# Patient Record
Sex: Female | Born: 1960 | Race: White | Hispanic: No | Smoking: Current every day smoker
Health system: Southern US, Community
[De-identification: ages and names within clinical notes are randomized; demographics above are authoritative.]

## PROBLEM LIST (undated history)

## (undated) DIAGNOSIS — G4730 Sleep apnea, unspecified: Secondary | ICD-10-CM

## (undated) DIAGNOSIS — R519 Headache, unspecified: Secondary | ICD-10-CM

## (undated) DIAGNOSIS — M1712 Unilateral primary osteoarthritis, left knee: Secondary | ICD-10-CM

## (undated) DIAGNOSIS — F32A Depression, unspecified: Secondary | ICD-10-CM

## (undated) DIAGNOSIS — K219 Gastro-esophageal reflux disease without esophagitis: Secondary | ICD-10-CM

## (undated) DIAGNOSIS — I10 Essential (primary) hypertension: Secondary | ICD-10-CM

## (undated) DIAGNOSIS — J45909 Unspecified asthma, uncomplicated: Secondary | ICD-10-CM

## (undated) DIAGNOSIS — F4310 Post-traumatic stress disorder, unspecified: Secondary | ICD-10-CM

## (undated) DIAGNOSIS — G43909 Migraine, unspecified, not intractable, without status migrainosus: Secondary | ICD-10-CM

## (undated) DIAGNOSIS — S8992XA Unspecified injury of left lower leg, initial encounter: Secondary | ICD-10-CM

## (undated) DIAGNOSIS — F329 Major depressive disorder, single episode, unspecified: Secondary | ICD-10-CM

## (undated) DIAGNOSIS — Z8719 Personal history of other diseases of the digestive system: Secondary | ICD-10-CM

## (undated) DIAGNOSIS — F1010 Alcohol abuse, uncomplicated: Secondary | ICD-10-CM

## (undated) DIAGNOSIS — R51 Headache: Secondary | ICD-10-CM

## (undated) DIAGNOSIS — F419 Anxiety disorder, unspecified: Secondary | ICD-10-CM

## (undated) HISTORY — PX: SPLENECTOMY, TOTAL: SHX788

## (undated) HISTORY — PX: BREAST BIOPSY: SHX20

## (undated) HISTORY — PX: OTHER SURGICAL HISTORY: SHX169

## (undated) HISTORY — DX: Headache, unspecified: R51.9

## (undated) HISTORY — DX: Headache: R51

## (undated) HISTORY — DX: Post-traumatic stress disorder, unspecified: F43.10

---
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
## 1982-08-26 HISTORY — PX: TUBAL LIGATION: SHX77

## 2000-08-17 ENCOUNTER — Inpatient Hospital Stay (HOSPITAL_COMMUNITY): Admission: EM | Admit: 2000-08-17 | Discharge: 2000-09-01 | Payer: Self-pay

## 2000-08-17 ENCOUNTER — Encounter (INDEPENDENT_AMBULATORY_CARE_PROVIDER_SITE_OTHER): Payer: Self-pay | Admitting: Specialist

## 2000-08-19 ENCOUNTER — Encounter: Payer: Self-pay | Admitting: Surgery

## 2000-08-24 HISTORY — PX: OTHER SURGICAL HISTORY: SHX169

## 2002-03-23 ENCOUNTER — Emergency Department (HOSPITAL_COMMUNITY): Admission: EM | Admit: 2002-03-23 | Discharge: 2002-03-23 | Payer: Self-pay | Admitting: Emergency Medicine

## 2002-03-23 ENCOUNTER — Encounter: Payer: Self-pay | Admitting: Emergency Medicine

## 2002-03-24 ENCOUNTER — Emergency Department (HOSPITAL_COMMUNITY): Admission: EM | Admit: 2002-03-24 | Discharge: 2002-03-24 | Payer: Self-pay | Admitting: Emergency Medicine

## 2004-09-03 ENCOUNTER — Emergency Department (HOSPITAL_COMMUNITY): Admission: EM | Admit: 2004-09-03 | Discharge: 2004-09-03 | Payer: Self-pay | Admitting: Emergency Medicine

## 2005-07-06 ENCOUNTER — Emergency Department (HOSPITAL_COMMUNITY): Admission: EM | Admit: 2005-07-06 | Discharge: 2005-07-07 | Payer: Self-pay | Admitting: Emergency Medicine

## 2006-01-01 ENCOUNTER — Emergency Department (HOSPITAL_COMMUNITY): Admission: EM | Admit: 2006-01-01 | Discharge: 2006-01-01 | Payer: Self-pay | Admitting: Emergency Medicine

## 2006-01-04 ENCOUNTER — Emergency Department (HOSPITAL_COMMUNITY): Admission: EM | Admit: 2006-01-04 | Discharge: 2006-01-04 | Payer: Self-pay | Admitting: Emergency Medicine

## 2006-08-11 ENCOUNTER — Emergency Department (HOSPITAL_COMMUNITY): Admission: EM | Admit: 2006-08-11 | Discharge: 2006-08-11 | Payer: Self-pay | Admitting: Emergency Medicine

## 2006-11-26 DIAGNOSIS — Z9189 Other specified personal risk factors, not elsewhere classified: Secondary | ICD-10-CM

## 2006-12-02 DIAGNOSIS — T7411XA Adult physical abuse, confirmed, initial encounter: Secondary | ICD-10-CM | POA: Insufficient documentation

## 2007-03-07 ENCOUNTER — Emergency Department (HOSPITAL_COMMUNITY): Admission: EM | Admit: 2007-03-07 | Discharge: 2007-03-07 | Payer: Self-pay | Admitting: Emergency Medicine

## 2007-05-17 ENCOUNTER — Emergency Department (HOSPITAL_COMMUNITY): Admission: EM | Admit: 2007-05-17 | Discharge: 2007-05-17 | Payer: Self-pay | Admitting: Emergency Medicine

## 2007-06-23 ENCOUNTER — Emergency Department (HOSPITAL_COMMUNITY): Admission: EM | Admit: 2007-06-23 | Discharge: 2007-06-23 | Payer: Self-pay | Admitting: Emergency Medicine

## 2007-08-10 ENCOUNTER — Ambulatory Visit: Payer: Self-pay | Admitting: Nurse Practitioner

## 2007-08-10 DIAGNOSIS — F4310 Post-traumatic stress disorder, unspecified: Secondary | ICD-10-CM

## 2007-08-10 DIAGNOSIS — K029 Dental caries, unspecified: Secondary | ICD-10-CM | POA: Insufficient documentation

## 2007-09-01 ENCOUNTER — Ambulatory Visit: Payer: Self-pay | Admitting: Nurse Practitioner

## 2007-09-01 LAB — CONVERTED CEMR LAB
ALT: 12 units/L (ref 0–35)
Basophils Absolute: 0 10*3/uL (ref 0.0–0.1)
Blood in Urine, dipstick: NEGATIVE
CO2: 25 meq/L (ref 19–32)
Calcium: 9.2 mg/dL (ref 8.4–10.5)
Chlamydia, DNA Probe: NEGATIVE
Chloride: 105 meq/L (ref 96–112)
Cholesterol: 187 mg/dL (ref 0–200)
Creatinine, Ser: 0.81 mg/dL (ref 0.40–1.20)
GC Probe Amp, Genital: NEGATIVE
Glucose, Bld: 96 mg/dL (ref 70–99)
Hemoglobin: 12.7 g/dL (ref 12.0–15.0)
Ketones, urine, test strip: NEGATIVE
Lymphocytes Relative: 34 % (ref 12–46)
Lymphs Abs: 2.1 10*3/uL (ref 0.7–4.0)
Monocytes Absolute: 0.7 10*3/uL (ref 0.1–1.0)
Monocytes Relative: 11 % (ref 3–12)
Neutro Abs: 3.3 10*3/uL (ref 1.7–7.7)
Nitrite: NEGATIVE
Protein, U semiquant: NEGATIVE
RBC: 4.11 M/uL (ref 3.87–5.11)
RDW: 13.1 % (ref 11.5–15.5)
Total CHOL/HDL Ratio: 2.4
Total Protein: 7.1 g/dL (ref 6.0–8.3)
Urobilinogen, UA: 0.2
WBC Urine, dipstick: NEGATIVE
WBC: 6.2 10*3/uL (ref 4.0–10.5)

## 2007-09-04 ENCOUNTER — Encounter (INDEPENDENT_AMBULATORY_CARE_PROVIDER_SITE_OTHER): Payer: Self-pay | Admitting: Nurse Practitioner

## 2007-09-07 ENCOUNTER — Ambulatory Visit (HOSPITAL_COMMUNITY): Admission: RE | Admit: 2007-09-07 | Discharge: 2007-09-07 | Payer: Self-pay | Admitting: Internal Medicine

## 2007-09-07 ENCOUNTER — Ambulatory Visit: Payer: Self-pay | Admitting: *Deleted

## 2007-10-31 ENCOUNTER — Ambulatory Visit: Payer: Self-pay | Admitting: Nurse Practitioner

## 2007-11-30 ENCOUNTER — Emergency Department (HOSPITAL_COMMUNITY): Admission: EM | Admit: 2007-11-30 | Discharge: 2007-11-30 | Payer: Self-pay | Admitting: *Deleted

## 2007-12-14 ENCOUNTER — Emergency Department (HOSPITAL_COMMUNITY): Admission: EM | Admit: 2007-12-14 | Discharge: 2007-12-14 | Payer: Self-pay | Admitting: Emergency Medicine

## 2008-03-20 ENCOUNTER — Ambulatory Visit: Payer: Self-pay | Admitting: Nurse Practitioner

## 2008-03-20 DIAGNOSIS — J029 Acute pharyngitis, unspecified: Secondary | ICD-10-CM

## 2009-03-30 ENCOUNTER — Emergency Department (HOSPITAL_COMMUNITY): Admission: EM | Admit: 2009-03-30 | Discharge: 2009-03-30 | Payer: Self-pay | Admitting: Emergency Medicine

## 2009-09-03 ENCOUNTER — Ambulatory Visit: Payer: Self-pay | Admitting: Nurse Practitioner

## 2009-09-03 DIAGNOSIS — N760 Acute vaginitis: Secondary | ICD-10-CM | POA: Insufficient documentation

## 2009-09-03 LAB — CONVERTED CEMR LAB
Chlamydia, DNA Probe: NEGATIVE
GC Probe Amp, Genital: NEGATIVE

## 2009-09-04 ENCOUNTER — Encounter (INDEPENDENT_AMBULATORY_CARE_PROVIDER_SITE_OTHER): Payer: Self-pay | Admitting: Nurse Practitioner

## 2009-09-09 ENCOUNTER — Emergency Department (HOSPITAL_COMMUNITY): Admission: EM | Admit: 2009-09-09 | Discharge: 2009-09-09 | Payer: Self-pay | Admitting: Emergency Medicine

## 2009-10-29 ENCOUNTER — Ambulatory Visit: Payer: Self-pay | Admitting: Physician Assistant

## 2009-10-30 ENCOUNTER — Telehealth: Payer: Self-pay | Admitting: Physician Assistant

## 2009-12-15 ENCOUNTER — Telehealth (INDEPENDENT_AMBULATORY_CARE_PROVIDER_SITE_OTHER): Payer: Self-pay | Admitting: Nurse Practitioner

## 2010-01-13 ENCOUNTER — Ambulatory Visit (HOSPITAL_COMMUNITY)
Admission: RE | Admit: 2010-01-13 | Discharge: 2010-01-13 | Payer: Self-pay | Source: Home / Self Care | Attending: Nurse Practitioner | Admitting: Nurse Practitioner

## 2010-01-13 ENCOUNTER — Ambulatory Visit: Payer: Self-pay | Admitting: Nurse Practitioner

## 2010-01-13 DIAGNOSIS — M25569 Pain in unspecified knee: Secondary | ICD-10-CM

## 2010-01-14 ENCOUNTER — Telehealth (INDEPENDENT_AMBULATORY_CARE_PROVIDER_SITE_OTHER): Payer: Self-pay | Admitting: Nurse Practitioner

## 2010-02-04 ENCOUNTER — Telehealth (INDEPENDENT_AMBULATORY_CARE_PROVIDER_SITE_OTHER): Payer: Self-pay | Admitting: Nurse Practitioner

## 2010-02-13 ENCOUNTER — Encounter (INDEPENDENT_AMBULATORY_CARE_PROVIDER_SITE_OTHER): Payer: Self-pay | Admitting: Nurse Practitioner

## 2010-02-13 ENCOUNTER — Ambulatory Visit
Admission: RE | Admit: 2010-02-13 | Discharge: 2010-02-13 | Payer: Self-pay | Source: Home / Self Care | Attending: Nurse Practitioner | Admitting: Nurse Practitioner

## 2010-02-13 DIAGNOSIS — J329 Chronic sinusitis, unspecified: Secondary | ICD-10-CM | POA: Insufficient documentation

## 2010-02-20 ENCOUNTER — Telehealth (INDEPENDENT_AMBULATORY_CARE_PROVIDER_SITE_OTHER): Payer: Self-pay | Admitting: Nurse Practitioner

## 2010-02-24 NOTE — Progress Notes (Signed)
Summary: Still having a lot of pain  Phone Note Call from Patient Call back at [phone]   Caller: Patient Summary of Call: PT IS STILL IN ALOT OF PAIN WANTED TO KNOW IF THEY CAN CALL SOMETHING IN FOR HER [facility] [address] Initial call taken by: Oscar La,  October 30, 2009 11:28 AM  Follow-up for Phone Call        Where is the pain?  Sore throat? Tereso Newcomer PA-C  October 30, 2009 1:01 PM  The pain is all over -- thinks it's the flu.  Having headaches, throat pain, having trouble sleeping due to body pain.  Has to go back to work and is taking 3 ibuprofen at a time, giving little relief.  Would like a return call if something is called in.   Dutch Quint RN  October 30, 2009 4:49 PM   Additional Follow-up for Phone Call Additional follow up Details #1::        Naprosyn sent to [facility]. She can take it two times a day with food. Do not take with ibuprofen. She can take Tylenol [phone] mg by mouth every 6 hours as needed with the naprosyn. Drink plenty of fluids. Take all of the antibiotics. Get plenty of rest. It will take her 7-10 days to feel better. Schedule appt if she is getting worse.  Additional Follow-up by: Tereso Newcomer PA-C,  October 30, 2009 5:18 PM    Additional Follow-up for Phone Call Additional follow up Details #2::    Left message on answering machine for pt. to return call.  Dutch Quint RN  October 31, 2009 12:27 PM  Left message on answering machine for pt. to return call.  Dutch Quint RN  November 04, 2009 12:03 PM  Left message on answering machine for pt. to return call.  Dutch Quint RN  November 05, 2009 12:23 PM  Feeling better, symptoms resolving.  Notified of Rx and provider's instructions.  Dutch Quint RN  November 06, 2009 11:07 AM   New/Updated Medications: NAPROSYN 500 MG TABS (NAPROXEN) Take 1 tablet by mouth two times a day with food as needed for pain Prescriptions: NAPROSYN 500 MG TABS (NAPROXEN) Take 1 tablet by mouth two  times a day with food as needed for pain  #30 x 0   Entered and Authorized by:   Tereso Newcomer PA-C   Signed by:   Tereso Newcomer PA-C on 10/30/2009   Method used:   Electronically to        [facility] [phone]* (retail)       [address]       [place], [place]  [address]       Ph: [phone]       Fax: [phone]   RxID:   [id number]
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
## 2010-02-24 NOTE — Assessment & Plan Note (Signed)
Summary: Acute - Vaginitis   Vital Signs:  Patient profile:   50 year old female LMP:     2009-09-06 Weight:      196.0 pounds BMI:     32.73 Temp:     97.7 degrees F oral Pulse rate:   51 / minute Pulse rhythm:   regular Resp:     16 per minute BP sitting:   107 / 74  (left arm) Cuff size:   regular  Vitals Entered By: Levon Hedger (September 03, 2009 4:13 PM)  Nutrition Counseling: Patient's BMI is greater than 25 and therefore counseled on weight management options. CC: wantds to be tested for STD....has a rash in between legs with alot of pain and burning Is Patient Diabetic? No Pain Assessment Patient in pain? no       Does patient need assistance? Functional Status Self care Ambulation Normal LMP (date): 2009/09/06 LMP - Character: light     Enter LMP: 06-Sep-2009 Last PAP Result negative   CC:  wantds to be tested for STD....has a rash in between legs with alot of pain and burning.  History of Present Illness:  Pt into the office for f/u on rash on her vaginal area. Pt reports that she has been exposed on two separate occasions with irritants that may have caused a rash Symtpoms started 3 days ago No vaginal discharge only burning and irritations Pt reports that she went to in the woods for toileting while she was helping a friend work.  Also she used some "lavender" body wash that her daughter had in the home.  She is unable to use soap as it is a know irritant.  Social - pt has not been to this office since 02/2008. She reports she was helping to care for her father who deceased in 09/06/2008 and then his wife who later died.  Pt is a recovering addict and admits that following the death of her father she "slipped" but now she is clean and sober again. Admits to some promiscuous behavior and is requesting STD testing  Allergies (verified): No Known Drug Allergies  Review of Systems CV:  Denies chest pain or discomfort. Resp:  Denies cough. GI:  Denies abdominal pain,  nausea, and vomiting. GU:  vaginal irritation.  Physical Exam  General:  alert.   Head:  normocephalic.   Lungs:  normal breath sounds.   Heart:  normal rate and regular rhythm.   Genitalia:  bilateral inner thighs - upper  macular rash vagina - normal examination Msk:  up to the exam table Neurologic:  alert & oriented X3.     Impression & Recommendations:  Problem # 1:  VAGINITIS (ICD-616.10) advised pt not to use body wash in vaginal area area is self resolving  advised pt that she can apply hydrocortisone cream if needed  Problem # 2:  SCREENING EXAMINATION FOR VENEREAL DISEASE (ICD-V74.5) per pt request  she does also need PAP Orders: T- GC Chlamydia (40347)  Patient Instructions: 1)  Schedule an appointment for a complete physical exam 2)  Come fasting after midnight before this visit. 3)  Vaginal irritation - most likely from body wash that you used. Avoid the body wash.  This should clear in a few days.  4)  If you would like to apply something you can use hydrocortisone cream which can purchased over the counter

## 2010-02-24 NOTE — Progress Notes (Signed)
Summary: NEED TO SPEAK WITH THE NURSE  Phone Note Call from Patient   Summary of Call: PATIENT HAS APP ON 01-28-10 TO SEE N.MARTIN SHE IS HAVING PROBLEMS WITH DIZZINESS   AND ALL SPIN AROUD AND SHE NEEDS TO TALK TO THE NURSE TO SEE WHAT SHE CAN DOO. Initial call taken by: Domenic Polite,  December 15, 2009 11:41 AM  Follow-up for Phone Call        Daughter said pt. is out running errands and call her on her cell phone [phone]. Called Left message on answer machine for pt. to return call. Gaylyn Cheers RN  December 15, 2009 12:29 PM   Left message on answer machine for pt. to return call. Gaylyn Cheers RN  December 16, 2009 10:26 AM       Additional Follow-up for Phone Call Additional follow up Details #1::        Last Wed. when she got out of bed lightheaded and dizzy, stumbled to bathrm. Lasted app 1hr. Has reoccured daily since then. Feels congested, denies any drainage, no fever no sore throat, no HA, no hx of BP problems however normally runs low. Advised to check BP @ drug store  runs fine.  If over 150/90 or lower than 90/50 call and we can have her come for BP check. If BP is ok then try an OTC decongestant. Saline nasal spray, hot showere with water directed @ sinus areas. Call if develops a fever feels worse.  Additional Follow-up by: Gaylyn Cheers RN,  December 16, 2009 11:18 AM

## 2010-02-24 NOTE — Letter (Signed)
Summary: *HSN Results Follow up  [facility]  [address] [place], [place] [address]   Phone: [phone]  Fax: [phone]      09/04/2009   Meghan Bowman [address] [place], [place]  [address]   Dear  Ms. Meghan Bowman,                            ____S.Drinkard,FNP   ____D. Gore,FNP       ____B. McPherson,MD   ____V. Rankins,MD    ____E. Mulberry,MD    _X___N. Daphine Deutscher, FNP  ____D. Reche [place], MD    ____K. Philipp Deputy, MD    ____Other     This letter is to inform you that your recent test(s):  _______Pap Smear    ___X____Lab Test     _______X-ray    ___X____ is within acceptable limits  _______ requires a medication change  _______ requires a follow-up lab visit  _______ requires a follow-up visit with your provider   Comments: Labs done during recent office visit were normal.       _________________________________________________________ If you have any questions, please contact our office [phone].                    Sincerely,    Lehman Prom FNP [facility]

## 2010-02-26 NOTE — Letter (Signed)
Summary: Handout Printed  Printed Handout:  - Sinusitis 

## 2010-02-26 NOTE — Progress Notes (Signed)
Summary: X-ray results  Phone Note Call from Patient   Summary of Call: notify pt that her x-ray shows pseudogout which is formation of salt crystals in the joints. symptoms should be controlled with ibuprofen as ordered during last visit. (will send a note about dx) may also benefit from aspiration of fluid if it gets more extreme Initial call taken by: Lehman Prom FNP,  January 14, 2010 5:49 PM  Follow-up for Phone Call        Left message on answering machine for pt to call back......Marland KitchenArmenia Shannon  January 16, 2010 10:13 AM    pt wants to know can she have antiinflammatory med.Marland KitchenMarland KitchenArmenia Shannon  January 16, 2010 11:22 AM   spoke with Daphine Deutscher and she let me know that is what the ibuprofen... [place] Shannon  January 16, 2010 11:57 AM
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
## 2010-02-27 ENCOUNTER — Encounter (INDEPENDENT_AMBULATORY_CARE_PROVIDER_SITE_OTHER): Payer: Self-pay | Admitting: Nurse Practitioner

## 2010-03-02 ENCOUNTER — Telehealth (INDEPENDENT_AMBULATORY_CARE_PROVIDER_SITE_OTHER): Payer: Self-pay | Admitting: Nurse Practitioner
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
## 2010-03-04 NOTE — Letter (Signed)
Summary: Generic Letter  [facility]  [address] [place], [place] [address]   Phone: [phone]  Fax: [phone]    02/27/2010  Tameria Labo [address] [place], [place]  [address]  Dear Ms. Jerene Pitch,  We have been unable to contact you by telephone.  Please call our office, at your earliest convenience, so that we may speak with you.   Sincerely,   Dutch Quint RN

## 2010-03-12 NOTE — Progress Notes (Signed)
Summary: F/U Anxiety  Phone Note Call from Patient Call back at  [phone]   Summary of Call: PT HAD TO CANCEL CPP FOR TODAY BECAUSE HER CARD EXPIRED AND SHE DID NOT HAVE THE MONEY TO BE SEEN/ WILL CALL BACK IN MARCH.SHE WANTED [facility] TO KNOW SHE WAS DOING BETTER,SHE IS WORKING WITH THERAPIST TO GET  ON MEDS/NERVES ARE ALITTLE BETTER SHE HAS LEARN TO SLOW DOWN.SHE APPRECIATES ALL THAT YOU  DO FOR HER/PHONE NUMBERS DAUGHTER=[phone] HER NUMBER [phone] Initial call taken by: Arta Bruce,  March 02, 2010 8:53 AM  Follow-up for Phone Call        Sent to N. Daphine Deutscher for [employer].  Dutch Quint RN  March 02, 2010 11:49 AM   Additional Follow-up for Phone Call Additional follow up Details #1::        great to hear that pt is doing better. I'll be glad to see her for a CPE once her card is renewed Additional Follow-up by: Lehman Prom FNP,  March 02, 2010 1:20 PM

## 2010-04-09 LAB — DIFFERENTIAL
Basophils Relative: 1 % (ref 0–1)
Eosinophils Absolute: 0.3 10*3/uL (ref 0.0–0.7)
Neutrophils Relative %: 46 % (ref 43–77)

## 2010-04-09 LAB — URINALYSIS, ROUTINE W REFLEX MICROSCOPIC
Bilirubin Urine: NEGATIVE
Leukocytes, UA: NEGATIVE
Nitrite: NEGATIVE
Urobilinogen, UA: 0.2 mg/dL (ref 0.0–1.0)

## 2010-04-09 LAB — CBC
MCH: 31.3 pg (ref 26.0–34.0)
MCHC: 33.8 g/dL (ref 30.0–36.0)
Platelets: 249 10*3/uL (ref 150–400)

## 2010-04-09 LAB — BASIC METABOLIC PANEL
CO2: 23 mEq/L (ref 19–32)
Calcium: 8.9 mg/dL (ref 8.4–10.5)
Creatinine, Ser: 0.75 mg/dL (ref 0.4–1.2)
Glucose, Bld: 123 mg/dL — ABNORMAL HIGH (ref 70–99)

## 2010-04-09 LAB — URINE MICROSCOPIC-ADD ON

## 2010-04-09 LAB — POCT CARDIAC MARKERS
CKMB, poc: <1 ng/mL — ABNORMAL LOW (ref 1.0–8.0)
Myoglobin, poc: 35.1 ng/mL (ref 12–200)
Troponin i, poc: <0.05 ng/mL (ref 0.00–0.09)

## 2010-04-17 LAB — COMPREHENSIVE METABOLIC PANEL
Albumin: 4.3 g/dL (ref 3.5–5.2)
Alkaline Phosphatase: 61 U/L (ref 39–117)
BUN: 8 mg/dL (ref 6–23)
Calcium: 9 mg/dL (ref 8.4–10.5)
Glucose, Bld: 118 mg/dL — ABNORMAL HIGH (ref 70–99)
Potassium: 4.3 mEq/L (ref 3.5–5.1)
Sodium: 137 mEq/L (ref 135–145)
Total Protein: 7.5 g/dL (ref 6.0–8.3)

## 2010-04-17 LAB — CBC
HCT: 40.5 % (ref 36.0–46.0)
Hemoglobin: 13.7 g/dL (ref 12.0–15.0)
MCHC: 33.8 g/dL (ref 30.0–36.0)
Platelets: 351 10*3/uL (ref 150–400)
RDW: 13.4 % (ref 11.5–15.5)

## 2010-04-17 LAB — URINE MICROSCOPIC-ADD ON

## 2010-04-17 LAB — RAPID URINE DRUG SCREEN, HOSP PERFORMED
Benzodiazepines: NOT DETECTED
Cocaine: POSITIVE — AB
Opiates: NOT DETECTED

## 2010-04-17 LAB — DIFFERENTIAL
Lymphs Abs: 2.5 10*3/uL (ref 0.7–4.0)
Monocytes Absolute: 0.5 10*3/uL (ref 0.1–1.0)
Monocytes Relative: 6 % (ref 3–12)
Neutro Abs: 5.2 10*3/uL (ref 1.7–7.7)
Neutrophils Relative %: 60 % (ref 43–77)

## 2010-04-17 LAB — URINALYSIS, ROUTINE W REFLEX MICROSCOPIC
Glucose, UA: NEGATIVE mg/dL
Protein, ur: 30 mg/dL — AB
pH: 5 (ref 5.0–8.0)

## 2010-04-17 LAB — ETHANOL: Alcohol, Ethyl (B): 77 mg/dL — ABNORMAL HIGH (ref 0–10)

## 2010-05-04 ENCOUNTER — Other Ambulatory Visit (HOSPITAL_COMMUNITY): Payer: Self-pay | Admitting: Internal Medicine

## 2010-05-04 DIAGNOSIS — Z1231 Encounter for screening mammogram for malignant neoplasm of breast: Secondary | ICD-10-CM

## 2010-05-05 ENCOUNTER — Ambulatory Visit (HOSPITAL_COMMUNITY)
Admission: RE | Admit: 2010-05-05 | Discharge: 2010-05-05 | Disposition: A | Discharge status: Home / Self Care | Payer: Medicaid Other | Source: Ambulatory Visit | Attending: Internal Medicine | Admitting: Internal Medicine

## 2010-05-05 DIAGNOSIS — Z1231 Encounter for screening mammogram for malignant neoplasm of breast: Secondary | ICD-10-CM | POA: Insufficient documentation
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
## 2010-06-12 NOTE — Op Note (Signed)
[facility]  Patient:    Meghan Bowman, Meghan Bowman                       MRN: [id number] Proc. Date: 08/24/00 Adm. Date:  [id number] Attending:  Katha Cabal CC:         Vania Rea. Jarold Motto, M.D. [facility]   Operative Report  PREOPERATIVE DIAGNOSIS:  Abscess in distal ileum.  POSTOPERATIVE DIAGNOSIS:  Abscess in distal ileum.  PROCEDURE:  Ileal cecectomy.  SURGEON:  Thornton Park. Daphine Deutscher, M.D.  ASSISTANT:  Chevis Pretty, M.D.  ANESTHESIA:  General endotracheal.  FINDINGS:  Possible ileal perforation with partial small-bowel obstruction.  DESCRIPTION OF PROCEDURE:  The patient was taken to room #1 on the afternoon of August 24, 2000 and given general anesthesia.  She has been on Primaxin. Abdomen was prepped with Betadine and draped sterilely.  A Foley was inserted. A midline incision was made and it went through her old incision from her previous splenectomy.  I explored her abdomen and went up and found her ligament of Treitz and ran the small bowel down distally to where it was bound into this abscess.  I broke into the abscess, cultured the material and suctioned this out and mobilized the distal ileum.  It appeared to be bound in on an interloop abscess and this was mobilized along with the cecum.  There was no frank pus that was really encountered but there was an old abscess cavity ______  was drained out with my suction tip but there really was no spillage.  After mobilizing this and mobilizing the right colon, I found the appendix and it looked normal and there was no evidence of a ruptured appendix.  I went ahead and divided the ascending colon and went through the mesentery, putting clamps in the mesentery and oversewing with 2-0 silk ties. The terminal ileum was not involved and the abscess was divided and then the two stapled ends were placed together and a functional end-to-end anastomosis was created using the 7.5 linear stapler and the common  defect was closed with a TA-60.  Good healthy anastomosis was present.  Prior to closure, I did inspect and there was no active bleeding.  Good patent ileoascending anastomosis was present.  Mesentery was essentially closed, since I had resected right along the bowel.  Area was irrigated copiously with saline. About 400 cc of blood had been lost in the case.  A JP drain was placed in this ascending colonic abscess gutter and was brought out through a separate stab incision in the right lower quadrant.  Sponge and needle counts were reported as correct.  The wound was then closed with a running #1 Prolene from above and below and double antibiotic solution was used and then wound was stapled.  The patient tolerated the procedure well and was taken to recovery room in satisfactory condition. DD:  08/24/00 TD:  08/25/00 Job: [id number]

## 2010-06-12 NOTE — Discharge Summary (Signed)
[facility]  Patient:    Meghan Bowman, Meghan Bowman                       MRN: [id number] Proc. Date: 09/01/00 Adm. Date:  [id number] Disc. Date: 09/01/00 Attending:  Katha Cabal CC:         Vania Rea. Jarold Motto, M.D. [facility]   Discharge Summary  ADMITTING DIAGNOSIS:  Inflammatory mass in ileum.  DISCHARGE DIAGNOSIS:  Probable perforation of ileum, questionable foreign body.  PROCEDURE:  Laparotomy with resection of small bowel and primary anastomosis on August 24, 2000.  HOSPITAL COURSE:  Ms. Fodge was admitted and through the ER was found on CT scan to have this thickened area of the distal ileum surrounded with gas, evidence of a perforation.  Dr. Jarold Motto saw her regarding whether this could possibly be inflammatory bowel disease and did not feel that that was the case.  She was treated with Unasyn and got better, ultimately switching over to Primaxin. She cooled down but because this had been persistent, we elected to go to the operating room on July 31.  There, terminal ileum and a cecectomy was performed taking out her appendix and cecum and the terminal ileum.  A primary anastomosis was performed.  Postoperatively she did reasonably well and was ready to be discharged on postoperative day eight.  DISCHARGE MEDICATION/FOLLOW-UP:  She was given Tylox (#30) to take for pain and will be followed up in the office in about 2-3 weeks. DD:  09/01/00 TD:  09/01/00 Job: [id number]

## 2010-09-18 ENCOUNTER — Emergency Department (HOSPITAL_COMMUNITY): Payer: Medicaid Other

## 2010-09-18 ENCOUNTER — Emergency Department (HOSPITAL_COMMUNITY)
Admission: EM | Admit: 2010-09-18 | Discharge: 2010-09-18 | Disposition: A | Discharge status: Home / Self Care | Payer: Medicaid Other | Attending: Emergency Medicine | Admitting: Emergency Medicine

## 2010-09-18 DIAGNOSIS — M545 Low back pain, unspecified: Secondary | ICD-10-CM | POA: Insufficient documentation

## 2010-09-18 DIAGNOSIS — M25559 Pain in unspecified hip: Secondary | ICD-10-CM | POA: Insufficient documentation

## 2010-10-06 ENCOUNTER — Emergency Department (HOSPITAL_COMMUNITY)
Admission: EM | Admit: 2010-10-06 | Discharge: 2010-10-06 | Disposition: A | Discharge status: Home / Self Care | Payer: Medicaid Other | Attending: Emergency Medicine | Admitting: Emergency Medicine

## 2010-10-06 DIAGNOSIS — M79609 Pain in unspecified limb: Secondary | ICD-10-CM | POA: Insufficient documentation

## 2010-10-06 DIAGNOSIS — M545 Low back pain, unspecified: Secondary | ICD-10-CM | POA: Insufficient documentation

## 2010-10-06 DIAGNOSIS — L089 Local infection of the skin and subcutaneous tissue, unspecified: Secondary | ICD-10-CM | POA: Insufficient documentation

## 2010-10-27 LAB — POCT I-STAT, CHEM 8
BUN: 7
Calcium, Ion: 1.16
Chloride: 105
Creatinine, Ser: 0.9
Glucose, Bld: 99
HCT: 36
Potassium: 3.9

## 2010-11-09 LAB — URINALYSIS, ROUTINE W REFLEX MICROSCOPIC
Glucose, UA: NEGATIVE
Hgb urine dipstick: NEGATIVE
Protein, ur: NEGATIVE
Specific Gravity, Urine: 1.037 — ABNORMAL HIGH
pH: 5.5

## 2011-01-21 ENCOUNTER — Emergency Department (HOSPITAL_COMMUNITY)
Admission: EM | Admit: 2011-01-21 | Discharge: 2011-01-21 | Disposition: A | Discharge status: Home / Self Care | Payer: Medicaid Other | Source: Home / Self Care | Attending: Emergency Medicine | Admitting: Emergency Medicine

## 2011-01-21 DIAGNOSIS — Z2089 Contact with and (suspected) exposure to other communicable diseases: Secondary | ICD-10-CM

## 2011-01-21 MED ORDER — PERMETHRIN 5 % EX CREA: TOPICAL_CREAM | CUTANEOUS | Status: AC

## 2011-01-21 MED ORDER — HYDROXYZINE HCL 25 MG PO TABS: 25.0000 mg | ORAL_TABLET | Freq: Four times a day (QID) | ORAL | Status: AC

## 2011-01-21 NOTE — ED Notes (Signed)
C/o itchy rash all over, including scalp for more than a week.  States other family members diagnosed with scabies.

## 2011-01-21 NOTE — ED Provider Notes (Signed)
History     CSN: [id number]  Arrival date & time 01/21/11  1641   First MD Initiated Contact with Patient 01/21/11 1643      Chief Complaint  Patient presents with  . Rash    (Consider location/radiation/quality/duration/timing/severity/associated sxs/prior treatment) HPI Comments: For about 1 week, have itchiness almost everywhere, have this itchy rash on my stomach , my groin and my chest"  Patient is a 50 y.o. female presenting with rash. The history is provided by the patient.  Rash  This is a new problem. The current episode started yesterday. The problem has not changed since onset.There has been no fever. The rash is present on the back and torso. The pain is mild. Associated symptoms include itching. Pertinent negatives include no blisters, no pain and no weeping.    History reviewed. No pertinent past medical history.  Past Surgical History  Procedure Date  . Splenectomy, total     No family history on file.  History  Substance Use Topics  . Smoking status: Never Smoker   . Smokeless tobacco: Not on file  . Alcohol Use: Yes     social    OB History    Grav Para Term Preterm Abortions TAB SAB Ect Mult Living                  Review of Systems  Constitutional: Negative for fever and fatigue.  Musculoskeletal: Positive for gait problem.  Skin: Positive for itching and rash.    Allergies  Review of patient's allergies indicates no known allergies.  Home Medications   Current Outpatient Rx  Name Route Sig Dispense Refill  . HYDROXYZINE HCL 25 MG PO TABS Oral Take 1 tablet (25 mg total) by mouth every 6 (six) hours. 12 tablet 0  . PERMETHRIN 5 % EX CREA  Apply to affected area once and leave on for 8 hours repeat treatment in 14 days 60 g 0    BP 137/89  Pulse 92  Temp(Src) 98.7 F (37.1 C) (Oral)  Resp 16  SpO2 99%  Physical Exam  Nursing note and vitals reviewed. Constitutional: She appears well-developed and well-nourished.  Skin: Rash  noted. No abrasion noted. Rash is papular. She is not diaphoretic. There is erythema.       ED Course  Procedures (including critical care time)  Labs Reviewed - No data to display No results found.   1. Scabies exposure       MDM  Pruritic-papular eruption +scabies contact        Jimmie Molly, MD 01/21/11 [phone]

## 2011-02-10 ENCOUNTER — Ambulatory Visit: Payer: Medicaid Other | Attending: Nurse Practitioner | Admitting: Physical Therapy

## 2011-02-10 DIAGNOSIS — IMO0001 Reserved for inherently not codable concepts without codable children: Secondary | ICD-10-CM | POA: Insufficient documentation

## 2011-02-10 DIAGNOSIS — M545 Low back pain, unspecified: Secondary | ICD-10-CM | POA: Insufficient documentation

## 2011-02-15 ENCOUNTER — Ambulatory Visit: Payer: Medicaid Other | Admitting: Physical Therapy

## 2011-02-17 ENCOUNTER — Encounter: Payer: No Typology Code available for payment source | Admitting: Physical Therapy

## 2011-02-22 ENCOUNTER — Encounter: Payer: No Typology Code available for payment source | Admitting: Physical Therapy

## 2011-02-25 ENCOUNTER — Ambulatory Visit: Payer: Medicaid Other | Admitting: Physical Therapy

## 2011-03-03 ENCOUNTER — Encounter: Payer: No Typology Code available for payment source | Admitting: Physical Therapy

## 2011-03-05 ENCOUNTER — Encounter: Payer: No Typology Code available for payment source | Admitting: Physical Therapy

## 2011-03-08 ENCOUNTER — Encounter: Payer: No Typology Code available for payment source | Admitting: Physical Therapy

## 2011-03-11 ENCOUNTER — Encounter: Payer: No Typology Code available for payment source | Admitting: Physical Therapy

## 2011-07-05 ENCOUNTER — Encounter (HOSPITAL_COMMUNITY): Payer: Self-pay | Admitting: Emergency Medicine

## 2011-07-05 ENCOUNTER — Emergency Department (HOSPITAL_COMMUNITY)
Admission: EM | Admit: 2011-07-05 | Discharge: 2011-07-05 | Disposition: A | Discharge status: Home / Self Care | Payer: Medicaid Other | Source: Home / Self Care | Attending: Emergency Medicine | Admitting: Emergency Medicine

## 2011-07-05 ENCOUNTER — Emergency Department (HOSPITAL_COMMUNITY)
Admission: EM | Admit: 2011-07-05 | Discharge: 2011-07-06 | Disposition: A | Discharge status: Home / Self Care | Payer: Medicaid Other | Attending: Emergency Medicine | Admitting: Emergency Medicine

## 2011-07-05 ENCOUNTER — Encounter (HOSPITAL_COMMUNITY): Payer: Self-pay | Admitting: *Deleted

## 2011-07-05 DIAGNOSIS — K047 Periapical abscess without sinus: Secondary | ICD-10-CM

## 2011-07-05 DIAGNOSIS — F329 Major depressive disorder, single episode, unspecified: Secondary | ICD-10-CM | POA: Insufficient documentation

## 2011-07-05 DIAGNOSIS — F411 Generalized anxiety disorder: Secondary | ICD-10-CM | POA: Insufficient documentation

## 2011-07-05 DIAGNOSIS — L03211 Cellulitis of face: Secondary | ICD-10-CM | POA: Insufficient documentation

## 2011-07-05 DIAGNOSIS — F3289 Other specified depressive episodes: Secondary | ICD-10-CM | POA: Insufficient documentation

## 2011-07-05 DIAGNOSIS — F341 Dysthymic disorder: Secondary | ICD-10-CM | POA: Insufficient documentation

## 2011-07-05 DIAGNOSIS — K089 Disorder of teeth and supporting structures, unspecified: Secondary | ICD-10-CM | POA: Insufficient documentation

## 2011-07-05 DIAGNOSIS — F17200 Nicotine dependence, unspecified, uncomplicated: Secondary | ICD-10-CM | POA: Insufficient documentation

## 2011-07-05 DIAGNOSIS — L0201 Cutaneous abscess of face: Secondary | ICD-10-CM | POA: Insufficient documentation

## 2011-07-05 HISTORY — DX: Major depressive disorder, single episode, unspecified: F32.9

## 2011-07-05 HISTORY — DX: Depression, unspecified: F32.A

## 2011-07-05 HISTORY — DX: Anxiety disorder, unspecified: F41.9

## 2011-07-05 MED ORDER — PENICILLIN V POTASSIUM 500 MG PO TABS: 500.0000 mg | ORAL_TABLET | Freq: Four times a day (QID) | ORAL | Status: AC

## 2011-07-05 MED ORDER — IBUPROFEN 800 MG PO TABS: 800.0000 mg | ORAL_TABLET | Freq: Three times a day (TID) | ORAL | Status: AC | PRN

## 2011-07-05 MED ORDER — HYDROCODONE-ACETAMINOPHEN 5-325 MG PO TABS: 1.0000 | ORAL_TABLET | ORAL | Status: AC | PRN

## 2011-07-05 MED ORDER — HYDROCODONE-ACETAMINOPHEN 5-325 MG PO TABS
1.0000 | ORAL_TABLET | Freq: Once | ORAL | Status: AC
  Administered 2011-07-05: 1 via ORAL
  Filled 2011-07-05: qty 1

## 2011-07-05 NOTE — ED Provider Notes (Signed)
History     CSN: [id number]  Arrival date & time 07/05/11  4098   First MD Initiated Contact with Patient 07/05/11 [phone]      Chief Complaint  Patient presents with  . Dental Pain    (Consider location/radiation/quality/duration/timing/severity/associated sxs/prior treatment) HPI Comments: Patient reports she was seen by her dentist 4 days ago for a cleaning and xrays - was told at the time that she had a cavity (right lower 3rd molar) and had a temporary filling placed though she was told the tooth would need to be pulled or have a root canal done.  The next day, patient developed throbbing pain over her right lower jaw.  Over the next two days, the pain worsened and she began having swelling and redness over the area.  Overnight she developed subjective fevers and a sore throat. Has taken ibuprofen with mild relief.  Pain is 7/10 intensity, worse with eating and drinking.  Denies any difficulty swallowing or breathing.  Pt notes that this is the second time she has gotten an infection after visiting her dentist, would like referral to new dentist.    The history is provided by the patient.    Past Medical History  Diagnosis Date  . Depression   . Anxiety     Past Surgical History  Procedure Date  . Splenectomy, total   . Knee surgery     No family history on file.  History  Substance Use Topics  . Smoking status: Current Everyday Smoker  . Smokeless tobacco: Not on file  . Alcohol Use: Yes     social    OB History    Grav Para Term Preterm Abortions TAB SAB Ect Mult Living                  Review of Systems  Constitutional: Positive for fever.  HENT: Positive for sore throat and dental problem. Negative for trouble swallowing.   Respiratory: Negative for choking, shortness of breath and stridor.     Allergies  Morphine and related  Home Medications   Current Outpatient Rx  Name Route Sig Dispense Refill  . IBUPROFEN 200 MG PO TABS Oral Take 600 mg by  mouth every 6 (six) hours as needed. For pain    . SERTRALINE HCL 25 MG PO TABS Oral Take 50 mg by mouth every evening.      BP 135/97  Pulse 84  Temp 98.7 F (37.1 C)  Resp 16  SpO2 97%  Physical Exam  Nursing note and vitals reviewed. Constitutional: She appears well-developed and well-nourished.  HENT:  Head: Normocephalic and atraumatic.  Mouth/Throat: Uvula is midline. Mucous membranes are not dry. No uvula swelling. No oropharyngeal exudate, posterior oropharyngeal edema, posterior oropharyngeal erythema or tonsillar abscesses.         Multiple teeth missing, mostly molars.  Right lower jaw with rounded area of induration, tender to palpation, overlying mild erythema.  Gingiva of right lower jaw edematous, erythematous, tender.  No discharge.    Neck: Neck supple.  Pulmonary/Chest: Effort normal.  Neurological: She is alert.    ED Course  Procedures (including critical care time)  Labs Reviewed - No data to display No results found.   1. Dental abscess       MDM  Patient with recent visit to the dentist, soon after developing right lower jaw/gingival pain and swelling around a tooth that is known to need a root canal or be pulled.  Pt with apparent dental  abscess.  Afebrile here.  Airway widely patent.  Pt d/c home with penicillin, vicodin #20, and ibuprofen.  Dental follow up, return precautions given.  Pt advised to maintain hydration.  Patient verbalizes understanding and agrees with plan.          Dillard Cannon [place], [place] 07/05/11 [phone]

## 2011-07-05 NOTE — ED Notes (Signed)
Discharge out of Computer by Oliver Hum and seen by [employer]

## 2011-07-05 NOTE — ED Provider Notes (Signed)
Medical screening examination/treatment/procedure(s) were performed by non-physician practitioner and as supervising physician I was immediately available for consultation/collaboration.  Ethelda Chick, MD 07/05/11 1012

## 2011-07-05 NOTE — ED Notes (Signed)
The pt was seen here earlier today and diagnosed with an abscessed tooth.  She has had  More swelling in her rt neck.  This started after she had her teeth cleaned and one of the instruments slipped and cut her gum and that's when the pain started

## 2011-07-05 NOTE — ED Notes (Signed)
Rt jaw swelling since Thursday saw a  Dentist on Thursday and had her teeth cleaned and  Then her lower jaw started to swell

## 2011-07-05 NOTE — Discharge Instructions (Signed)
Read the information below.  Please call your dentist or the dentist listed above today to schedule a close follow up appointment.  If you develop high fevers unresponsive to tylenol or ibuprofen, difficulty swallowing or breathing, or inability to tolerate fluids by mouth, return to the ER immediately for a recheck.  You may return to the ER at any time for worsening condition or any new symptoms that concern you.   Dental Abscess A dental abscess usually starts from an infected tooth. Antibiotic medicine and pain pills can be helpful, but dental infections require the attention of a dentist. Rinse around the infected area often with salt water (a pinch of salt in 8 oz of warm water). Do not apply heat to the outside of your face. See your dentist or oral surgeon as soon as possible.  SEEK IMMEDIATE MEDICAL CARE IF:  You have increasing, severe pain that is not relieved by medicine.   You or your child has an oral temperature above 102 F (38.9 C), not controlled by medicine.   Your baby is older than 3 months with a rectal temperature of 102 F (38.9 C) or higher.   Your baby is 81 months old or younger with a rectal temperature of 100.4 F (38 C) or higher.   You develop chills, severe headache, difficulty breathing, or trouble swallowing.   You have swelling in the neck or around the eye.  Document Released: 01/11/2005 Document Revised: 12/31/2010 Document Reviewed: 06/22/2006 [facility] Patient Information 2012 [place], [place].  Dental Assistance If the dentist on-call cannot see you, please use the resources below:   Patients with Medicaid: [facility] [phone] [address], [phone] [address], [phone]  If unable to pay, or uninsured, contact [facility] [phone]) or [facility] Department [phone] in [place], [phone] in [facility]) to become qualified for the adult dental clinic  Other Low-Cost Community Dental  Services: [facility]- [address] Natasha Bence [place], [place], [address]    [phone], Ext. [phone]    2nd and 4th Thursday of the month at 6:30am    10 clients each day by appointment, can sometimes see walk-in     patients if someone does not show for an appointment [facility]- [address] Ether Griffins [place], [place], [address]    [phone] [facility] [address], [place], [place], [address]    [phone]  [facility] Health Department- [phone] [facility] Health Department- [phone] [facility] Department- [phone]

## 2011-07-06 ENCOUNTER — Emergency Department (HOSPITAL_COMMUNITY): Payer: Medicaid Other

## 2011-07-06 LAB — CBC
HCT: 37.5 % (ref 36.0–46.0)
MCV: 90.6 fL (ref 78.0–100.0)
RBC: 4.14 MIL/uL (ref 3.87–5.11)
RDW: 12.6 % (ref 11.5–15.5)
WBC: 13.4 10*3/uL — ABNORMAL HIGH (ref 4.0–10.5)

## 2011-07-06 LAB — POCT I-STAT, CHEM 8
BUN: 7 mg/dL (ref 6–23)
Calcium, Ion: 1.19 mmol/L (ref 1.12–1.32)
Chloride: 101 mEq/L (ref 96–112)
Creatinine, Ser: 0.6 mg/dL (ref 0.50–1.10)
Sodium: 139 mEq/L (ref 135–145)

## 2011-07-06 LAB — DIFFERENTIAL
Basophils Absolute: 0 10*3/uL (ref 0.0–0.1)
Lymphocytes Relative: 22 % (ref 12–46)
Lymphs Abs: 3 10*3/uL (ref 0.7–4.0)
Monocytes Absolute: 1.1 10*3/uL — ABNORMAL HIGH (ref 0.1–1.0)
Neutro Abs: 9.1 10*3/uL — ABNORMAL HIGH (ref 1.7–7.7)

## 2011-07-06 MED ORDER — OXYCODONE-ACETAMINOPHEN 5-325 MG PO TABS: 1.0000 | ORAL_TABLET | ORAL | Status: AC | PRN

## 2011-07-06 MED ORDER — CLINDAMYCIN HCL 150 MG PO CAPS: 300.0000 mg | ORAL_CAPSULE | Freq: Three times a day (TID) | ORAL | Status: AC

## 2011-07-06 MED ORDER — HYDROMORPHONE HCL PF 1 MG/ML IJ SOLN
1.0000 mg | Freq: Once | INTRAMUSCULAR | Status: AC
  Administered 2011-07-06: 1 mg via INTRAVENOUS
  Filled 2011-07-06: qty 1

## 2011-07-06 MED ORDER — IOHEXOL 300 MG/ML  SOLN
75.0000 mL | Freq: Once | INTRAMUSCULAR | Status: AC | PRN
  Administered 2011-07-06: 75 mL via INTRAVENOUS

## 2011-07-06 MED ORDER — NAPROXEN 500 MG PO TABS: 500.0000 mg | ORAL_TABLET | Freq: Two times a day (BID) | ORAL | Status: DC

## 2011-07-06 MED ORDER — ONDANSETRON HCL 4 MG/2ML IJ SOLN
4.0000 mg | Freq: Once | INTRAMUSCULAR | Status: AC
  Administered 2011-07-06: 4 mg via INTRAVENOUS
  Filled 2011-07-06: qty 2

## 2011-07-06 MED ORDER — SODIUM CHLORIDE 0.9 % IV SOLN
Freq: Once | INTRAVENOUS | Status: AC
  Administered 2011-07-06: 02:00:00 via INTRAVENOUS

## 2011-07-06 MED ORDER — CLINDAMYCIN PHOSPHATE 900 MG/50ML IV SOLN
900.0000 mg | Freq: Once | INTRAVENOUS | Status: AC
  Administered 2011-07-06: 900 mg via INTRAVENOUS
  Filled 2011-07-06: qty 50

## 2011-07-06 NOTE — ED Notes (Signed)
Pt left w/o RN going over d/c instructions

## 2011-07-06 NOTE — ED Notes (Signed)
Pt had teeth cleaned on Thursday, and reports that instrument slipped and cut her gums in R lower jaw; pt started to have swelling to R side of face, and down R neck; pt was here in AM for same, and reports that she started to have difficulty swallowing and pain has gotten worse

## 2011-07-06 NOTE — ED Notes (Signed)
Pt off monitor to go to restroom.

## 2011-07-06 NOTE — ED Notes (Signed)
AIDET performed. 

## 2011-07-06 NOTE — ED Provider Notes (Signed)
History     CSN: [id number]  Arrival date & time 07/05/11  2221   First MD Initiated Contact with Patient 07/06/11 0119      Chief Complaint  Patient presents with  . neck swelling     (Consider location/radiation/quality/duration/timing/severity/associated sxs/prior treatment) HPI Comments: 51 year old female with a history of dental pain which started several days ago but became worse in the last 24 hours. She states that on Thursday she had a cleaning of her teeth, she did have some damage to her gums during the cleaning and felt like the pain has been getting worse since that time.  She denies fevers or chills but states that since her visit earlier in the day after being treated with penicillin she has had increased pain and swelling with spreading redness across the lower face and onto the neck. This pain is persistent, gradually getting worse, severe and worse with opening her mouth. She has had some difficulty swallowing and has to turn her head to the left side to swallow. There is been no change in her voice including no hoarseness or difficulty breathing.  The history is provided by the patient and medical records.    Past Medical History  Diagnosis Date  . Depression   . Anxiety     Past Surgical History  Procedure Date  . Splenectomy, total   . Knee surgery     No family history on file.  History  Substance Use Topics  . Smoking status: Current Everyday Smoker  . Smokeless tobacco: Not on file  . Alcohol Use: Yes     social    OB History    Grav Para Term Preterm Abortions TAB SAB Ect Mult Living                  Review of Systems  All other systems reviewed and are negative.    Allergies  Morphine and related  Home Medications   Current Outpatient Rx  Name Route Sig Dispense Refill  . HYDROCODONE-ACETAMINOPHEN 5-325 MG PO TABS Oral Take 1-2 tablets by mouth every 4 (four) hours as needed for pain. 20 tablet 0  . IBUPROFEN 800 MG PO TABS Oral  Take 1 tablet (800 mg total) by mouth every 8 (eight) hours as needed for pain. 21 tablet 0  . PENICILLIN V POTASSIUM 500 MG PO TABS Oral Take 1 tablet (500 mg total) by mouth 4 (four) times daily. 40 tablet 0  . SERTRALINE HCL 100 MG PO TABS Oral Take 200 mg by mouth daily.    Marland Kitchen CLINDAMYCIN HCL 150 MG PO CAPS Oral Take 2 capsules (300 mg total) by mouth 3 (three) times daily. May dispense as 150mg  capsules 60 capsule 0  . NAPROXEN 500 MG PO TABS Oral Take 1 tablet (500 mg total) by mouth 2 (two) times daily with a meal. 30 tablet 0  . OXYCODONE-ACETAMINOPHEN 5-325 MG PO TABS Oral Take 1 tablet by mouth every 4 (four) hours as needed for pain. May take 2 tablets PO q 6 hours for severe pain - Do not take with Tylenol as this tablet already contains tylenol 15 tablet 0    BP 102/61  Pulse 68  Temp(Src) 97.5 F (36.4 C) (Oral)  Resp 18  SpO2 96%  Physical Exam  Nursing note and vitals reviewed. Constitutional: She appears well-developed and well-nourished. No distress.  HENT:  Head: Normocephalic and atraumatic.       Right lower jaw with erythema, swelling, tenderness and mild  trismus. The oropharynx is difficult to visualize secondary to the trismus however palpation along the right lower gums show significant tenderness, swelling and induration.  There is lymphadenopathy in the right anterior cervical chain. There is erythema that spreads from the jaw on to the neck. There is mild tenderness to palpation underneath the tongue on the right side of the lower jaw  Eyes: Conjunctivae and EOM are normal. Pupils are equal, round, and reactive to light. Right eye exhibits no discharge. Left eye exhibits no discharge. No scleral icterus.  Neck: Normal range of motion. Neck supple. No JVD present. No thyromegaly present.  Cardiovascular: Normal rate, regular rhythm, normal heart sounds and intact distal pulses.  Exam reveals no gallop and no friction rub.   No murmur heard. Pulmonary/Chest: Effort  normal and breath sounds normal. No respiratory distress. She has no wheezes. She has no rales.  Abdominal: Soft. Bowel sounds are normal. She exhibits no distension and no mass. There is no tenderness.  Musculoskeletal: Normal range of motion. She exhibits no edema and no tenderness.  Lymphadenopathy:    She has no cervical adenopathy.  Neurological: She is alert. Coordination normal.  Skin: Skin is warm and dry. No rash noted. No erythema.  Psychiatric: She has a normal mood and affect. Her behavior is normal.    ED Course  Procedures (including critical care time)  Labs Reviewed  CBC - Abnormal; Notable for the following:    WBC 13.4 (*)    All other components within normal limits  DIFFERENTIAL - Abnormal; Notable for the following:    Neutro Abs 9.1 (*)    Monocytes Absolute 1.1 (*)    All other components within normal limits  POCT I-STAT, CHEM 8 - Abnormal; Notable for the following:    Glucose, Bld 114 (*)    All other components within normal limits   Ct Soft Tissue Neck W Contrast  07/06/2011  *RADIOLOGY REPORT*  Clinical Data: Right neck swelling  CT NECK WITH CONTRAST  Technique:  Multidetector CT imaging of the neck was performed with intravenous contrast.  Contrast: 75mL OMNIPAQUE IOHEXOL 300 MG/ML  SOLN  Comparison: 11/30/2007 cervical spine CT  Findings: There is superficial swelling/stranding of the subcutaneous tissues and platysma of the right neck.  Reactive sized associated lymph nodes.  No loculated fluid collection to suggest abscess.  Lung apices are clear.  Limited images through the posterior fossa show no acute abnormality.  Unremarkable nasal cavity and nasopharynx, oral cavity and oropharynx, hypopharynx, and larynx.  Normal caliber vasculature.  Poor dentition and periapical lucency of the remaining right molar. Otherwise, no acute osseous finding.  IMPRESSION:  Subcutaneous fat stranding of the right neck.  No loculated collection to suggest abscess.  There is  poor dentition and periapical lucency of the remaining right molar.  Correlate with odontogenic examination as a possible infectious source.  Original Report Authenticated By: Waneta Martins, M.D.     1. Cellulitis of right jaw       MDM  The patient is afebrile with a normal pulse, normal blood pressure and normal oxygen saturations with no change in her voice. There is concern secondary to the increased swelling of her neck. CT scan with contrast ordered to evaluate size of the abscess, intravenous clindamycin ordered, pain medication.  Dentist - Dr. Gerald Stabs (sp?) Oral Surgeon - Dr. Manson Passey  Pt informed of results, no abscess cavity seen, Clina started, pt has improvement in trismus on my exam, feels comfortable going  home - PO trial successul prior to d/c.   Symptoms improved, patient has been ambulatory, tolerating fluids, CT scan report shared with the patient and will followup with dentist.  Vida Roller, MD 07/06/11 [phone]

## 2011-07-06 NOTE — Discharge Instructions (Signed)
Your CT scan shows that you do not have an abscess that you have an infection of the soft tissues around her gums and jaw. We have started you on a medication called clindamycin, please take this as prescribed, call your dentist today to arrange a followup visit for 24 hours to be reevaluated. Please see the exact CT scan reading below and share this with your dentist.  Ct Soft Tissue Neck W Contrast  07/06/2011  *RADIOLOGY REPORT*  Clinical Data: Right neck swelling  CT NECK WITH CONTRAST  Technique:  Multidetector CT imaging of the neck was performed with intravenous contrast.  Contrast: 75mL OMNIPAQUE IOHEXOL 300 MG/ML  SOLN  Comparison: 11/30/2007 cervical spine CT  Findings: There is superficial swelling/stranding of the subcutaneous tissues and platysma of the right neck.  Reactive sized associated lymph nodes.  No loculated fluid collection to suggest abscess.  Lung apices are clear.  Limited images through the posterior fossa show no acute abnormality.  Unremarkable nasal cavity and nasopharynx, oral cavity and oropharynx, hypopharynx, and larynx.  Normal caliber vasculature.  Poor dentition and periapical lucency of the remaining right molar. Otherwise, no acute osseous finding.  IMPRESSION:  Subcutaneous fat stranding of the right neck.  No loculated collection to suggest abscess.  There is poor dentition and periapical lucency of the remaining right molar.  Correlate with odontogenic examination as a possible infectious source.  Original Report Authenticated By: Waneta Martins, M.D.

## 2011-07-06 NOTE — ED Notes (Signed)
Pt placed back on monitor and given drink per Dr. Hyacinth Meeker.

## 2011-07-06 NOTE — ED Notes (Signed)
Pt off monitor to use restroom. CT arrived to transport.

## 2011-08-02 ENCOUNTER — Encounter (HOSPITAL_COMMUNITY): Payer: Self-pay | Admitting: Emergency Medicine

## 2011-08-02 ENCOUNTER — Emergency Department (INDEPENDENT_AMBULATORY_CARE_PROVIDER_SITE_OTHER): Payer: Medicaid Other

## 2011-08-02 ENCOUNTER — Emergency Department (HOSPITAL_COMMUNITY)
Admission: EM | Admit: 2011-08-02 | Discharge: 2011-08-02 | Disposition: A | Discharge status: Home / Self Care | Payer: Medicaid Other | Source: Home / Self Care | Attending: Emergency Medicine | Admitting: Emergency Medicine

## 2011-08-02 DIAGNOSIS — M25469 Effusion, unspecified knee: Secondary | ICD-10-CM

## 2011-08-02 DIAGNOSIS — S8010XA Contusion of unspecified lower leg, initial encounter: Secondary | ICD-10-CM

## 2011-08-02 DIAGNOSIS — S8012XA Contusion of left lower leg, initial encounter: Secondary | ICD-10-CM

## 2011-08-02 DIAGNOSIS — M25462 Effusion, left knee: Secondary | ICD-10-CM

## 2011-08-02 HISTORY — DX: Unspecified injury of left lower leg, initial encounter: S89.92XA

## 2011-08-02 MED ORDER — HYDROCODONE-ACETAMINOPHEN 5-325 MG PO TABS: 2.0000 | ORAL_TABLET | ORAL | Status: DC | PRN

## 2011-08-02 MED ORDER — MELOXICAM 15 MG PO TABS: 15.0000 mg | ORAL_TABLET | Freq: Every day | ORAL | Status: DC
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
## 2011-08-18 ENCOUNTER — Telehealth (HOSPITAL_COMMUNITY): Payer: Self-pay | Admitting: *Deleted

## 2011-08-18 NOTE — ED Notes (Signed)
Pt. called on VM and said she lost her d/c papers. She wants to know the name of the doctor she is supposed to have a f/u.  I could not understand her last name on the VM and could not find her chart. I called pt. back and she said she found her paper work and has appt. scheduled for Fri. @ 1030 with Sports Medicine and Orthopedics.

## 2011-08-20 ENCOUNTER — Ambulatory Visit (INDEPENDENT_AMBULATORY_CARE_PROVIDER_SITE_OTHER): Payer: Medicaid Other | Admitting: Sports Medicine

## 2011-08-20 ENCOUNTER — Encounter: Payer: Self-pay | Admitting: Sports Medicine

## 2011-08-20 VITALS — BP 126/86

## 2011-08-20 DIAGNOSIS — M25569 Pain in unspecified knee: Secondary | ICD-10-CM

## 2011-08-20 MED ORDER — MELOXICAM 15 MG PO TABS: 15.0000 mg | ORAL_TABLET | Freq: Every day | ORAL | Status: DC

## 2011-08-20 MED ORDER — HYDROCODONE-ACETAMINOPHEN 5-325 MG PO TABS: 2.0000 | ORAL_TABLET | ORAL | Status: AC | PRN

## 2011-08-20 NOTE — Patient Instructions (Addendum)
Thank you for coming in today. Please take the meloxicam daily  (in place of ibuprofen) Take hydrocodone as needed.  Please come back and see me after your MRI.  Wear the hinged brace and use crutches as needed.  We will know exactly what we are dealing with after the MRI.   [facility] TUE 7.30.13 AT 10A ARRIVE IN ADMITTING AT 945A

## 2011-08-20 NOTE — Assessment & Plan Note (Addendum)
New problem at sports clinic with uncertain diagnosis and further workup planned. Additionally I independently reviewed her old records including her visit to urgent care recently  Knee pain in the setting of acute injury complicated by pre-existing arthritic changes. Patient has knee pain with effusion and mechanical symptoms.  I suspect meniscal injury, however a significant amount of her pain could be secondary to arthritis. Discussed treatment options. Patient elects to proceed directly to MRI, with potential followup for corticosteroid injection versus other options. Additionally discontinued knee immobilizer and fitted A JJ. hinged knee brace.  Additionally represcribed meloxicam and 40 tablets of Vicodin. We'll followup one day after the MRI to go over the test results and discuss further treatment options.
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
## 2011-08-24 ENCOUNTER — Ambulatory Visit (HOSPITAL_COMMUNITY)
Admission: RE | Admit: 2011-08-24 | Discharge: 2011-08-24 | Disposition: A | Discharge status: Home / Self Care | Payer: Medicaid Other | Source: Ambulatory Visit | Attending: Sports Medicine | Admitting: Sports Medicine

## 2011-08-24 DIAGNOSIS — X500XXA Overexertion from strenuous movement or load, initial encounter: Secondary | ICD-10-CM | POA: Insufficient documentation

## 2011-08-24 DIAGNOSIS — S83419A Sprain of medial collateral ligament of unspecified knee, initial encounter: Secondary | ICD-10-CM | POA: Insufficient documentation

## 2011-08-24 DIAGNOSIS — M25469 Effusion, unspecified knee: Secondary | ICD-10-CM | POA: Insufficient documentation

## 2011-08-24 DIAGNOSIS — M1710 Unilateral primary osteoarthritis, unspecified knee: Secondary | ICD-10-CM | POA: Insufficient documentation

## 2011-08-24 DIAGNOSIS — M25569 Pain in unspecified knee: Secondary | ICD-10-CM

## 2011-08-24 DIAGNOSIS — IMO0002 Reserved for concepts with insufficient information to code with codable children: Secondary | ICD-10-CM | POA: Insufficient documentation

## 2011-08-24 DIAGNOSIS — R609 Edema, unspecified: Secondary | ICD-10-CM | POA: Insufficient documentation

## 2011-08-25 ENCOUNTER — Ambulatory Visit (INDEPENDENT_AMBULATORY_CARE_PROVIDER_SITE_OTHER): Payer: Medicaid Other | Admitting: Sports Medicine

## 2011-08-25 DIAGNOSIS — S83419A Sprain of medial collateral ligament of unspecified knee, initial encounter: Secondary | ICD-10-CM

## 2011-08-25 DIAGNOSIS — M1710 Unilateral primary osteoarthritis, unspecified knee: Secondary | ICD-10-CM

## 2011-08-25 MED ORDER — MELOXICAM 15 MG PO TABS: 40.0000 mg | ORAL_TABLET | Freq: Every day | ORAL | Status: DC

## 2011-08-25 NOTE — Progress Notes (Signed)
  Subjective:    Patient ID: Meghan Bowman, female    DOB: 1960/07/20, 51 y.o.   MRN: [id number]  HPI Pt that comes today for left knee pain follow up. She has done MRI that showed   1. Maceration of the body of the lateral meniscus, essentially nonexistent. Severe lateral compartment osteoarthritis. 2. Mild to moderate medial and patellofemoral osteoarthritis. 3. Medial collateral ligament sprain with synovitis along the deep surface of the ligament adjacent to the medial femoral condyle. The pain is moderated to intense with walking. She is taking Mobic since prior visit and has experienced some improvement, pt declines narcotic pain treatment at this time as she is a recovering alcoholic  Review of Systems     Objective:   Physical Exam Constitutional : NAD.  MSK: Left knee: Unchanged since previous visit.  Large scar on the lateral and medial side of the knee well healed. Range of motion is -5-110. Trace effusion. She is tender to palpation along the course of the MCL, but good stability with valgus stressing. Slight tenderness to palpation along the lateral joint line. 1+ patellofemoral crepitus. She is neurovascular intact distally. Walking with a mild limp  MRI is as above       Assessment & Plan:  1. Left knee pain secondary to MCL sprain with underlying advanced tricompartmental DJD( posttraumatic)   I reviewed her MRI scan. Although she has a macerated lateral meniscus, this is in the setting of severe underlying osteoarthritis which does not make her an ideal candidate for arthroscopy. We discussed the merits of an intra-articular cortisone injection, but she wants to hold on that for now. She will continue with her meloxicam and we will convert her J&J brace to a simple pull on neoprene knee sleeve. She'll start physical therapy and will followup with me in 4 weeks. If symptoms persist, we will reconsider merits of cortisone injection. Of note, patient does not want any narcotic  pain meds as she is a recovering alcoholic currently attending Alcoholics Anonymous.

## 2011-08-31 ENCOUNTER — Emergency Department (HOSPITAL_COMMUNITY)
Admission: EM | Admit: 2011-08-31 | Discharge: 2011-08-31 | Disposition: A | Discharge status: Home / Self Care | Payer: Medicaid Other | Source: Home / Self Care | Attending: Emergency Medicine | Admitting: Emergency Medicine

## 2011-08-31 ENCOUNTER — Encounter (HOSPITAL_COMMUNITY): Payer: Self-pay

## 2011-08-31 DIAGNOSIS — J45909 Unspecified asthma, uncomplicated: Secondary | ICD-10-CM

## 2011-08-31 HISTORY — DX: Alcohol abuse, uncomplicated: F10.10

## 2011-08-31 MED ORDER — IPRATROPIUM BROMIDE 0.02 % IN SOLN
0.5000 mg | Freq: Once | RESPIRATORY_TRACT | Status: AC
  Administered 2011-08-31: 0.5 mg via RESPIRATORY_TRACT

## 2011-08-31 MED ORDER — ALBUTEROL SULFATE HFA 108 (90 BASE) MCG/ACT IN AERS: 1.0000 | INHALATION_SPRAY | Freq: Four times a day (QID) | RESPIRATORY_TRACT | Status: DC | PRN

## 2011-08-31 MED ORDER — METHYLPREDNISOLONE SODIUM SUCC 125 MG IJ SOLR
125.0000 mg | Freq: Once | INTRAMUSCULAR | Status: AC
  Administered 2011-08-31: 125 mg via INTRAMUSCULAR

## 2011-08-31 MED ORDER — ALBUTEROL SULFATE (5 MG/ML) 0.5% IN NEBU
INHALATION_SOLUTION | RESPIRATORY_TRACT | Status: AC
  Filled 2011-08-31: qty 1

## 2011-08-31 MED ORDER — PREDNISONE 5 MG PO KIT: 1.0000 | PACK | Freq: Every day | ORAL | Status: DC

## 2011-08-31 MED ORDER — BECLOMETHASONE DIPROPIONATE 80 MCG/ACT IN AERS: 2.0000 | INHALATION_SPRAY | Freq: Two times a day (BID) | RESPIRATORY_TRACT | Status: DC

## 2011-08-31 MED ORDER — ALBUTEROL SULFATE (5 MG/ML) 0.5% IN NEBU
5.0000 mg | INHALATION_SOLUTION | Freq: Once | RESPIRATORY_TRACT | Status: AC
  Administered 2011-08-31: 5 mg via RESPIRATORY_TRACT

## 2011-08-31 MED ORDER — METHYLPREDNISOLONE SODIUM SUCC 125 MG IJ SOLR
INTRAMUSCULAR | Status: AC
  Filled 2011-08-31: qty 2

## 2011-08-31 NOTE — ED Provider Notes (Signed)
Chief Complaint  Patient presents with  . Asthma    History of Present Illness:   Meghan Bowman is a 51 year old female who has a history of asthma since she was in her 77s. She's been maintained on albuterol on a when necessary basis. [facility] since [facility] she can get her medicine refill. About 2 days ago she was exposed to some mold in the car and since then she's been short of breath, as noted tightness in her chest, and wheezing. She has a slight dry cough. She denies any fever or chills. She's also had some rhinorrhea and postnasal drip. She gets short of breath with exertion and does have some nighttime symptoms as well.  Review of Systems:  Other than noted above, the patient denies any of the following symptoms. Systemic:  No fever, chills, sweats, fatigue, myalgias, headache, weight loss or anorexia. Eye:  No redness, itching, or drainage. ENT:  No earache, ear congestion, nasal congestion, sneezing, rhinorrhea, sinus pressure, sinus pain, post nasal drip, or sore throat. Lungs:  No cough, sputum production, or shortness of breath. No chest pain. GI:  No indigestion, heartburn, abdominal pain, nausea, or vomiting. Skin:  No rash or itching.  PMFSH:  Past medical history, family history, social history, meds, and allergies were reviewed.  No history of allergic rhinitis.  No use of tobacco.  Physical Exam:   Vital signs:  BP 149/91  Pulse 60  Temp 98.2 F (36.8 C) (Oral)  SpO2 98% General:  Alert, in no distress. Eye:  No conjunctival injection or drainage. Lids were normal. ENT:  TMs and canals were normal, without erythema or inflammation.  Nasal mucosa was clear and uncongested, without drainage.  Mucous membranes were moist.  Pharynx was clear, without exudate or drainage.  There were no oral ulcerations or lesions. Neck:  Supple, no adenopathy, tenderness or mass. Lungs:  No respiratory distress.  Lungs were clear to auscultation, without wheezes,  rales or rhonchi.  Breath sounds were clear and equal bilaterally. Heart:  Regular rhythm, without gallops, murmers or rubs. Skin:  Clear, warm, and dry, without rash or lesions.  Course in Urgent Care Center:   She was given a DuoNeb breathing treatment and Solu-Medrol 125 mg IM and tolerated these well without any immediate side effects. She stated she felt better after the treatment., Seemed to her lungs they sound is about the same both before and after the treatment and she did not have any wheezing and good air movement.  Assessment:  The encounter diagnosis was Asthma.  Plan:   1.  The following meds were prescribed:   New Prescriptions   ALBUTEROL (PROVENTIL HFA;VENTOLIN HFA) 108 (90 BASE) MCG/ACT INHALER    Inhale 1-2 puffs into the lungs every 6 (six) hours as needed for wheezing.   BECLOMETHASONE (QVAR) 80 MCG/ACT INHALER    Inhale 2 puffs into the lungs 2 (two) times daily.   PREDNISONE 5 MG KIT    Take 1 kit (5 mg total) by mouth daily after breakfast. Prednisone 5 mg 6 day dosepack.  Take as directed.   2.  The patient was instructed in symptomatic care and handouts were given. 3.  The patient was told to return if becoming worse in any way, if no better in 3 or 4 days, and given some red flag symptoms that would indicate earlier return.  Follow up:  The patient was told to follow up with a new primary care doctor soon  she can get this arranged.     Reuben Likes, MD 08/31/11 2141

## 2011-08-31 NOTE — ED Notes (Signed)
SOB for for 2 days; out of her MDI (albuterol) for about 3-4 months; able to speak in complete sentences; audible wheezing

## 2011-09-03 ENCOUNTER — Emergency Department (HOSPITAL_COMMUNITY)
Admission: EM | Admit: 2011-09-03 | Discharge: 2011-09-03 | Discharge status: Against Medical Advice | Payer: Medicaid Other | Attending: Emergency Medicine | Admitting: Emergency Medicine

## 2011-09-03 ENCOUNTER — Encounter (HOSPITAL_COMMUNITY): Payer: Self-pay

## 2011-09-03 ENCOUNTER — Emergency Department (INDEPENDENT_AMBULATORY_CARE_PROVIDER_SITE_OTHER): Payer: Medicaid Other

## 2011-09-03 ENCOUNTER — Emergency Department (HOSPITAL_COMMUNITY)
Admission: EM | Admit: 2011-09-03 | Discharge: 2011-09-03 | Disposition: A | Discharge status: Home / Self Care | Payer: Medicaid Other | Source: Home / Self Care | Attending: Emergency Medicine | Admitting: Emergency Medicine

## 2011-09-03 ENCOUNTER — Emergency Department (HOSPITAL_COMMUNITY): Payer: Medicaid Other

## 2011-09-03 DIAGNOSIS — J45909 Unspecified asthma, uncomplicated: Secondary | ICD-10-CM

## 2011-09-03 DIAGNOSIS — Z0389 Encounter for observation for other suspected diseases and conditions ruled out: Secondary | ICD-10-CM | POA: Insufficient documentation

## 2011-09-03 LAB — POCT I-STAT, CHEM 8
Calcium, Ion: 1.17 mmol/L (ref 1.12–1.23)
Chloride: 107 mEq/L (ref 96–112)
Glucose, Bld: 99 mg/dL (ref 70–99)
HCT: 38 % (ref 36.0–46.0)
Hemoglobin: 12.9 g/dL (ref 12.0–15.0)
Potassium: 4 mEq/L (ref 3.5–5.1)

## 2011-09-03 LAB — URINALYSIS, ROUTINE W REFLEX MICROSCOPIC
Nitrite: NEGATIVE
Protein, ur: NEGATIVE mg/dL
Specific Gravity, Urine: 1.004 — ABNORMAL LOW (ref 1.005–1.030)
Urobilinogen, UA: 0.2 mg/dL (ref 0.0–1.0)

## 2011-09-03 LAB — PREGNANCY, URINE: Preg Test, Ur: NEGATIVE

## 2011-09-03 LAB — CBC
Platelets: 269 10*3/uL (ref 150–400)
RBC: 4.07 MIL/uL (ref 3.87–5.11)
RDW: 13.4 % (ref 11.5–15.5)
WBC: 9.4 10*3/uL (ref 4.0–10.5)

## 2011-09-03 LAB — POCT I-STAT TROPONIN I

## 2011-09-03 MED ORDER — IPRATROPIUM BROMIDE 0.02 % IN SOLN
0.5000 mg | Freq: Once | RESPIRATORY_TRACT | Status: AC
  Administered 2011-09-03: 0.5 mg via RESPIRATORY_TRACT
  Filled 2011-09-03: qty 2.5

## 2011-09-03 MED ORDER — ALBUTEROL SULFATE (5 MG/ML) 0.5% IN NEBU
2.5000 mg | INHALATION_SOLUTION | Freq: Once | RESPIRATORY_TRACT | Status: DC
  Filled 2011-09-03: qty 0.5

## 2011-09-03 MED ORDER — ALBUTEROL SULFATE (5 MG/ML) 0.5% IN NEBU
5.0000 mg | INHALATION_SOLUTION | Freq: Once | RESPIRATORY_TRACT | Status: AC
  Administered 2011-09-03: 5 mg via RESPIRATORY_TRACT

## 2011-09-03 NOTE — ED Notes (Addendum)
Had lower wisdom tooth extracted x 2 days ago. Also, been having asthma attacks, sob, and cp - tightness. Taking amoxicillin x 2 days s/p tooth extraction.

## 2011-09-03 NOTE — ED Provider Notes (Signed)
Chief Complaint  Patient presents with  . Shortness of Breath    History of Present Illness:   Mrs. Meghan Bowman is a 51 year old female who was here 2 days ago with a flare up of asthma. She states that this started 3 days before then with exposure to some mold, social and going on about 5 days in all. She describes chest tightness, particularly on the right, cough productive of white sputum, and has felt dizzy. She describes wheezing and shortness of breath. She has symptoms both daytime and nighttime and at times she's been unable to sleep well because of nighttime symptoms. She had oral surgery done 2 days ago and is somewhat well without any complications or side effects. She was sent home on albuterol and Qvar. She only started taking these yesterday. She was also placed on a prednisone taper, but unfortunately she started taking the prednisone taper at the low end of the dose drainage. She states she feels no better today. When she was here last she received Solu-Medrol and a DuoNeb breathing treatment.  Review of Systems:  Other than noted above, the patient denies any of the following symptoms. Systemic:  No fever, chills, sweats, fatigue, myalgias, headache, weight loss or anorexia. Eye:  No redness, itching, or drainage. ENT:  No earache, ear congestion, nasal congestion, sneezing, rhinorrhea, sinus pressure, sinus pain, post nasal drip, or sore throat. Lungs:  No cough, sputum production, or shortness of breath. No chest pain. GI:  No indigestion, heartburn, abdominal pain, nausea, or vomiting. Skin:  No rash or itching.  PMFSH:  Past medical history, family history, social history, meds, and allergies were reviewed.  No history of allergic rhinitis.  No use of tobacco.  Physical Exam:   Vital signs:  BP 135/86  Pulse 73  Temp 97.9 F (36.6 C) (Oral)  Resp 24  SpO2 99% General:  Alert, in no distress. Eye:  No conjunctival injection or drainage. Lids were normal. ENT:  TMs and canals  were normal, without erythema or inflammation.  Nasal mucosa was clear and uncongested, without drainage.  Mucous membranes were moist.  Pharynx was clear, without exudate or drainage.  There were no oral ulcerations or lesions. Neck:  Supple, no adenopathy, tenderness or mass. Lungs:  No respiratory distress.  Lungs were clear to auscultation, without wheezes, rales or rhonchi.  Breath sounds were clear and equal bilaterally. Heart:  Regular rhythm, without gallops, murmers or rubs. Skin:  Clear, warm, and dry, without rash or lesions.   Date: 09/03/2011  Rate: 62  Rhythm: normal sinus rhythm  QRS Axis: normal  Intervals: normal  ST/T Wave abnormalities: normal  Conduction Disutrbances:none  Narrative Interpretation: Normal sinus rhythm, normal EKG.  Old EKG Reviewed: none available  Radiology:  Dg Chest 2 View  09/03/2011  *RADIOLOGY REPORT*  Clinical Data: Shortness of breath, chest tightness.  CHEST - 2 VIEW  Comparison: 09/09/2009  Findings: Heart and mediastinal contours are within normal limits. No focal opacities or effusions.  No acute bony abnormality.  IMPRESSION: No active cardiopulmonary disease.  Original Report Authenticated By: Cyndie Chime, M.D.    Assessment:  The encounter diagnosis was Asthma. The patient has ongoing symptoms of asthma, she states the albuterol is not working and home treatments do not seem to be working. Nevertheless, today she appears in no respiratory distress, her lungs are completely clear without any wheezing and she has good air movement bilaterally. Her O2 sats are 100. I offered to give her another breathing  treatment and steroid injection, but she felt she needed to go to the hospital for further treatment, so we will transport her via shuttle.  Plan:   1.  The following meds were prescribed:   New Prescriptions   No medications on file   2.  The patient was transported to the emergency department via shuttle.   Reuben Likes,  MD 09/03/11 [phone]

## 2011-09-03 NOTE — ED Notes (Signed)
Reports she never got over her SOB issues from 8-6 visit; did not start PO steroids until yesterday, and started on low end of schedule instead of upper end of pill schedule

## 2011-09-03 NOTE — ED Notes (Signed)
Pt states she is feeling better since her breathing tx and is leaving. Encouraged pt to stay and delay for treatment room explained to pt.

## 2011-09-13 ENCOUNTER — Ambulatory Visit: Payer: Medicaid Other | Attending: Sports Medicine

## 2011-09-15 ENCOUNTER — Encounter (HOSPITAL_COMMUNITY): Payer: Self-pay

## 2011-09-15 ENCOUNTER — Emergency Department (HOSPITAL_COMMUNITY)
Admission: EM | Admit: 2011-09-15 | Discharge: 2011-09-15 | Disposition: A | Discharge status: Home / Self Care | Payer: Medicaid Other | Source: Home / Self Care | Attending: Family Medicine | Admitting: Family Medicine

## 2011-09-15 DIAGNOSIS — R05 Cough: Secondary | ICD-10-CM

## 2011-09-15 MED ORDER — AZITHROMYCIN 250 MG PO TABS: 250.0000 mg | ORAL_TABLET | Freq: Every day | ORAL | Status: AC

## 2011-09-15 MED ORDER — BENZONATATE 100 MG PO CAPS: 100.0000 mg | ORAL_CAPSULE | Freq: Three times a day (TID) | ORAL | Status: AC

## 2011-09-15 MED ORDER — CETIRIZINE HCL 10 MG PO TABS: 10.0000 mg | ORAL_TABLET | Freq: Every day | ORAL | Status: DC

## 2011-09-15 MED ORDER — OMEPRAZOLE 20 MG PO CPDR: DELAYED_RELEASE_CAPSULE | ORAL | Status: DC
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
## 2011-09-15 NOTE — ED Notes (Addendum)
Known breathing issues; had  Been cleaning in her home, and found green mold growing; has used her MDI several times today; anxious on arrival, scattered wheezing on ascultation, better after a few minutes of rest and sips of cool fluids; w/d/color good.  Able to speak in long, complete sentences w/o observable difficulty

## 2011-09-16 ENCOUNTER — Ambulatory Visit (INDEPENDENT_AMBULATORY_CARE_PROVIDER_SITE_OTHER): Payer: Medicaid Other | Admitting: Sports Medicine

## 2011-09-16 ENCOUNTER — Telehealth (HOSPITAL_COMMUNITY): Payer: Self-pay | Admitting: *Deleted

## 2011-09-16 VITALS — BP 124/78 | Ht 66.0 in | Wt 201.0 lb

## 2011-09-16 DIAGNOSIS — M25569 Pain in unspecified knee: Secondary | ICD-10-CM

## 2011-09-16 DIAGNOSIS — IMO0002 Reserved for concepts with insufficient information to code with codable children: Secondary | ICD-10-CM

## 2011-09-16 DIAGNOSIS — M1710 Unilateral primary osteoarthritis, unspecified knee: Secondary | ICD-10-CM

## 2011-09-16 NOTE — Progress Notes (Signed)
  Subjective:    Patient ID: Meghan Bowman, female    DOB: Dec 31, 1960, 51 y.o.   MRN: [id number]  HPI  51 year old F with subacute on chronic left knee pain. She underwent an MRI on 08/24/11 that demonstrated 1. Maceration of the body of the lateral meniscus, essentially nonexistent. Severe lateral compartment osteoarthritis. 2. Mild to moderate medial and patellofemoral osteoarthritis. 3. Medial collateral ligament sprain with synovitis along the deep surface of the ligament adjacent to the medial femoral condyle. She was evaluated on 7/31 by Dr. Margaretha Sheffield, and she decided that she would like conservative treatment with Meloxicam and physical therapy. She has not participated in PT yet secondary to pulmonary infections which she claims are from mold in her home. The pain is persistent and severe, and she would like an injection today.    Review of Systems     Objective:   Physical Exam  BP 124/78  Ht 5\' 6"  (1.676 m)  Wt 201 lb (91.173 kg)  BMI 32.44 kg/m2  Physical Exam  Constitutional : NAD.  MSK: Left knee: Unchanged since previous visit. Large scar on the lateral and medial side of the knee well healed. Range of motion is -5-110. Trace effusion. She is tender to palpation along the course of the MCL, but good stability with valgus stressing. Slight tenderness to palpation along the lateral joint line. 1+ patellofemoral crepitus. She is neurovascular intact distally. Walking with a mild limp       Assessment & Plan:  51 y.o. F with left knee pain secondary to lateral meniscal injury and osteoarthritis who received an intra-articular injection.   Consent obtained and verified. Sterile betadine prep. Furthur cleansed with alcohol. Topical analgesic spray: Ethyl chloride. Joint: Left knee Approached in typical fashion with: antero-medial approach Completed without difficulty Meds: 40 mg depo-medrol, 3 ml marcaine Needle: 25 g, 1.5 in Aftercare instructions and Red flags  advised.   If symptoms persist consider referral to orthopedic surgery. Followup when necessary.

## 2011-09-16 NOTE — ED Notes (Signed)
Pt. called on VM and she was here yesterday and her medications were not sent to [facility] on [address].  I called pt. back @ 1504 and left a message that I needed her to spell her name and give me a DOB so I could find her chart.  1650 I called again and pt. said she was in the waiting room. I called her to my desk and told her I had tried to call her. She said she was at another appointment but got my message.  I asked Dr. Tressia Danas if I could call the Rx.'s in to her pharmacy. She said yes.  Rx.'s called to pharmacist @ [phone].  Pharmacist confirmed they did not get them yesterday and said it may be a problem with their equipment. Meghan Bowman 09/16/2011

## 2011-09-23 ENCOUNTER — Ambulatory Visit: Payer: Medicaid Other | Admitting: Sports Medicine

## 2011-10-01 ENCOUNTER — Encounter (HOSPITAL_COMMUNITY): Payer: Self-pay | Admitting: Emergency Medicine

## 2011-10-01 ENCOUNTER — Emergency Department (INDEPENDENT_AMBULATORY_CARE_PROVIDER_SITE_OTHER)
Admission: EM | Admit: 2011-10-01 | Discharge: 2011-10-01 | Disposition: A | Discharge status: Home / Self Care | Payer: Medicaid Other | Source: Home / Self Care | Attending: Emergency Medicine | Admitting: Emergency Medicine

## 2011-10-01 ENCOUNTER — Emergency Department (INDEPENDENT_AMBULATORY_CARE_PROVIDER_SITE_OTHER): Payer: Medicaid Other

## 2011-10-01 DIAGNOSIS — R0789 Other chest pain: Secondary | ICD-10-CM

## 2011-10-01 HISTORY — DX: Unspecified asthma, uncomplicated: J45.909

## 2011-10-01 LAB — POCT URINALYSIS DIP (DEVICE)
Glucose, UA: NEGATIVE mg/dL
Specific Gravity, Urine: 1.025 (ref 1.005–1.030)
Urobilinogen, UA: 0.2 mg/dL (ref 0.0–1.0)

## 2011-10-01 MED ORDER — TIZANIDINE HCL 4 MG PO TABS: 4.0000 mg | ORAL_TABLET | Freq: Two times a day (BID) | ORAL | Status: AC

## 2011-10-01 MED ORDER — OMEPRAZOLE 20 MG PO CPDR: DELAYED_RELEASE_CAPSULE | ORAL | Status: DC

## 2011-10-01 MED ORDER — DEXAMETHASONE 4 MG PO TABS: ORAL_TABLET | ORAL | Status: DC

## 2011-10-01 MED ORDER — ALBUTEROL SULFATE HFA 108 (90 BASE) MCG/ACT IN AERS: 2.0000 | INHALATION_SPRAY | Freq: Four times a day (QID) | RESPIRATORY_TRACT | Status: DC | PRN

## 2011-10-01 NOTE — ED Notes (Addendum)
Pt c/o back pain x3 days that radiates to the front, under her breast.... She denies, fever, vomiting, diarrhea, nausea, urinary problems.

## 2011-10-01 NOTE — ED Notes (Signed)
Call from pharmacy for dosage clarification on zanaflex. As Dr Chaney Malling is not present, spoke w Dr Bunnie Philips, who authorized take 1 tablet q 4-6 hours as needed for symptoms. Spoke directly with pharmacist
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
## 2011-10-06 ENCOUNTER — Encounter: Payer: Self-pay | Admitting: Pulmonary Disease

## 2011-10-06 ENCOUNTER — Ambulatory Visit (INDEPENDENT_AMBULATORY_CARE_PROVIDER_SITE_OTHER): Payer: Medicaid Other | Admitting: Pulmonary Disease

## 2011-10-06 VITALS — BP 122/88 | HR 85 | Temp 97.7°F | Ht 65.0 in | Wt 203.0 lb

## 2011-10-06 DIAGNOSIS — R0600 Dyspnea, unspecified: Secondary | ICD-10-CM | POA: Insufficient documentation

## 2011-10-06 DIAGNOSIS — J45909 Unspecified asthma, uncomplicated: Secondary | ICD-10-CM

## 2011-10-06 DIAGNOSIS — R0989 Other specified symptoms and signs involving the circulatory and respiratory systems: Secondary | ICD-10-CM

## 2011-10-06 DIAGNOSIS — Z720 Tobacco use: Secondary | ICD-10-CM

## 2011-10-06 DIAGNOSIS — F17200 Nicotine dependence, unspecified, uncomplicated: Secondary | ICD-10-CM

## 2011-10-06 MED ORDER — BUDESONIDE-FORMOTEROL FUMARATE 160-4.5 MCG/ACT IN AERO: 2.0000 | INHALATION_SPRAY | Freq: Two times a day (BID) | RESPIRATORY_TRACT | Status: DC

## 2011-10-06 NOTE — Patient Instructions (Signed)
Symbicort two puffs twice per day, and rinse mouth after each use Stop qvar while using symbicort Albuterol two puffs as needed for cough, wheeze, or chest congestion Follow up in 2 to 3 weeks
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
## 2011-10-06 NOTE — Assessment & Plan Note (Signed)
Discussed importance of smoking cessation. 

## 2011-10-06 NOTE — Assessment & Plan Note (Signed)
She reports symptoms onset after recent possible exposure to mold.  She is waiting to her from private environmental assessment company to determine what type of exposure she had.  She reports partial improvement in her symptoms with prednisone and inhaler therapy.  Her symptoms are suggestive of asthma.  However her spirometry and exhaled nitric oxide were normal, and recent chest xrays have been normal.  Will change her from qvar to symbicort 160/4.5 two puffs bid.  She can continue albuterol as needed.  Will follow up in two weeks.  If her symptoms are not improved, then she will need additional pulmonary testing.

## 2011-10-06 NOTE — Progress Notes (Deleted)
  Subjective:    Patient ID: Meghan Bowman, female    DOB: 1960/10/15, 51 y.o.   MRN: [id number]  HPI    Review of Systems  Constitutional: Positive for appetite change and unexpected weight change. Negative for fever.  HENT: Positive for trouble swallowing. Negative for ear pain, congestion, sore throat, sneezing and dental problem.   Respiratory: Positive for shortness of breath. Negative for cough.   Cardiovascular: Negative for chest pain, palpitations and leg swelling.  Gastrointestinal: Negative for abdominal pain.  Musculoskeletal: Negative for joint swelling.  Neurological: Positive for headaches.  Psychiatric/Behavioral: Negative for dysphoric mood. The patient is not nervous/anxious.        Objective:   Physical Exam        Assessment & Plan:

## 2011-10-18 ENCOUNTER — Encounter: Payer: Self-pay | Admitting: Pulmonary Disease

## 2011-10-29 ENCOUNTER — Ambulatory Visit (INDEPENDENT_AMBULATORY_CARE_PROVIDER_SITE_OTHER): Payer: Medicaid Other | Admitting: Pulmonary Disease

## 2011-10-29 ENCOUNTER — Encounter: Payer: Self-pay | Admitting: Pulmonary Disease

## 2011-10-29 VITALS — BP 122/82 | HR 71 | Temp 97.9°F | Ht 65.4 in | Wt 204.2 lb

## 2011-10-29 DIAGNOSIS — R0600 Dyspnea, unspecified: Secondary | ICD-10-CM

## 2011-10-29 DIAGNOSIS — R0989 Other specified symptoms and signs involving the circulatory and respiratory systems: Secondary | ICD-10-CM

## 2011-10-29 DIAGNOSIS — Z720 Tobacco use: Secondary | ICD-10-CM

## 2011-10-29 DIAGNOSIS — F17200 Nicotine dependence, unspecified, uncomplicated: Secondary | ICD-10-CM

## 2011-10-29 DIAGNOSIS — Z23 Encounter for immunization: Secondary | ICD-10-CM

## 2011-10-29 MED ORDER — CETIRIZINE HCL 10 MG PO TABS: 10.0000 mg | ORAL_TABLET | Freq: Every day | ORAL | Status: DC

## 2011-10-29 MED ORDER — OMEPRAZOLE 20 MG PO CPDR: 20.0000 mg | DELAYED_RELEASE_CAPSULE | Freq: Every day | ORAL | Status: DC

## 2011-10-29 MED ORDER — BUDESONIDE-FORMOTEROL FUMARATE 160-4.5 MCG/ACT IN AERO: 2.0000 | INHALATION_SPRAY | Freq: Two times a day (BID) | RESPIRATORY_TRACT | Status: DC

## 2011-10-29 NOTE — Patient Instructions (Signed)
Follow up in 3 months

## 2011-10-29 NOTE — Assessment & Plan Note (Signed)
Discussed importance of smoking cessation. 
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
## 2011-10-29 NOTE — Assessment & Plan Note (Signed)
She continues to smoke.  She reports exposure to mold as a contributor to her dyspnea.  She reports some clinical benefit from using symbicort.  Will continue this for now.  She will bring information from her home environmental analysis.  She is getting allergy shots through her PCP.  She can continue zyrtec.  Will defer to PCP whether she needs additional cardiac evaluation.  I explained that there are no pulmonary restrictions to her having knee surgery.

## 2011-11-02 ENCOUNTER — Ambulatory Visit (INDEPENDENT_AMBULATORY_CARE_PROVIDER_SITE_OTHER): Payer: Medicaid Other | Admitting: Sports Medicine

## 2011-11-02 ENCOUNTER — Encounter: Payer: Self-pay | Admitting: Sports Medicine

## 2011-11-02 VITALS — BP 129/92 | HR 65 | Ht 65.5 in | Wt 204.0 lb

## 2011-11-02 DIAGNOSIS — M1710 Unilateral primary osteoarthritis, unspecified knee: Secondary | ICD-10-CM

## 2011-11-02 DIAGNOSIS — M25569 Pain in unspecified knee: Secondary | ICD-10-CM

## 2011-11-02 MED ORDER — KETOROLAC TROMETHAMINE 60 MG/2ML IM SOLN
60.0000 mg | Freq: Once | INTRAMUSCULAR | Status: AC
  Administered 2011-11-02: 60 mg via INTRAMUSCULAR

## 2011-11-02 NOTE — Progress Notes (Signed)
  Subjective:    Patient ID: Meghan Bowman, female    DOB: [birth year], 51 y.o.   MRN: [id number]  HPI Patient comes in today with persistent left knee pain. Recent x-rays and MRI scans have shown advanced lateral compartmental DJD as well as a meniscal tear. Pain began acutely after a traumatic fall. She's failed conservative treatment to date including cortisone injections and oral anti-inflammatories. She continues with debilitating pain and intermittent swelling. Gets occasional catching and popping. She would like to discuss surgical options.  She has recently been treated by pulmonary for a fungal infection in her lungs, but she tells me that she has been given Medical clearance by her pulmonologist.    Review of Systems     Objective:   Physical Exam Well-developed, well-nourished. No acute distress. Awake alert oriented x3  Left knee still shows range of motion from -5- 110 degrees. Trace effusion. Tenderness to palpation along the lateral joint line with pain but no popping with McMurray's. Some tenderness along the medial joint line as well. Good ligamentous stability. Neurovascular intact distally. 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 Dion Saucier to discuss surgical options. She may initially benefit from an arthroscopy but definitive treatment will likely be in the form of a total knee arthroplasty at some point. I injected her today with 60 mg of Toradol IM. I did this in lieu of giving her pain medication given her history of substance abuse in the past. At this point I would defer further treatment to the discretion of Dr. Dion Saucier. Patient will followup with me when necessary.

## 2011-11-02 NOTE — Patient Instructions (Addendum)
You have been scheduled for an appointment with Dr. Dion Saucier at 2:45 pm 11/03/11.   They are located at [facility] [address] Phone number is [phone]

## 2011-11-03 ENCOUNTER — Ambulatory Visit: Payer: Medicaid Other | Admitting: Sports Medicine

## 2012-01-26 HISTORY — PX: KNEE ARTHROSCOPY: SHX127

## 2012-01-27 ENCOUNTER — Other Ambulatory Visit (HOSPITAL_COMMUNITY): Payer: Self-pay | Admitting: Internal Medicine

## 2012-01-27 DIAGNOSIS — Z1231 Encounter for screening mammogram for malignant neoplasm of breast: Secondary | ICD-10-CM

## 2012-02-17 ENCOUNTER — Ambulatory Visit: Payer: Medicaid Other | Admitting: Pulmonary Disease

## 2012-03-06 ENCOUNTER — Ambulatory Visit (HOSPITAL_COMMUNITY): Payer: Medicaid Other

## 2012-03-14 ENCOUNTER — Ambulatory Visit (HOSPITAL_COMMUNITY)
Admission: RE | Admit: 2012-03-14 | Discharge: 2012-03-14 | Disposition: A | Discharge status: Home / Self Care | Payer: Medicaid Other | Source: Ambulatory Visit | Attending: Internal Medicine | Admitting: Internal Medicine

## 2012-03-14 DIAGNOSIS — Z1231 Encounter for screening mammogram for malignant neoplasm of breast: Secondary | ICD-10-CM | POA: Insufficient documentation

## 2012-07-24 ENCOUNTER — Encounter (HOSPITAL_COMMUNITY): Payer: Self-pay | Admitting: Emergency Medicine

## 2012-07-24 ENCOUNTER — Observation Stay (HOSPITAL_COMMUNITY)
Admission: EM | Admit: 2012-07-24 | Discharge: 2012-07-26 | Disposition: A | Discharge status: Home / Self Care | Payer: Medicaid Other | Attending: Internal Medicine | Admitting: Internal Medicine

## 2012-07-24 ENCOUNTER — Emergency Department (HOSPITAL_COMMUNITY): Payer: Medicaid Other

## 2012-07-24 DIAGNOSIS — F1021 Alcohol dependence, in remission: Secondary | ICD-10-CM | POA: Insufficient documentation

## 2012-07-24 DIAGNOSIS — M25569 Pain in unspecified knee: Secondary | ICD-10-CM

## 2012-07-24 DIAGNOSIS — J45909 Unspecified asthma, uncomplicated: Secondary | ICD-10-CM | POA: Insufficient documentation

## 2012-07-24 DIAGNOSIS — F1920 Other psychoactive substance dependence, uncomplicated: Secondary | ICD-10-CM | POA: Diagnosis present

## 2012-07-24 DIAGNOSIS — R51 Headache: Secondary | ICD-10-CM | POA: Insufficient documentation

## 2012-07-24 DIAGNOSIS — F17200 Nicotine dependence, unspecified, uncomplicated: Secondary | ICD-10-CM | POA: Insufficient documentation

## 2012-07-24 DIAGNOSIS — G9340 Encephalopathy, unspecified: Principal | ICD-10-CM | POA: Insufficient documentation

## 2012-07-24 DIAGNOSIS — Z720 Tobacco use: Secondary | ICD-10-CM

## 2012-07-24 DIAGNOSIS — Z79899 Other long term (current) drug therapy: Secondary | ICD-10-CM | POA: Insufficient documentation

## 2012-07-24 DIAGNOSIS — R0600 Dyspnea, unspecified: Secondary | ICD-10-CM

## 2012-07-24 DIAGNOSIS — R030 Elevated blood-pressure reading, without diagnosis of hypertension: Secondary | ICD-10-CM

## 2012-07-24 DIAGNOSIS — F4310 Post-traumatic stress disorder, unspecified: Secondary | ICD-10-CM | POA: Insufficient documentation

## 2012-07-24 DIAGNOSIS — R4182 Altered mental status, unspecified: Secondary | ICD-10-CM | POA: Diagnosis present

## 2012-07-24 DIAGNOSIS — F411 Generalized anxiety disorder: Secondary | ICD-10-CM | POA: Insufficient documentation

## 2012-07-24 LAB — URINALYSIS, ROUTINE W REFLEX MICROSCOPIC
Glucose, UA: NEGATIVE mg/dL
Leukocytes, UA: NEGATIVE
Protein, ur: NEGATIVE mg/dL
Specific Gravity, Urine: 1.014 (ref 1.005–1.030)
Urobilinogen, UA: 0.2 mg/dL (ref 0.0–1.0)
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
## 2012-07-24 NOTE — ED Notes (Signed)
Pt presents to the Ed with a complaint of altered mental status.  Pt went to the doctor today for migraine.  Doctor prescribed a medication which the pt claims to be non- narcotic.  Pt states she went to the drug store and suddenly realized she was in the middle of the store.  Pt has elevated blood pressure.  Pt is able to verbalize events.  Pt speech is coherent.  Pt is alert and orientated x 4.  Pt appears in no apparent distress.  Pt skin is pwd.

## 2012-07-25 ENCOUNTER — Other Ambulatory Visit: Payer: Self-pay

## 2012-07-25 ENCOUNTER — Observation Stay (HOSPITAL_COMMUNITY): Payer: Medicaid Other

## 2012-07-25 DIAGNOSIS — F4310 Post-traumatic stress disorder, unspecified: Secondary | ICD-10-CM

## 2012-07-25 DIAGNOSIS — R51 Headache: Secondary | ICD-10-CM

## 2012-07-25 DIAGNOSIS — F1021 Alcohol dependence, in remission: Secondary | ICD-10-CM

## 2012-07-25 DIAGNOSIS — F411 Generalized anxiety disorder: Secondary | ICD-10-CM

## 2012-07-25 DIAGNOSIS — F1920 Other psychoactive substance dependence, uncomplicated: Secondary | ICD-10-CM | POA: Diagnosis present

## 2012-07-25 DIAGNOSIS — R4182 Altered mental status, unspecified: Secondary | ICD-10-CM

## 2012-07-25 DIAGNOSIS — R030 Elevated blood-pressure reading, without diagnosis of hypertension: Secondary | ICD-10-CM

## 2012-07-25 LAB — CBC
MCH: 29.6 pg (ref 26.0–34.0)
MCHC: 33.8 g/dL (ref 30.0–36.0)
Platelets: 237 10*3/uL (ref 150–400)
RDW: 12.8 % (ref 11.5–15.5)

## 2012-07-25 LAB — COMPREHENSIVE METABOLIC PANEL
ALT: 10 U/L (ref 0–35)
AST: 18 U/L (ref 0–37)
Albumin: 3.9 g/dL (ref 3.5–5.2)
Alkaline Phosphatase: 81 U/L (ref 39–117)
BUN: 7 mg/dL (ref 6–23)
Chloride: 102 mEq/L (ref 96–112)
Potassium: 3.2 mEq/L — ABNORMAL LOW (ref 3.5–5.1)
Sodium: 139 mEq/L (ref 135–145)
Total Bilirubin: 0.2 mg/dL — ABNORMAL LOW (ref 0.3–1.2)
Total Protein: 7.6 g/dL (ref 6.0–8.3)

## 2012-07-25 LAB — CBC WITH DIFFERENTIAL/PLATELET
Basophils Relative: 0 % (ref 0–1)
Hemoglobin: 13.2 g/dL (ref 12.0–15.0)
MCHC: 33.8 g/dL (ref 30.0–36.0)
Monocytes Relative: 8 % (ref 3–12)
Neutro Abs: 4.5 10*3/uL (ref 1.7–7.7)
Neutrophils Relative %: 46 % (ref 43–77)
Platelets: 265 10*3/uL (ref 150–400)
RBC: 4.48 MIL/uL (ref 3.87–5.11)

## 2012-07-25 LAB — ETHANOL: Alcohol, Ethyl (B): <11 mg/dL (ref 0–11)

## 2012-07-25 LAB — BASIC METABOLIC PANEL
BUN: 7 mg/dL (ref 6–23)
Calcium: 9.1 mg/dL (ref 8.4–10.5)
GFR calc non Af Amer: >90 mL/min (ref >90)
Glucose, Bld: 95 mg/dL (ref 70–99)

## 2012-07-25 LAB — RAPID URINE DRUG SCREEN, HOSP PERFORMED
Amphetamines: NOT DETECTED
Barbiturates: POSITIVE — AB
Tetrahydrocannabinol: NOT DETECTED

## 2012-07-25 MED ORDER — SODIUM CHLORIDE 0.9 % IJ SOLN
3.0000 mL | Freq: Two times a day (BID) | INTRAMUSCULAR | Status: DC
  Administered 2012-07-26 (×2): 3 mL via INTRAVENOUS

## 2012-07-25 MED ORDER — BUDESONIDE-FORMOTEROL FUMARATE 160-4.5 MCG/ACT IN AERO
2.0000 | INHALATION_SPRAY | Freq: Two times a day (BID) | RESPIRATORY_TRACT | Status: DC | PRN
  Filled 2012-07-25: qty 6

## 2012-07-25 MED ORDER — SODIUM CHLORIDE 0.9 % IJ SOLN: 3.0000 mL | Freq: Two times a day (BID) | INTRAMUSCULAR | Status: DC

## 2012-07-25 MED ORDER — SERTRALINE HCL 100 MG PO TABS
200.0000 mg | ORAL_TABLET | Freq: Every evening | ORAL | Status: DC
Start: 2012-07-25 — End: 2012-07-26
  Administered 2012-07-25: 200 mg via ORAL
  Filled 2012-07-25 (×2): qty 2

## 2012-07-25 MED ORDER — KETOROLAC TROMETHAMINE 30 MG/ML IJ SOLN
30.0000 mg | Freq: Once | INTRAMUSCULAR | Status: AC
  Administered 2012-07-25: 30 mg via INTRAMUSCULAR
  Filled 2012-07-25: qty 1

## 2012-07-25 MED ORDER — ONDANSETRON HCL 4 MG PO TABS: 4.0000 mg | ORAL_TABLET | Freq: Four times a day (QID) | ORAL | Status: DC | PRN

## 2012-07-25 MED ORDER — KETOROLAC TROMETHAMINE 30 MG/ML IJ SOLN
30.0000 mg | Freq: Four times a day (QID) | INTRAMUSCULAR | Status: DC | PRN
  Administered 2012-07-25 – 2012-07-26 (×2): 30 mg via INTRAVENOUS
  Filled 2012-07-25 (×2): qty 1

## 2012-07-25 MED ORDER — SODIUM CHLORIDE 0.9 % IJ SOLN: 3.0000 mL | INTRAMUSCULAR | Status: DC | PRN

## 2012-07-25 MED ORDER — PNEUMOCOCCAL VAC POLYVALENT 25 MCG/0.5ML IJ INJ
0.5000 mL | INJECTION | INTRAMUSCULAR | Status: AC
  Administered 2012-07-26: 0.5 mL via INTRAMUSCULAR
  Filled 2012-07-25 (×2): qty 0.5

## 2012-07-25 MED ORDER — LORAZEPAM 2 MG/ML IJ SOLN
1.0000 mg | Freq: Once | INTRAMUSCULAR | Status: AC
  Administered 2012-07-25: 1 mg via INTRAVENOUS
  Filled 2012-07-25: qty 1

## 2012-07-25 MED ORDER — SODIUM CHLORIDE 0.9 % IV SOLN: 250.0000 mL | INTRAVENOUS | Status: DC | PRN

## 2012-07-25 MED ORDER — POTASSIUM CHLORIDE CRYS ER 20 MEQ PO TBCR
40.0000 meq | EXTENDED_RELEASE_TABLET | Freq: Once | ORAL | Status: AC
  Administered 2012-07-25: 40 meq via ORAL
  Filled 2012-07-25: qty 2

## 2012-07-25 MED ORDER — OXYCODONE-ACETAMINOPHEN 5-325 MG PO TABS
1.0000 | ORAL_TABLET | ORAL | Status: DC | PRN
  Administered 2012-07-25 – 2012-07-26 (×6): 1 via ORAL
  Filled 2012-07-25 (×6): qty 1

## 2012-07-25 MED ORDER — ONDANSETRON HCL 4 MG/2ML IJ SOLN: 4.0000 mg | Freq: Four times a day (QID) | INTRAMUSCULAR | Status: DC | PRN

## 2012-07-25 MED ORDER — DIPHENHYDRAMINE HCL 50 MG/ML IJ SOLN
12.5000 mg | Freq: Once | INTRAMUSCULAR | Status: AC
  Administered 2012-07-25: 12.5 mg via INTRAVENOUS
  Filled 2012-07-25: qty 1

## 2012-07-25 MED ORDER — GADOBENATE DIMEGLUMINE 529 MG/ML IV SOLN
20.0000 mL | Freq: Once | INTRAVENOUS | Status: AC
  Administered 2012-07-25: 20 mL via INTRAVENOUS

## 2012-07-25 MED ORDER — METOCLOPRAMIDE HCL 5 MG/ML IJ SOLN
10.0000 mg | Freq: Once | INTRAMUSCULAR | Status: AC
  Administered 2012-07-25: 10 mg via INTRAVENOUS
  Filled 2012-07-25: qty 2

## 2012-07-25 NOTE — Progress Notes (Addendum)
Please see earlier admission note by Dr. Onalee Hua. Patient seen and examined at bedside.  Admitted with altered mental status which was thought to be secondary to overdose of BuSpar.She is more alert this morning, follows commands appropriately and is hemodynamically stable. Potassium was slightly low on admission 3.2. Will supplement and repeat BMP today. I have placed consult for psychiatric evaluation. Patient denies suicidal or homicidal ideations this morning. CT head unremarkable, MRI of the brain ordered in ED to rule out stroke. This is still pending.  Debbora Presto, MD  [facility] Pager [phone]  If 7PM-7AM, please contact night-coverage [email or web address] Password [email or web address]

## 2012-07-25 NOTE — ED Notes (Signed)
Meghan Bowman to return call for report from 4th floor
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
## 2012-07-25 NOTE — ED Notes (Signed)
Pt states that she cant read any thing except at 20/200 range and nothing helps pt states its because she has a headache.,

## 2012-07-25 NOTE — ED Notes (Signed)
Per Dondra Spry NP  Change IM tramadol to IV tramadol

## 2012-07-25 NOTE — ED Notes (Signed)
Pt started taking taking butalbital 50mg ,  Pt stated she took 200mg  today and that she is in sobriety for 16 months

## 2012-07-26 LAB — BASIC METABOLIC PANEL
Chloride: 105 mEq/L (ref 96–112)
Creatinine, Ser: 0.65 mg/dL (ref 0.50–1.10)
GFR calc Af Amer: >90 mL/min (ref >90)
Potassium: 4 mEq/L (ref 3.5–5.1)

## 2012-07-26 LAB — CBC
Platelets: 222 10*3/uL (ref 150–400)
RDW: 12.8 % (ref 11.5–15.5)
WBC: 6.9 10*3/uL (ref 4.0–10.5)

## 2012-07-26 MED ORDER — ASPIRIN 325 MG PO TABS
325.0000 mg | ORAL_TABLET | Freq: Every day | ORAL | Status: DC
  Filled 2012-07-26: qty 1

## 2012-07-26 NOTE — Discharge Summary (Signed)
Physician Discharge Summary  Meghan Bowman ZOX:[id number] DOB: [birth year] DOA: 07/24/2012  PCP: Dorrene German, MD  Admit date: 07/24/2012 Discharge date: 07/26/2012  Time spent: Greater than 30 minutes  Recommendations for Outpatient Follow-up:  -Has been advised to follow up with her PCP in 2 weeks.  Discharge Diagnoses:  Principal Problem:   Altered mental state Active Problems:   PTSD   Chronic headaches   Substance addiction recovering   Discharge Condition: Stable and improved.  Filed Weights   07/24/12 2232 07/25/12 0550  Weight: 95.346 kg (210 lb 3.2 oz) 97.3 kg (214 lb 8.1 oz)    History of present illness:  Patient is a 52 yo female with h/o recovering drug addiction, asthma, ptsd went to see her doctor recently for chronic headaches she has been having for over several weeks. No n/v. No fevers. No vision changes. He started her on on buspar, she took 2 this morning then 2 again at 5p went to the store and became confused. Her sister was concerned so brought her to the ED. No focal neuro deficits except confusion and mild slurred speech which has resolved. No illnesses except the headaches. We were asked to admit her for further evaluation and management.   Hospital Course:   Acute Encephalopathy -Resolved; back to baseline. -Suspect related to medications. -CT/MRi Brain have been negative.  Chronic Headaches -Advised to follow up with her PCP for these issues. -Is not requesting narcotics.  Asthma -Stable. -Continue home meds.  Procedures:  None   Consultations:  None  Discharge Instructions  Discharge Orders   Future Appointments Provider Department Dept Phone   08/16/2012 8:30 PM [facility] [phone]   Future Orders Complete By Expires     Diet - low sodium heart healthy  As directed     Discontinue IV  As directed     Increase activity slowly  As directed         Medication List     STOP taking these medications       butalbital-aspirin-caffeine 50-325-40 MG per tablet  Commonly known as:  FIORINAL     naproxen sodium 220 MG tablet  Commonly known as:  ANAPROX      TAKE these medications       albuterol 108 (90 BASE) MCG/ACT inhaler  Commonly known as:  PROVENTIL HFA;VENTOLIN HFA  Inhale 2 puffs into the lungs every 6 (six) hours as needed for wheezing or shortness of breath.     budesonide-formoterol 160-4.5 MCG/ACT inhaler  Commonly known as:  SYMBICORT  Inhale 2 puffs into the lungs 2 (two) times daily as needed (for shortness of breath).     cetirizine 10 MG tablet  Commonly known as:  ZYRTEC  Take 1 tablet (10 mg total) by mouth daily.     omeprazole 20 MG capsule  Commonly known as:  PRILOSEC  Take 20 mg by mouth daily as needed (for heartburn).     sertraline 100 MG tablet  Commonly known as:  ZOLOFT  Take 200 mg by mouth every evening. Take at 7:30pm       No Known Allergies     Follow-up Information   Follow up with AVBUERE,EDWIN A, MD. Schedule an appointment as soon as possible for a visit in 2 weeks.   Contact information:   [address] [place] [address] [phone]        The results of significant diagnostics from this hospitalization (  including imaging, microbiology, ancillary and laboratory) are listed below for reference.    Significant Diagnostic Studies: Ct Head Wo Contrast  07/25/2012   *RADIOLOGY REPORT*  Clinical Data: Altered mental status.  Confusion.  CT HEAD WITHOUT CONTRAST  Technique:  Contiguous axial images were obtained from the base of the skull through the vertex without contrast.  Comparison: None.  Findings: No mass lesion, mass effect, midline shift, hydrocephalus, hemorrhage.  No territorial ischemia or acute infarction.  Calvarium intact.  Paranasal sinuses appear within normal limits.  IMPRESSION: Negative CT head.   Original Report Authenticated By: Andreas Newport, M.D.   Mr Laqueta Jean Wo  Contrast  07/25/2012   *RADIOLOGY REPORT*  Clinical Data: Altered mental status.  Headaches.  MRI HEAD WITHOUT AND WITH CONTRAST  Technique:  Multiplanar, multiecho pulse sequences of the brain and surrounding structures were obtained according to standard protocol without and with intravenous contrast  Contrast: 20mL MULTIHANCE GADOBENATE DIMEGLUMINE 529 MG/ML IV SOLN  Comparison: CT head without contrast 07/24/2012  Findings: Study is mildly degraded by patient motion.  The diffusion weighted images demonstrate no evidence for acute or subacute infarction.  There is no significant white matter disease. The ventricles are normal size.  No significant extra-axial fluid collections are present.  Flow is present in the major intracranial arteries.  The globes and orbits are intact.  The paranasal sinuses and mastoid air cells are clear.  The postcontrast images demonstrate no pathologic enhancement.  IMPRESSION: Negative MRI of the brain.   Original Report Authenticated By: Marin Roberts, M.D.    Microbiology: No results found for this or any previous visit (from the past 240 hour(s)).   Labs: Basic Metabolic Panel:  Recent Labs Lab 07/25/12 0028 07/25/12 0534 07/26/12 0355  NA 139 143 139  K 3.2* 2.9* 4.0  CL 102 107 105  CO2 25 25 28   GLUCOSE 115* 95 98  BUN 7 7 9   CREATININE 0.64 0.70 0.65  CALCIUM 9.4 9.1 9.2   Liver Function Tests:  Recent Labs Lab 07/25/12 0028  AST 18  ALT 10  ALKPHOS 81  BILITOT 0.2*  PROT 7.6  ALBUMIN 3.9   No results found for this basename: LIPASE, AMYLASE,  in the last 168 hours No results found for this basename: AMMONIA,  in the last 168 hours CBC:  Recent Labs Lab 07/25/12 0028 07/25/12 0630 07/26/12 0355  WBC 9.9 8.1 6.9  NEUTROABS 4.5  --   --   HGB 13.2 11.8* 11.5*  HCT 39.0 34.9* 34.9*  MCV 87.1 87.7 88.1  PLT 265 237 222   Cardiac Enzymes: No results found for this basename: CKTOTAL, CKMB, CKMBINDEX, TROPONINI,  in the last  168 hours BNP: BNP (last 3 results) No results found for this basename: PROBNP,  in the last 8760 hours CBG: No results found for this basename: GLUCAP,  in the last 168 hours     Signed:  Chaya Jan  [facility] Pager: [phone] 07/26/2012, 1:36 PM

## 2012-07-26 NOTE — Progress Notes (Signed)
CSW spoke with patient re: psych follow-up at discharge. Patient states that she attends AA meetings - declined information for [facility] as she states that she already sees a psychiatrist in the community and plans to follow-up with him. CSW made Dr. Ardyth Harps aware. CSW signing off.   Unice Bailey, LCSW [facility] Clinical Social Worker cell #: [phone]

## 2012-07-27 NOTE — ED Provider Notes (Signed)
Medical screening examination/treatment/procedure(s) were performed by non-physician practitioner and as supervising physician I was immediately available for consultation/collaboration.  Ilias Stcharles, MD 07/27/12 0041 

## 2012-08-16 ENCOUNTER — Ambulatory Visit (HOSPITAL_BASED_OUTPATIENT_CLINIC_OR_DEPARTMENT_OTHER): Payer: Medicaid Other | Attending: Internal Medicine

## 2012-08-16 DIAGNOSIS — G4730 Sleep apnea, unspecified: Secondary | ICD-10-CM | POA: Insufficient documentation

## 2012-08-16 DIAGNOSIS — G4733 Obstructive sleep apnea (adult) (pediatric): Secondary | ICD-10-CM

## 2012-08-16 DIAGNOSIS — G4710 Hypersomnia, unspecified: Secondary | ICD-10-CM | POA: Insufficient documentation

## 2012-08-16 DIAGNOSIS — G4761 Periodic limb movement disorder: Secondary | ICD-10-CM | POA: Insufficient documentation

## 2012-08-19 DIAGNOSIS — G4761 Periodic limb movement disorder: Secondary | ICD-10-CM

## 2012-08-19 DIAGNOSIS — R0989 Other specified symptoms and signs involving the circulatory and respiratory systems: Secondary | ICD-10-CM

## 2012-08-19 DIAGNOSIS — R0609 Other forms of dyspnea: Secondary | ICD-10-CM

## 2012-08-19 NOTE — Procedures (Signed)
NAME:  Meghan Bowman, Meghan Bowman                ACCOUNT NO.:  [id number]  MEDICAL RECORD NO.:  [id number]          PATIENT TYPE:  OUT  LOCATION:  SLEEP CENTER                 FACILITY:  [facility]  PHYSICIAN:  Fionna Merriott D. Maple Hudson, MD, FCCP, FACPDATE OF BIRTH:  1959/08/26  DATE OF STUDY:  08/16/2012                           NOCTURNAL POLYSOMNOGRAM  REFERRING PHYSICIAN:  Fleet Contras, M.D.  REFERRING PHYSICIAN:  Fleet Contras, MD  INDICATION FOR STUDY:  Hypersomnia with sleep apnea.  EPWORTH SLEEPINESS SCORE:  6/24.  BMI 35, weight 209 pounds, height 65 inches, neck 15 inches.  MEDICATIONS:  Home medications charted for review.  SLEEP ARCHITECTURE:  Total sleep time 293.5 minutes with sleep efficiency 78.2%.  Stage I was 8.5%, stage II 79.7%, stage III 0.5%, REM 11.2% of total sleep time.  Sleep latency 59 minutes, REM latency 201 minutes.  Awake after sleep onset 23 minutes.  Arousal index 9.8. Bedtime medication:  None.  RESPIRATORY DATA:  Apnea-hypopnea index (AHI) 1.6 per hour.  A total of 8 events was scored including 3 central apneas and 5 hypopneas.  All were associated with nonsupine sleep position.  REM AHI 0.  There were not enough events to permit CPAP titration.  OXYGEN DATA:  Moderate snoring with oxygen desaturation to a nadir of 90% and mean oxygen saturation through the study of 93.9% on room air.  CARDIAC DATA:  Normal sinus rhythm.  MOVEMENT/PARASOMNIA:  Frequent limb jerks.  A total of 336 limb jerks were counted of which 20 were associated with arousal or awakening for periodic limb movement with arousal index of 4.1 per hour. Bathroom x1.  IMPRESSION/RECOMMENDATION: 1. Occasional respiratory events with sleep disturbance, within normal     limits.  AHI 1.6 per hour (the normal range for adults is an AHA     from 0-5 events per hour).  Moderate snoring with oxygen     desaturation to a nadir of 90% and mean oxygen saturation through     the study of 93.9% on room air. 2.  Periodic limb movement with arousal syndrome.  A total of 336 limb     jerks were counted, of which 20 were associated with arousal or     awakening for periodic limb movement with arousal index of 4.1 per     hour.  This     may be enough to cause nonrestorative sleep complaints.  Consider a     trial of specific therapy such as ReQuip or Mirapex if clinically     appropriate.     Jan Walters D. Maple Hudson, MD, [facility], FACP Diplomate, American Board of Sleep Medicine    CDY/MEDQ  D:  08/19/2012 14:17:17  T:  08/19/2012 15:56:10  Job:  [id number]

## 2012-10-02 ENCOUNTER — Emergency Department (HOSPITAL_COMMUNITY): Payer: Medicaid Other

## 2012-10-02 ENCOUNTER — Encounter (HOSPITAL_COMMUNITY): Payer: Self-pay | Admitting: Emergency Medicine

## 2012-10-02 ENCOUNTER — Observation Stay (HOSPITAL_COMMUNITY)
Admission: EM | Admit: 2012-10-02 | Discharge: 2012-10-03 | Disposition: A | Discharge status: Home / Self Care | Payer: Medicaid Other | Attending: Internal Medicine | Admitting: Internal Medicine

## 2012-10-02 ENCOUNTER — Other Ambulatory Visit: Payer: Self-pay

## 2012-10-02 DIAGNOSIS — F4310 Post-traumatic stress disorder, unspecified: Secondary | ICD-10-CM

## 2012-10-02 DIAGNOSIS — R4182 Altered mental status, unspecified: Secondary | ICD-10-CM

## 2012-10-02 DIAGNOSIS — Z79899 Other long term (current) drug therapy: Secondary | ICD-10-CM | POA: Insufficient documentation

## 2012-10-02 DIAGNOSIS — E785 Hyperlipidemia, unspecified: Secondary | ICD-10-CM | POA: Insufficient documentation

## 2012-10-02 DIAGNOSIS — R0600 Dyspnea, unspecified: Secondary | ICD-10-CM

## 2012-10-02 DIAGNOSIS — M79609 Pain in unspecified limb: Secondary | ICD-10-CM | POA: Insufficient documentation

## 2012-10-02 DIAGNOSIS — R079 Chest pain, unspecified: Principal | ICD-10-CM | POA: Diagnosis present

## 2012-10-02 DIAGNOSIS — Z720 Tobacco use: Secondary | ICD-10-CM

## 2012-10-02 DIAGNOSIS — E876 Hypokalemia: Secondary | ICD-10-CM | POA: Diagnosis present

## 2012-10-02 DIAGNOSIS — I10 Essential (primary) hypertension: Secondary | ICD-10-CM | POA: Diagnosis present

## 2012-10-02 DIAGNOSIS — Z87891 Personal history of nicotine dependence: Secondary | ICD-10-CM | POA: Insufficient documentation

## 2012-10-02 DIAGNOSIS — R209 Unspecified disturbances of skin sensation: Secondary | ICD-10-CM | POA: Insufficient documentation

## 2012-10-02 DIAGNOSIS — R51 Headache: Secondary | ICD-10-CM

## 2012-10-02 DIAGNOSIS — F1920 Other psychoactive substance dependence, uncomplicated: Secondary | ICD-10-CM

## 2012-10-02 DIAGNOSIS — R1110 Vomiting, unspecified: Secondary | ICD-10-CM | POA: Insufficient documentation

## 2012-10-02 LAB — CBC WITH DIFFERENTIAL/PLATELET
Basophils Absolute: 0 10*3/uL (ref 0.0–0.1)
Basophils Relative: 0 % (ref 0–1)
Hemoglobin: 12.4 g/dL (ref 12.0–15.0)
MCHC: 34.5 g/dL (ref 30.0–36.0)
Monocytes Relative: 7 % (ref 3–12)
Neutro Abs: 5 10*3/uL (ref 1.7–7.7)
Neutrophils Relative %: 55 % (ref 43–77)

## 2012-10-02 LAB — COMPREHENSIVE METABOLIC PANEL
ALT: 10 U/L (ref 0–35)
AST: 15 U/L (ref 0–37)
Albumin: 3.9 g/dL (ref 3.5–5.2)
Alkaline Phosphatase: 74 U/L (ref 39–117)
BUN: 8 mg/dL (ref 6–23)
Chloride: 104 mEq/L (ref 96–112)
Potassium: 3.2 mEq/L — ABNORMAL LOW (ref 3.5–5.1)
Sodium: 137 mEq/L (ref 135–145)
Total Bilirubin: 0.2 mg/dL — ABNORMAL LOW (ref 0.3–1.2)

## 2012-10-02 LAB — RAPID URINE DRUG SCREEN, HOSP PERFORMED
Amphetamines: NOT DETECTED
Barbiturates: NOT DETECTED
Benzodiazepines: NOT DETECTED

## 2012-10-02 LAB — TROPONIN I
Troponin I: <0.3 ng/mL (ref <0.30)
Troponin I: <0.3 ng/mL (ref <0.30)
Troponin I: <0.3 ng/mL (ref <0.30)

## 2012-10-02 MED ORDER — OXYCODONE-ACETAMINOPHEN 5-325 MG PO TABS
1.0000 | ORAL_TABLET | ORAL | Status: DC | PRN
  Administered 2012-10-02 – 2012-10-03 (×4): 2 via ORAL
  Filled 2012-10-02 (×4): qty 2

## 2012-10-02 MED ORDER — ASPIRIN 81 MG PO CHEW
324.0000 mg | CHEWABLE_TABLET | Freq: Once | ORAL | Status: AC
  Administered 2012-10-02: 324 mg via ORAL
  Filled 2012-10-02: qty 4

## 2012-10-02 MED ORDER — FLUTICASONE PROPIONATE 50 MCG/ACT NA SUSP
2.0000 | Freq: Every day | NASAL | Status: DC
  Filled 2012-10-02: qty 16

## 2012-10-02 MED ORDER — IBUPROFEN 800 MG PO TABS: 800.0000 mg | ORAL_TABLET | Freq: Three times a day (TID) | ORAL | Status: DC | PRN

## 2012-10-02 MED ORDER — POTASSIUM CHLORIDE CRYS ER 20 MEQ PO TBCR
40.0000 meq | EXTENDED_RELEASE_TABLET | Freq: Once | ORAL | Status: AC
  Administered 2012-10-02: 40 meq via ORAL
  Filled 2012-10-02: qty 2

## 2012-10-02 MED ORDER — NITROGLYCERIN 0.4 MG SL SUBL: 0.4000 mg | SUBLINGUAL_TABLET | SUBLINGUAL | Status: DC | PRN

## 2012-10-02 MED ORDER — LORATADINE 10 MG PO TABS
10.0000 mg | ORAL_TABLET | Freq: Every day | ORAL | Status: DC
  Administered 2012-10-03: 10 mg via ORAL
  Filled 2012-10-02: qty 1

## 2012-10-02 MED ORDER — ONDANSETRON HCL 4 MG/2ML IJ SOLN: 4.0000 mg | Freq: Four times a day (QID) | INTRAMUSCULAR | Status: DC | PRN

## 2012-10-02 MED ORDER — INFLUENZA VAC SPLIT QUAD 0.5 ML IM SUSP
0.5000 mL | INTRAMUSCULAR | Status: AC
  Administered 2012-10-03: 0.5 mL via INTRAMUSCULAR
  Filled 2012-10-02 (×2): qty 0.5

## 2012-10-02 MED ORDER — HYDRALAZINE HCL 20 MG/ML IJ SOLN: 10.0000 mg | Freq: Four times a day (QID) | INTRAMUSCULAR | Status: DC | PRN

## 2012-10-02 MED ORDER — BUDESONIDE-FORMOTEROL FUMARATE 80-4.5 MCG/ACT IN AERO
2.0000 | INHALATION_SPRAY | Freq: Two times a day (BID) | RESPIRATORY_TRACT | Status: DC
  Administered 2012-10-02 – 2012-10-03 (×3): 2 via RESPIRATORY_TRACT
  Filled 2012-10-02: qty 6.9

## 2012-10-02 MED ORDER — NITROGLYCERIN 0.4 MG SL SUBL
0.4000 mg | SUBLINGUAL_TABLET | SUBLINGUAL | Status: DC | PRN
  Administered 2012-10-02: 0.4 mg via SUBLINGUAL
  Filled 2012-10-02: qty 25

## 2012-10-02 MED ORDER — LISINOPRIL 20 MG PO TABS
20.0000 mg | ORAL_TABLET | Freq: Every day | ORAL | Status: DC
  Administered 2012-10-02 – 2012-10-03 (×2): 20 mg via ORAL
  Filled 2012-10-02 (×2): qty 1

## 2012-10-02 MED ORDER — SERTRALINE HCL 100 MG PO TABS
200.0000 mg | ORAL_TABLET | Freq: Every evening | ORAL | Status: DC
  Administered 2012-10-02 – 2012-10-03 (×2): 200 mg via ORAL
  Filled 2012-10-02 (×2): qty 2

## 2012-10-02 MED ORDER — HYDROMORPHONE HCL PF 1 MG/ML IJ SOLN
1.0000 mg | INTRAMUSCULAR | Status: DC | PRN
  Administered 2012-10-02 – 2012-10-03 (×9): 1 mg via INTRAVENOUS
  Filled 2012-10-02 (×10): qty 1

## 2012-10-02 MED ORDER — INFLUENZA VIRUS VACC SPLIT PF IM SUSP: 0.5000 mL | INTRAMUSCULAR | Status: DC

## 2012-10-02 MED ORDER — ENOXAPARIN SODIUM 40 MG/0.4ML ~~LOC~~ SOLN
40.0000 mg | SUBCUTANEOUS | Status: DC
  Administered 2012-10-02 – 2012-10-03 (×2): 40 mg via SUBCUTANEOUS
  Filled 2012-10-02 (×2): qty 0.4

## 2012-10-02 MED ORDER — ACETAMINOPHEN 325 MG PO TABS
650.0000 mg | ORAL_TABLET | Freq: Once | ORAL | Status: AC
  Administered 2012-10-02: 650 mg via ORAL
  Filled 2012-10-02: qty 2

## 2012-10-02 MED ORDER — FLUTICASONE PROPIONATE HFA 44 MCG/ACT IN AERO: 1.0000 | INHALATION_SPRAY | Freq: Two times a day (BID) | RESPIRATORY_TRACT | Status: DC

## 2012-10-02 MED ORDER — ASPIRIN 81 MG PO CHEW
81.0000 mg | CHEWABLE_TABLET | Freq: Once | ORAL | Status: AC
  Administered 2012-10-02: 81 mg via ORAL
  Filled 2012-10-02: qty 1

## 2012-10-02 MED ORDER — ACETAMINOPHEN 325 MG PO TABS: 650.0000 mg | ORAL_TABLET | ORAL | Status: DC | PRN
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
## 2012-10-02 NOTE — ED Notes (Signed)
Pt c/o centralized chest pain that radiates to left arm, neck and back.  Pt also c/o numbness and tingling in l arm since sat.  Pt has cardiac hx and family hx of MIs.
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
## 2012-10-03 DIAGNOSIS — I10 Essential (primary) hypertension: Secondary | ICD-10-CM

## 2012-10-03 DIAGNOSIS — F1920 Other psychoactive substance dependence, uncomplicated: Secondary | ICD-10-CM

## 2012-10-03 DIAGNOSIS — R072 Precordial pain: Secondary | ICD-10-CM

## 2012-10-03 LAB — LIPID PANEL
Cholesterol: 205 mg/dL — ABNORMAL HIGH (ref 0–200)
Triglycerides: 238 mg/dL — ABNORMAL HIGH (ref <150)
VLDL: 48 mg/dL — ABNORMAL HIGH (ref 0–40)

## 2012-10-03 LAB — CBC
Hemoglobin: 11.8 g/dL — ABNORMAL LOW (ref 12.0–15.0)
MCH: 29.7 pg (ref 26.0–34.0)
MCHC: 33 g/dL (ref 30.0–36.0)
Platelets: 259 10*3/uL (ref 150–400)
RDW: 13.4 % (ref 11.5–15.5)

## 2012-10-03 LAB — TROPONIN I: Troponin I: <0.3 ng/mL (ref <0.30)

## 2012-10-03 LAB — BASIC METABOLIC PANEL
BUN: 11 mg/dL (ref 6–23)
Calcium: 9.1 mg/dL (ref 8.4–10.5)
GFR calc Af Amer: >90 mL/min (ref >90)
GFR calc non Af Amer: >90 mL/min (ref >90)
Glucose, Bld: 108 mg/dL — ABNORMAL HIGH (ref 70–99)
Potassium: 3.8 mEq/L (ref 3.5–5.1)

## 2012-10-03 MED ORDER — LISINOPRIL 10 MG PO TABS: 10.0000 mg | ORAL_TABLET | Freq: Every day | ORAL | Status: DC

## 2012-10-03 MED ORDER — DIPHENHYDRAMINE HCL 25 MG PO CAPS: 25.0000 mg | ORAL_CAPSULE | Freq: Four times a day (QID) | ORAL | Status: DC | PRN

## 2012-10-03 MED ORDER — DIPHENHYDRAMINE HCL 50 MG/ML IJ SOLN
12.5000 mg | Freq: Four times a day (QID) | INTRAMUSCULAR | Status: DC | PRN
  Administered 2012-10-03 (×2): 12.5 mg via INTRAVENOUS
  Filled 2012-10-03 (×2): qty 1

## 2012-10-03 MED ORDER — ASPIRIN EC 325 MG PO TBEC
325.0000 mg | DELAYED_RELEASE_TABLET | Freq: Every day | ORAL | Status: DC
  Administered 2012-10-03: 10:00:00 325 mg via ORAL
  Filled 2012-10-03: qty 1

## 2012-10-03 NOTE — Progress Notes (Signed)
Pt left facility at 2000 with her daughter. Pt alert and oriented and without c/o. Discharge instructions/prescriptions given/explained with pt verbalizing understanding.  Pt vomited X one prior to discharge, and stated it was the Percocet she was just given because she drank it with Coke. Pt stated she was "fine" and "OK to go".

## 2012-10-03 NOTE — Progress Notes (Signed)
Echocardiogram 2D Echocardiogram has been performed.  Salahuddin Arismendez 10/03/2012, 2:33 PM

## 2012-10-03 NOTE — Discharge Summary (Signed)
Physician Discharge Summary  Meghan Bowman WUJ:[id number] DOB: [birth year] DOA: 10/02/2012  PCP: Dorrene German, MD  Admit date: 10/02/2012 Discharge date: 10/03/2012  Recommendations for Outpatient Follow-up:  1. Pt will need to follow up with PCP in 1 weeks post discharge 2. Please obtain BMP to evaluate electrolytes and kidney function 3. Please f/u with echocardiogram   Discharge Diagnoses:  Principal Problem:   Chest pain Active Problems:   HTN (hypertension)   Hypokalemia  atypical chest pain -pain likely musculoskeletal as pain worsens with certain movement -EKG sinus rhythm with nonspecific T-wave changes which were unchanged from previous EKGs -Troponins negative x4 -Echocardiogram--pending at time of d/c -Pain unrelieved with nitroglycerin -Patient has opioid seeking behavior -Urine drug screen is negative -Wells' score is zero Hypertension -Blood pressure has been labile during the admission -Patient is not on any antihypertensives at home -pt was started on lisinopril which will be continued -pt need to follow up with pcp for BMP and BP check in one week Dyslipidemia -LDL 112, triglycerides 238 -Discussed with some modification -TSH 2.023 Discharge Condition: stable  Disposition: Home   Diet:cardiac Wt Readings from Last 3 Encounters:  10/02/12 98.703 kg (217 lb 9.6 oz)  07/25/12 97.3 kg (214 lb 8.1 oz)  11/02/11 92.534 kg (204 lb)    History of present illness:  52 year old female presenting with two-day history of substernal chest discomfort that radiates to her left arm with numbness and tingling in her fingers. She states that the discomfort occurs at rest as well as occasionally with exertion. She was given sublingual nitroglycerin without any effect on her chest discomfort. This discomfort was relieved only with opioid therapy. She denied any recent ill he to use all the patient has a history of cocaine abuse. Urine drug screen was negative. The patient  denied any fevers, chills, coughing, hemoptysis. The troponins were negative x4. EKG did not show any acute ST-T wave changes. The patient remained hemodynamically stable. Oxygen saturation is 100% on room air.    Discharge Exam: Filed Vitals:   10/03/12 0517  BP: 101/55  Pulse: 57  Temp: 97.1 F (36.2 C)  Resp: 22   Filed Vitals:   10/02/12 2048 10/02/12 2125 10/03/12 0227 10/03/12 0517  BP: 140/75  122/71 101/55  Pulse: 61  61 57  Temp: 96.1 F (35.6 C)   97.1 F (36.2 C)  TempSrc: Axillary   Oral  Resp: 20   22  Height:      Weight:      SpO2: 98% 96% 96% 96%   General: A&O x 3, NAD, pleasant, cooperative Cardiovascular: RRR, no rub, no gallop, no S3 Respiratory: CTAB, no wheeze, no rhonchi Abdomen:soft, nontender, nondistended, positive bowel sounds Extremities: No edema, No lymphangitis, no petechiae  Discharge Instructions     Medication List    ASK your doctor about these medications       beclomethasone 80 MCG/ACT inhaler  Commonly known as:  QVAR  Inhale 1 puff into the lungs as needed (short nees of breath).     budesonide-formoterol 80-4.5 MCG/ACT inhaler  Commonly known as:  SYMBICORT  Inhale 2 puffs into the lungs 2 (two) times daily.     cetirizine 10 MG tablet  Commonly known as:  ZYRTEC  Take 1 tablet (10 mg total) by mouth daily.     fluticasone 50 MCG/ACT nasal spray  Commonly known as:  FLONASE  Place 2 sprays into the nose daily.     ibuprofen 800 MG tablet  Commonly known as:  ADVIL,MOTRIN  Take 800 mg by mouth every 8 (eight) hours as needed for pain (pain).     sertraline 100 MG tablet  Commonly known as:  ZOLOFT  Take 200 mg by mouth every evening. Take at 7:30pm         The results of significant diagnostics from this hospitalization (including imaging, microbiology, ancillary and laboratory) are listed below for reference.    Significant Diagnostic Studies: Dg Chest 2 View  10/02/2012   *RADIOLOGY REPORT*  Clinical Data:  Chest pain.  CHEST - 2 VIEW  Comparison: Multiple priors, most recent 10/01/2011  Findings: Cardiomediastinal silhouette is unchanged and normal.  No focal infiltrate or edema.  No pleural effusion or pneumothorax. No acute osseous abnormality.  IMPRESSION: No acute cardiopulmonary abnormality.   Original Report Authenticated By: Jerene Dilling, M.D.     Microbiology: No results found for this or any previous visit (from the past 240 hour(s)).   Labs: Basic Metabolic Panel:  Recent Labs Lab 10/02/12 1405 10/03/12 0451  NA 137 140  K 3.2* 3.8  CL 104 104  CO2 24 27  GLUCOSE 93 108*  BUN 8 11  CREATININE 0.55 0.65  CALCIUM 9.1 9.1   Liver Function Tests:  Recent Labs Lab 10/02/12 1405  AST 15  ALT 10  ALKPHOS 74  BILITOT 0.2*  PROT 6.9  ALBUMIN 3.9   No results found for this basename: LIPASE, AMYLASE,  in the last 168 hours No results found for this basename: AMMONIA,  in the last 168 hours CBC:  Recent Labs Lab 10/02/12 1405 10/03/12 0451  WBC 9.0 9.2  NEUTROABS 5.0  --   HGB 12.4 11.8*  HCT 35.9* 35.8*  MCV 87.1 90.2  PLT 224 259   Cardiac Enzymes:  Recent Labs Lab 10/02/12 1405 10/02/12 1746 10/02/12 2248 10/03/12 0451  TROPONINI <0.30 <0.30 <0.30 <0.30   BNP: No components found with this basename: POCBNP,  CBG: No results found for this basename: GLUCAP,  in the last 168 hours  Time coordinating discharge:  Greater than 30 minutes  Signed:  Briceida Rasberry, DO [facility] Pager: [phone] 10/03/2012, 7:50 AM

## 2013-06-13 ENCOUNTER — Other Ambulatory Visit: Payer: Self-pay | Admitting: Internal Medicine

## 2013-06-13 DIAGNOSIS — N644 Mastodynia: Secondary | ICD-10-CM

## 2013-06-25 ENCOUNTER — Ambulatory Visit
Admission: RE | Admit: 2013-06-25 | Discharge: 2013-06-25 | Disposition: A | Discharge status: Home / Self Care | Payer: Medicaid Other | Source: Ambulatory Visit | Attending: Internal Medicine | Admitting: Internal Medicine

## 2013-06-25 DIAGNOSIS — N644 Mastodynia: Secondary | ICD-10-CM

## 2013-08-07 ENCOUNTER — Emergency Department (HOSPITAL_COMMUNITY)
Admission: EM | Admit: 2013-08-07 | Discharge: 2013-08-07 | Disposition: A | Discharge status: Home / Self Care | Payer: Medicaid Other | Attending: Emergency Medicine | Admitting: Emergency Medicine

## 2013-08-07 ENCOUNTER — Encounter (HOSPITAL_COMMUNITY): Payer: Self-pay | Admitting: Emergency Medicine

## 2013-08-07 ENCOUNTER — Emergency Department (HOSPITAL_COMMUNITY): Payer: Medicaid Other

## 2013-08-07 DIAGNOSIS — E669 Obesity, unspecified: Secondary | ICD-10-CM | POA: Insufficient documentation

## 2013-08-07 DIAGNOSIS — F411 Generalized anxiety disorder: Secondary | ICD-10-CM | POA: Insufficient documentation

## 2013-08-07 DIAGNOSIS — Z79899 Other long term (current) drug therapy: Secondary | ICD-10-CM | POA: Diagnosis not present

## 2013-08-07 DIAGNOSIS — IMO0002 Reserved for concepts with insufficient information to code with codable children: Secondary | ICD-10-CM | POA: Insufficient documentation

## 2013-08-07 DIAGNOSIS — F4310 Post-traumatic stress disorder, unspecified: Secondary | ICD-10-CM | POA: Insufficient documentation

## 2013-08-07 DIAGNOSIS — F29 Unspecified psychosis not due to a substance or known physiological condition: Secondary | ICD-10-CM | POA: Insufficient documentation

## 2013-08-07 DIAGNOSIS — R42 Dizziness and giddiness: Secondary | ICD-10-CM | POA: Insufficient documentation

## 2013-08-07 DIAGNOSIS — R0789 Other chest pain: Secondary | ICD-10-CM | POA: Insufficient documentation

## 2013-08-07 DIAGNOSIS — Z87828 Personal history of other (healed) physical injury and trauma: Secondary | ICD-10-CM | POA: Insufficient documentation

## 2013-08-07 DIAGNOSIS — R5383 Other fatigue: Secondary | ICD-10-CM

## 2013-08-07 DIAGNOSIS — J45909 Unspecified asthma, uncomplicated: Secondary | ICD-10-CM | POA: Insufficient documentation

## 2013-08-07 DIAGNOSIS — R5381 Other malaise: Secondary | ICD-10-CM | POA: Insufficient documentation

## 2013-08-07 DIAGNOSIS — F17200 Nicotine dependence, unspecified, uncomplicated: Secondary | ICD-10-CM | POA: Insufficient documentation

## 2013-08-07 DIAGNOSIS — R079 Chest pain, unspecified: Secondary | ICD-10-CM | POA: Diagnosis present

## 2013-08-07 LAB — BASIC METABOLIC PANEL
Anion gap: 18 — ABNORMAL HIGH (ref 5–15)
BUN: 7 mg/dL (ref 6–23)
CHLORIDE: 101 meq/L (ref 96–112)
CO2: 23 meq/L (ref 19–32)
Calcium: 9.3 mg/dL (ref 8.4–10.5)
Creatinine, Ser: 0.72 mg/dL (ref 0.50–1.10)
GFR calc Af Amer: >90 mL/min (ref >90)
GFR calc non Af Amer: >90 mL/min (ref >90)
GLUCOSE: 110 mg/dL — AB (ref 70–99)
Potassium: 3.4 mEq/L — ABNORMAL LOW (ref 3.7–5.3)
SODIUM: 142 meq/L (ref 137–147)

## 2013-08-07 LAB — I-STAT TROPONIN, ED: Troponin i, poc: 0 ng/mL (ref 0.00–0.08)

## 2013-08-07 LAB — CBC
HEMATOCRIT: 39.3 % (ref 36.0–46.0)
HEMOGLOBIN: 12.8 g/dL (ref 12.0–15.0)
MCH: 29.7 pg (ref 26.0–34.0)
MCHC: 32.6 g/dL (ref 30.0–36.0)
MCV: 91.2 fL (ref 78.0–100.0)
Platelets: 277 10*3/uL (ref 150–400)
RBC: 4.31 MIL/uL (ref 3.87–5.11)
RDW: 12.8 % (ref 11.5–15.5)
WBC: 9.5 10*3/uL (ref 4.0–10.5)

## 2013-08-07 MED ORDER — PANTOPRAZOLE SODIUM 40 MG PO TBEC: 40.0000 mg | DELAYED_RELEASE_TABLET | Freq: Every day | ORAL | Status: DC

## 2013-08-07 MED ORDER — MORPHINE SULFATE 4 MG/ML IJ SOLN
4.0000 mg | Freq: Once | INTRAMUSCULAR | Status: DC
  Filled 2013-08-07: qty 1

## 2013-08-07 MED ORDER — GI COCKTAIL ~~LOC~~
30.0000 mL | Freq: Once | ORAL | Status: AC
  Administered 2013-08-07: 30 mL via ORAL
  Filled 2013-08-07: qty 30

## 2013-08-07 MED ORDER — IBUPROFEN 200 MG PO TABS
600.0000 mg | ORAL_TABLET | Freq: Once | ORAL | Status: AC
  Administered 2013-08-07: 600 mg via ORAL
  Filled 2013-08-07: qty 3

## 2013-08-07 NOTE — ED Notes (Signed)
MD at the bedside. Patient returned from Xray.
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
## 2013-08-07 NOTE — Discharge Instructions (Signed)
Your chest pain is likely from gastroesophageal reflux disease. Please take protonix to help you reduce your stomach acid 30 minutes before breakfast.  Please talk to your GI doctor to see if you can do endoscopy during your colonoscopy appointment.  Follow up with your primary care doctor.    Gastroesophageal Reflux Disease, Adult Gastroesophageal reflux disease (GERD) happens when acid from your stomach flows up into the esophagus. When acid comes in contact with the esophagus, the acid causes soreness (inflammation) in the esophagus. Over time, GERD may create small holes (ulcers) in the lining of the esophagus. CAUSES   Increased body weight. This puts pressure on the stomach, making acid rise from the stomach into the esophagus.  Smoking. This increases acid production in the stomach.  Drinking alcohol. This causes decreased pressure in the lower esophageal sphincter (valve or ring of muscle between the esophagus and stomach), allowing acid from the stomach into the esophagus.  Late evening meals and a full stomach. This increases pressure and acid production in the stomach.  A malformed lower esophageal sphincter. Sometimes, no cause is found. SYMPTOMS   Burning pain in the lower part of the mid-chest behind the breastbone and in the mid-stomach area. This may occur twice a week or more often.  Trouble swallowing.  Sore throat.  Dry cough.  Asthma-like symptoms including chest tightness, shortness of breath, or wheezing. DIAGNOSIS  Your caregiver may be able to diagnose GERD based on your symptoms. In some cases, X-rays and other tests may be done to check for complications or to check the condition of your stomach and esophagus. TREATMENT  Your caregiver may recommend over-the-counter or prescription medicines to help decrease acid production. Ask your caregiver before starting or adding any new medicines.  HOME CARE INSTRUCTIONS   Change the factors that you can  control. Ask your caregiver for guidance concerning weight loss, quitting smoking, and alcohol consumption.  Avoid foods and drinks that make your symptoms worse, such as:  Caffeine or alcoholic drinks.  Chocolate.  Peppermint or mint flavorings.  Garlic and onions.  Spicy foods.  Citrus fruits, such as oranges, lemons, or limes.  Tomato-based foods such as sauce, chili, salsa, and pizza.  Fried and fatty foods.  Avoid lying down for the 3 hours prior to your bedtime or prior to taking a nap.  Eat small, frequent meals instead of large meals.  Wear loose-fitting clothing. Do not wear anything tight around your waist that causes pressure on your stomach.  Raise the head of your bed 6 to 8 inches with wood blocks to help you sleep. Extra pillows will not help.  Only take over-the-counter or prescription medicines for pain, discomfort, or fever as directed by your caregiver.  Do not take aspirin, ibuprofen, or other nonsteroidal anti-inflammatory drugs (NSAIDs). SEEK IMMEDIATE MEDICAL CARE IF:   You have pain in your arms, neck, jaw, teeth, or back.  Your pain increases or changes in intensity or duration.  You develop nausea, vomiting, or sweating (diaphoresis).  You develop shortness of breath, or you faint.  Your vomit is green, yellow, black, or looks like coffee grounds or blood.  Your stool is red, bloody, or black. These symptoms could be signs of other problems, such as heart disease, gastric bleeding, or esophageal bleeding. MAKE SURE YOU:   Understand these instructions.  Will watch your condition.  Will get help right away if you are not doing well or get worse. Document Released: 10/21/2004 Document Revised: 04/05/2011 Document Reviewed:  07/31/2010 ExitCare Patient Information 2015 [employer] [place]. This information is not intended to replace advice given to you by your health care provider. Make sure you discuss any questions you have with your health  care provider.

## 2013-08-08 ENCOUNTER — Other Ambulatory Visit: Payer: Self-pay | Admitting: Gastroenterology

## 2013-08-08 DIAGNOSIS — R1310 Dysphagia, unspecified: Secondary | ICD-10-CM

## 2013-08-09 ENCOUNTER — Ambulatory Visit
Admission: RE | Admit: 2013-08-09 | Discharge: 2013-08-09 | Disposition: A | Discharge status: Home / Self Care | Payer: Medicaid Other | Source: Ambulatory Visit | Attending: Gastroenterology | Admitting: Gastroenterology

## 2013-08-09 DIAGNOSIS — R1310 Dysphagia, unspecified: Secondary | ICD-10-CM

## 2013-08-11 NOTE — ED Provider Notes (Signed)
I saw and evaluated the patient, reviewed the resident's note and I agree with the findings and plan.   EKG Interpretation   Date/Time:  Tuesday August 07 2013 13:55:06 EDT Ventricular Rate:  83 PR Interval:  144 QRS Duration: 96 QT Interval:  376 QTC Calculation: 441 R Axis:   14 Text Interpretation:  Normal sinus rhythm Nonspecific T wave abnormality  Confirmed by Juleen ChinaKOHUT  MD, STEPHEN ([id number]) on 08/07/2013 2:16:37 PM      Chest pain is more likely gastroesophageal reflux disease.  Doubt ACS.  Patient has symptoms of difficulty swallowing at times.  She'll likely need outpatient GI followup and may benefit from EGD.  Patient be placed on Prilosec.  Immediate relief after GI cocktail  Lyanne CoKevin M Ferman Basilio, MD 08/11/13 [id number]

## 2013-09-25 HISTORY — PX: ESOPHAGEAL DILATION: SHX303

## 2013-10-04 ENCOUNTER — Other Ambulatory Visit: Payer: Self-pay | Admitting: Gastroenterology

## 2013-11-01 ENCOUNTER — Other Ambulatory Visit: Payer: Self-pay | Admitting: Orthopedic Surgery

## 2013-11-05 ENCOUNTER — Encounter (HOSPITAL_COMMUNITY): Payer: Self-pay

## 2013-11-07 ENCOUNTER — Encounter (HOSPITAL_COMMUNITY)
Admission: RE | Admit: 2013-11-07 | Discharge: 2013-11-07 | Disposition: A | Discharge status: Home / Self Care | Payer: Medicaid Other | Source: Ambulatory Visit | Attending: Orthopedic Surgery | Admitting: Orthopedic Surgery

## 2013-11-07 ENCOUNTER — Encounter (HOSPITAL_COMMUNITY): Payer: Self-pay

## 2013-11-07 DIAGNOSIS — F4310 Post-traumatic stress disorder, unspecified: Secondary | ICD-10-CM | POA: Diagnosis not present

## 2013-11-07 DIAGNOSIS — F419 Anxiety disorder, unspecified: Secondary | ICD-10-CM | POA: Diagnosis not present

## 2013-11-07 DIAGNOSIS — K449 Diaphragmatic hernia without obstruction or gangrene: Secondary | ICD-10-CM | POA: Insufficient documentation

## 2013-11-07 DIAGNOSIS — J45909 Unspecified asthma, uncomplicated: Secondary | ICD-10-CM | POA: Insufficient documentation

## 2013-11-07 DIAGNOSIS — Z0000 Encounter for general adult medical examination without abnormal findings: Secondary | ICD-10-CM | POA: Insufficient documentation

## 2013-11-07 DIAGNOSIS — K219 Gastro-esophageal reflux disease without esophagitis: Secondary | ICD-10-CM | POA: Insufficient documentation

## 2013-11-07 DIAGNOSIS — M1712 Unilateral primary osteoarthritis, left knee: Secondary | ICD-10-CM | POA: Diagnosis not present

## 2013-11-07 DIAGNOSIS — G4730 Sleep apnea, unspecified: Secondary | ICD-10-CM | POA: Insufficient documentation

## 2013-11-07 HISTORY — DX: Personal history of other diseases of the digestive system: Z87.19

## 2013-11-07 HISTORY — DX: Gastro-esophageal reflux disease without esophagitis: K21.9

## 2013-11-07 HISTORY — DX: Sleep apnea, unspecified: G47.30

## 2013-11-07 LAB — CBC
HEMATOCRIT: 37.5 % (ref 36.0–46.0)
HEMOGLOBIN: 12.6 g/dL (ref 12.0–15.0)
MCH: 29.5 pg (ref 26.0–34.0)
MCHC: 33.6 g/dL (ref 30.0–36.0)
MCV: 87.8 fL (ref 78.0–100.0)
Platelets: 257 10*3/uL (ref 150–400)
RBC: 4.27 MIL/uL (ref 3.87–5.11)
RDW: 12.7 % (ref 11.5–15.5)
WBC: 6.5 10*3/uL (ref 4.0–10.5)

## 2013-11-07 LAB — BASIC METABOLIC PANEL
ANION GAP: 14 (ref 5–15)
BUN: 7 mg/dL (ref 6–23)
CHLORIDE: 105 meq/L (ref 96–112)
CO2: 23 meq/L (ref 19–32)
Calcium: 9.5 mg/dL (ref 8.4–10.5)
Creatinine, Ser: 0.69 mg/dL (ref 0.50–1.10)
GFR calc non Af Amer: >90 mL/min (ref >90)
Glucose, Bld: 109 mg/dL — ABNORMAL HIGH (ref 70–99)
POTASSIUM: 3.6 meq/L — AB (ref 3.7–5.3)
Sodium: 142 mEq/L (ref 137–147)

## 2013-11-07 LAB — PROTIME-INR
INR: 1.06 (ref 0.00–1.49)
PROTHROMBIN TIME: 13.9 s (ref 11.6–15.2)

## 2013-11-07 LAB — APTT: aPTT: 34 seconds (ref 24–37)

## 2013-11-07 LAB — SURGICAL PCR SCREEN
MRSA, PCR: NEGATIVE
Staphylococcus aureus: NEGATIVE

## 2013-11-07 NOTE — Pre-Procedure Instructions (Signed)
Albin FischerVictoria J Switalski  11/07/2013   Your procedure is scheduled on:  Tuesday October 20 th at 1040 AM  [facility] [phone] AM.  Call this number if you have problems the morning of surgery: [phone]   Remember:   Do not eat food or drink liquids after midnight.   Take these medicines the morning of surgery with A SIP OF WATER: Hydrocodone-acetaminophen if needed for pain, Protonix, Zoloft and Chantix. Use and Bring inhalers with you day of surgery. Stop Ibuprofen, Aspirin, Vitamins and herbal meds 5 days prior to surgery.   Do not wear jewelry, make-up or nail polish.  Do not wear lotions, powders, or perfumes. You may not wear deodorant.  Do not shave 48 hours prior to surgery.   Do not bring valuables to the hospital.  [facility] is not responsible   for any belongings or valuables.               Contacts, dentures or bridgework may not be worn into surgery.  Leave suitcase in the car. After surgery it may be brought to your room.  For patients admitted to the hospital, discharge time is determined by your treatment team.               Patients discharged the day of surgery will not be allowed to drive home.    Special Instructions: [facility] - Preparing for Surgery  Before surgery, you can play an important role.  Because skin is not sterile, your skin needs to be as free of germs as possible.  You can reduce the number of germs on you skin by washing with CHG (chlorahexidine gluconate) soap before surgery.  CHG is an antiseptic cleaner which kills germs and bonds with the skin to continue killing germs even after washing.  Please DO NOT use if you have an allergy to CHG or antibacterial soaps.  If your skin becomes reddened/irritated stop using the CHG and inform your nurse when you arrive at Short Stay.  Do not shave (including legs and underarms) for at least 48 hours prior to the first CHG shower.  You may shave your face.  Please follow these  instructions carefully:   1.  Shower with CHG Soap the night before surgery and the                                morning of Surgery.  2.  If you choose to wash your hair, wash your hair first as usual with your       normal shampoo.  3.  After you shampoo, rinse your hair and body thoroughly to remove the                      Shampoo.  4.  Use CHG as you would any other liquid soap.  You can apply chg directly       to the skin and wash gently with scrungie or a clean washcloth.  5.  Apply the CHG Soap to your body ONLY FROM THE NECK DOWN.        Do not use on open wounds or open sores.  Avoid contact with your eyes,       ears, mouth and genitals (private parts).  Wash genitals (private parts)       with your normal soap.  6.  Wash thoroughly, paying special  attention to the area where your surgery        will be performed.  7.  Thoroughly rinse your body with warm water from the neck down.  8.  DO NOT shower/wash with your normal soap after using and rinsing off       the CHG Soap.  9.  Pat yourself dry with a clean towel.            10.  Wear clean pajamas.            11.  Place clean sheets on your bed the night of your first shower and do not        sleep with pets.  Day of Surgery  Do not apply any lotions/deoderants the morning of surgery.  Please wear clean clothes to the hospital/surgery center.      Please read over the following fact sheets that you were given: Pain Booklet, Coughing and Deep Breathing, Blood Transfusion Information, MRSA Information and Surgical Site Infection Prevention

## 2013-11-09 ENCOUNTER — Other Ambulatory Visit: Payer: Self-pay

## 2013-11-12 MED ORDER — CEFAZOLIN SODIUM-DEXTROSE 2-3 GM-% IV SOLR
2.0000 g | INTRAVENOUS | Status: AC
  Administered 2013-11-13: 2 g via INTRAVENOUS
  Filled 2013-11-12: qty 50

## 2013-11-13 ENCOUNTER — Encounter (HOSPITAL_COMMUNITY)
Admission: RE | Disposition: A | Discharge status: Home Health | Payer: Self-pay | Source: Ambulatory Visit | Attending: Orthopedic Surgery

## 2013-11-13 ENCOUNTER — Inpatient Hospital Stay (HOSPITAL_COMMUNITY): Payer: Medicaid Other

## 2013-11-13 ENCOUNTER — Inpatient Hospital Stay (HOSPITAL_COMMUNITY)
Admission: RE | Admit: 2013-11-13 | Discharge: 2013-11-15 | DRG: 470 | Disposition: A | Discharge status: Home Health | Payer: Medicaid Other | Source: Ambulatory Visit | Attending: Orthopedic Surgery | Admitting: Orthopedic Surgery

## 2013-11-13 ENCOUNTER — Encounter (HOSPITAL_COMMUNITY): Payer: Self-pay | Admitting: Orthopedic Surgery

## 2013-11-13 ENCOUNTER — Inpatient Hospital Stay (HOSPITAL_COMMUNITY): Payer: Medicaid Other | Admitting: Anesthesiology

## 2013-11-13 ENCOUNTER — Encounter (HOSPITAL_COMMUNITY): Payer: Medicaid Other | Admitting: Anesthesiology

## 2013-11-13 DIAGNOSIS — F4310 Post-traumatic stress disorder, unspecified: Secondary | ICD-10-CM | POA: Diagnosis not present

## 2013-11-13 DIAGNOSIS — G8918 Other acute postprocedural pain: Secondary | ICD-10-CM | POA: Diagnosis not present

## 2013-11-13 DIAGNOSIS — Z8249 Family history of ischemic heart disease and other diseases of the circulatory system: Secondary | ICD-10-CM | POA: Diagnosis not present

## 2013-11-13 DIAGNOSIS — Z79899 Other long term (current) drug therapy: Secondary | ICD-10-CM | POA: Diagnosis not present

## 2013-11-13 DIAGNOSIS — J45909 Unspecified asthma, uncomplicated: Secondary | ICD-10-CM | POA: Diagnosis not present

## 2013-11-13 DIAGNOSIS — M179 Osteoarthritis of knee, unspecified: Secondary | ICD-10-CM | POA: Diagnosis present

## 2013-11-13 DIAGNOSIS — M1732 Unilateral post-traumatic osteoarthritis, left knee: Principal | ICD-10-CM | POA: Diagnosis present

## 2013-11-13 DIAGNOSIS — M1712 Unilateral primary osteoarthritis, left knee: Secondary | ICD-10-CM

## 2013-11-13 DIAGNOSIS — K219 Gastro-esophageal reflux disease without esophagitis: Secondary | ICD-10-CM | POA: Diagnosis present

## 2013-11-13 DIAGNOSIS — M1710 Unilateral primary osteoarthritis, unspecified knee: Secondary | ICD-10-CM | POA: Diagnosis present

## 2013-11-13 DIAGNOSIS — Z7901 Long term (current) use of anticoagulants: Secondary | ICD-10-CM | POA: Diagnosis not present

## 2013-11-13 DIAGNOSIS — F17210 Nicotine dependence, cigarettes, uncomplicated: Secondary | ICD-10-CM | POA: Diagnosis present

## 2013-11-13 HISTORY — PX: TOTAL KNEE ARTHROPLASTY: SHX125

## 2013-11-13 HISTORY — DX: Migraine, unspecified, not intractable, without status migrainosus: G43.909

## 2013-11-13 HISTORY — DX: Unilateral primary osteoarthritis, left knee: M17.12

## 2013-11-13 SURGERY — ARTHROPLASTY, KNEE, TOTAL
Anesthesia: Regional | Site: Knee | Laterality: Left

## 2013-11-13 MED ORDER — ACETAMINOPHEN 325 MG PO TABS: 650.0000 mg | ORAL_TABLET | Freq: Four times a day (QID) | ORAL | Status: DC | PRN

## 2013-11-13 MED ORDER — FLUTICASONE PROPIONATE HFA 44 MCG/ACT IN AERO
2.0000 | INHALATION_SPRAY | Freq: Two times a day (BID) | RESPIRATORY_TRACT | Status: DC
  Filled 2013-11-13: qty 10.6

## 2013-11-13 MED ORDER — KETOROLAC TROMETHAMINE 15 MG/ML IJ SOLN
7.5000 mg | Freq: Four times a day (QID) | INTRAMUSCULAR | Status: DC
  Administered 2013-11-14 – 2013-11-15 (×2): 7.5 mg via INTRAVENOUS
  Filled 2013-11-13 (×3): qty 1

## 2013-11-13 MED ORDER — METHOCARBAMOL 1000 MG/10ML IJ SOLN
500.0000 mg | Freq: Four times a day (QID) | INTRAVENOUS | Status: DC | PRN
  Administered 2013-11-13: 500 mg via INTRAVENOUS
  Filled 2013-11-13: qty 5

## 2013-11-13 MED ORDER — ACETAMINOPHEN 650 MG RE SUPP: 650.0000 mg | Freq: Four times a day (QID) | RECTAL | Status: DC | PRN

## 2013-11-13 MED ORDER — HYDROMORPHONE HCL 1 MG/ML IJ SOLN
1.0000 mg | INTRAMUSCULAR | Status: DC | PRN
  Administered 2013-11-13 – 2013-11-15 (×9): 1 mg via INTRAVENOUS
  Filled 2013-11-13 (×8): qty 1

## 2013-11-13 MED ORDER — PHENYLEPHRINE 40 MCG/ML (10ML) SYRINGE FOR IV PUSH (FOR BLOOD PRESSURE SUPPORT)
PREFILLED_SYRINGE | INTRAVENOUS | Status: AC
  Filled 2013-11-13: qty 10

## 2013-11-13 MED ORDER — HYDROMORPHONE HCL 1 MG/ML IJ SOLN
INTRAMUSCULAR | Status: AC
  Filled 2013-11-13: qty 1

## 2013-11-13 MED ORDER — METOCLOPRAMIDE HCL 5 MG/ML IJ SOLN: 5.0000 mg | Freq: Three times a day (TID) | INTRAMUSCULAR | Status: DC | PRN

## 2013-11-13 MED ORDER — RIVAROXABAN 10 MG PO TABS: 10.0000 mg | ORAL_TABLET | Freq: Every day | ORAL | Status: DC

## 2013-11-13 MED ORDER — BUDESONIDE-FORMOTEROL FUMARATE 80-4.5 MCG/ACT IN AERO
2.0000 | INHALATION_SPRAY | Freq: Two times a day (BID) | RESPIRATORY_TRACT | Status: DC
  Filled 2013-11-13: qty 6.9

## 2013-11-13 MED ORDER — RIVAROXABAN 10 MG PO TABS
10.0000 mg | ORAL_TABLET | Freq: Every day | ORAL | Status: DC
  Administered 2013-11-14 – 2013-11-15 (×2): 10 mg via ORAL
  Filled 2013-11-13 (×3): qty 1

## 2013-11-13 MED ORDER — OXYCODONE HCL 5 MG PO TABS
ORAL_TABLET | ORAL | Status: AC
  Filled 2013-11-13: qty 2

## 2013-11-13 MED ORDER — FENTANYL CITRATE 0.05 MG/ML IJ SOLN
INTRAMUSCULAR | Status: AC
  Filled 2013-11-13: qty 5

## 2013-11-13 MED ORDER — METOCLOPRAMIDE HCL 10 MG PO TABS
5.0000 mg | ORAL_TABLET | Freq: Three times a day (TID) | ORAL | Status: DC | PRN
  Administered 2013-11-13: 10 mg via ORAL
  Filled 2013-11-13: qty 1

## 2013-11-13 MED ORDER — BISACODYL 10 MG RE SUPP: 10.0000 mg | Freq: Every day | RECTAL | Status: DC | PRN

## 2013-11-13 MED ORDER — KETOROLAC TROMETHAMINE 15 MG/ML IJ SOLN
7.5000 mg | Freq: Four times a day (QID) | INTRAMUSCULAR | Status: DC
  Administered 2013-11-13: 7.5 mg via INTRAVENOUS

## 2013-11-13 MED ORDER — ONDANSETRON HCL 4 MG/2ML IJ SOLN
INTRAMUSCULAR | Status: DC | PRN
  Administered 2013-11-13: 4 mg via INTRAVENOUS

## 2013-11-13 MED ORDER — POTASSIUM CHLORIDE IN NACL 20-0.45 MEQ/L-% IV SOLN
INTRAVENOUS | Status: DC
  Administered 2013-11-13: 21:00:00 via INTRAVENOUS
  Filled 2013-11-13 (×4): qty 1000

## 2013-11-13 MED ORDER — PANTOPRAZOLE SODIUM 40 MG PO TBEC
80.0000 mg | DELAYED_RELEASE_TABLET | Freq: Every day | ORAL | Status: DC
  Administered 2013-11-14 – 2013-11-15 (×2): 80 mg via ORAL
  Filled 2013-11-13: qty 2

## 2013-11-13 MED ORDER — OXYCODONE HCL 5 MG PO TABS: 5.0000 mg | ORAL_TABLET | Freq: Once | ORAL | Status: DC | PRN

## 2013-11-13 MED ORDER — ALUM & MAG HYDROXIDE-SIMETH 200-200-20 MG/5ML PO SUSP: 30.0000 mL | ORAL | Status: DC | PRN

## 2013-11-13 MED ORDER — LIDOCAINE HCL (CARDIAC) 20 MG/ML IV SOLN
INTRAVENOUS | Status: DC | PRN
  Administered 2013-11-13: 60 mg via INTRAVENOUS

## 2013-11-13 MED ORDER — SERTRALINE HCL 100 MG PO TABS
200.0000 mg | ORAL_TABLET | Freq: Every evening | ORAL | Status: DC
  Administered 2013-11-14: 200 mg via ORAL
  Filled 2013-11-13 (×2): qty 2

## 2013-11-13 MED ORDER — OXYCODONE HCL 5 MG PO TABS
ORAL_TABLET | ORAL | Status: AC
  Filled 2013-11-13: qty 1

## 2013-11-13 MED ORDER — PHENYLEPHRINE HCL 10 MG/ML IJ SOLN
INTRAMUSCULAR | Status: DC | PRN
  Administered 2013-11-13: 120 ug via INTRAVENOUS
  Administered 2013-11-13: 80 ug via INTRAVENOUS

## 2013-11-13 MED ORDER — SODIUM CHLORIDE 0.9 % IR SOLN
Status: DC | PRN
  Administered 2013-11-13: 1000 mL

## 2013-11-13 MED ORDER — OXYCODONE HCL 5 MG/5ML PO SOLN: 5.0000 mg | Freq: Once | ORAL | Status: DC | PRN

## 2013-11-13 MED ORDER — HYDROMORPHONE HCL 1 MG/ML IJ SOLN
0.2500 mg | INTRAMUSCULAR | Status: DC | PRN
Start: 2013-11-13 — End: 2013-11-13
  Administered 2013-11-13 (×5): 0.5 mg via INTRAVENOUS

## 2013-11-13 MED ORDER — BACLOFEN 10 MG PO TABS: 10.0000 mg | ORAL_TABLET | Freq: Three times a day (TID) | ORAL | Status: DC

## 2013-11-13 MED ORDER — OXYCODONE HCL 5 MG PO TABS
5.0000 mg | ORAL_TABLET | ORAL | Status: DC | PRN
  Administered 2013-11-13 (×2): 10 mg via ORAL
  Administered 2013-11-13: 5 mg via ORAL
  Administered 2013-11-14: 10 mg via ORAL
  Administered 2013-11-14: 5 mg via ORAL
  Administered 2013-11-14 – 2013-11-15 (×10): 10 mg via ORAL
  Filled 2013-11-13 (×13): qty 2

## 2013-11-13 MED ORDER — MIDAZOLAM HCL 2 MG/2ML IJ SOLN
INTRAMUSCULAR | Status: AC
  Filled 2013-11-13: qty 2

## 2013-11-13 MED ORDER — MAGNESIUM CITRATE PO SOLN: 1.0000 | Freq: Once | ORAL | Status: AC | PRN

## 2013-11-13 MED ORDER — POLYETHYLENE GLYCOL 3350 17 G PO PACK: 17.0000 g | PACK | Freq: Every day | ORAL | Status: DC | PRN

## 2013-11-13 MED ORDER — DEXMEDETOMIDINE HCL 200 MCG/2ML IV SOLN
INTRAVENOUS | Status: DC | PRN
  Administered 2013-11-13: 8 ug via INTRAVENOUS
  Administered 2013-11-13: 12 ug via INTRAVENOUS

## 2013-11-13 MED ORDER — ESMOLOL HCL 10 MG/ML IV SOLN
INTRAVENOUS | Status: AC
  Filled 2013-11-13: qty 10

## 2013-11-13 MED ORDER — ONDANSETRON HCL 4 MG PO TABS: 4.0000 mg | ORAL_TABLET | Freq: Three times a day (TID) | ORAL | Status: DC | PRN

## 2013-11-13 MED ORDER — VARENICLINE TARTRATE 1 MG PO TABS
1.0000 mg | ORAL_TABLET | Freq: Two times a day (BID) | ORAL | Status: DC
  Filled 2013-11-13 (×5): qty 1

## 2013-11-13 MED ORDER — ONDANSETRON HCL 4 MG/2ML IJ SOLN: 4.0000 mg | Freq: Four times a day (QID) | INTRAMUSCULAR | Status: DC | PRN

## 2013-11-13 MED ORDER — SODIUM CHLORIDE 0.9 % IR SOLN
Status: DC | PRN
  Administered 2013-11-13: 3000 mL

## 2013-11-13 MED ORDER — DOCUSATE SODIUM 100 MG PO CAPS
100.0000 mg | ORAL_CAPSULE | Freq: Two times a day (BID) | ORAL | Status: DC
  Administered 2013-11-14 – 2013-11-15 (×3): 100 mg via ORAL
  Filled 2013-11-13 (×4): qty 1

## 2013-11-13 MED ORDER — ROPIVACAINE HCL 5 MG/ML IJ SOLN
INTRAMUSCULAR | Status: DC | PRN
  Administered 2013-11-13: 30 mL via PERINEURAL

## 2013-11-13 MED ORDER — SENNA 8.6 MG PO TABS
1.0000 | ORAL_TABLET | Freq: Two times a day (BID) | ORAL | Status: DC
  Administered 2013-11-14 – 2013-11-15 (×3): 8.6 mg via ORAL
  Filled 2013-11-13 (×5): qty 1

## 2013-11-13 MED ORDER — ONDANSETRON HCL 4 MG PO TABS: 4.0000 mg | ORAL_TABLET | Freq: Four times a day (QID) | ORAL | Status: DC | PRN

## 2013-11-13 MED ORDER — METHOCARBAMOL 1000 MG/10ML IJ SOLN
500.0000 mg | INTRAVENOUS | Status: AC
  Administered 2013-11-13: 500 mg via INTRAVENOUS
  Filled 2013-11-13: qty 5

## 2013-11-13 MED ORDER — PHENOL 1.4 % MT LIQD: 1.0000 | OROMUCOSAL | Status: DC | PRN

## 2013-11-13 MED ORDER — OXYCODONE-ACETAMINOPHEN 10-325 MG PO TABS
1.0000 | ORAL_TABLET | Freq: Four times a day (QID) | ORAL | Status: DC | PRN
Start: 2013-11-13 — End: 2014-01-08

## 2013-11-13 MED ORDER — METHOCARBAMOL 500 MG PO TABS
500.0000 mg | ORAL_TABLET | Freq: Four times a day (QID) | ORAL | Status: DC | PRN
  Administered 2013-11-14 – 2013-11-15 (×6): 500 mg via ORAL
  Filled 2013-11-13 (×6): qty 1

## 2013-11-13 MED ORDER — DEXAMETHASONE SODIUM PHOSPHATE 10 MG/ML IJ SOLN
10.0000 mg | Freq: Once | INTRAMUSCULAR | Status: AC
  Administered 2013-11-14: 10 mg via INTRAVENOUS
  Filled 2013-11-13: qty 1

## 2013-11-13 MED ORDER — PROPOFOL 10 MG/ML IV BOLUS
INTRAVENOUS | Status: DC | PRN
  Administered 2013-11-13: 200 mg via INTRAVENOUS

## 2013-11-13 MED ORDER — SENNA-DOCUSATE SODIUM 8.6-50 MG PO TABS: 2.0000 | ORAL_TABLET | Freq: Every day | ORAL | Status: DC

## 2013-11-13 MED ORDER — CEFAZOLIN SODIUM-DEXTROSE 2-3 GM-% IV SOLR
2.0000 g | Freq: Four times a day (QID) | INTRAVENOUS | Status: AC
  Administered 2013-11-13 – 2013-11-14 (×2): 2 g via INTRAVENOUS
  Filled 2013-11-13 (×2): qty 50

## 2013-11-13 MED ORDER — LACTATED RINGERS IV SOLN
INTRAVENOUS | Status: DC
  Administered 2013-11-13 (×2): via INTRAVENOUS

## 2013-11-13 MED ORDER — HYDROMORPHONE HCL 1 MG/ML IJ SOLN
INTRAMUSCULAR | Status: AC
  Administered 2013-11-13: 1 mg
  Filled 2013-11-13: qty 1

## 2013-11-13 MED ORDER — MIDAZOLAM HCL 2 MG/2ML IJ SOLN
INTRAMUSCULAR | Status: AC
  Administered 2013-11-13: 2 mg
  Filled 2013-11-13: qty 4

## 2013-11-13 MED ORDER — FENTANYL CITRATE 0.05 MG/ML IJ SOLN
INTRAMUSCULAR | Status: AC
  Administered 2013-11-13: 100 ug
  Filled 2013-11-13: qty 2

## 2013-11-13 MED ORDER — MENTHOL 3 MG MT LOZG: 1.0000 | LOZENGE | OROMUCOSAL | Status: DC | PRN

## 2013-11-13 MED ORDER — GLYCOPYRROLATE 0.2 MG/ML IJ SOLN
INTRAMUSCULAR | Status: DC | PRN
  Administered 2013-11-13 (×2): .2 mg via INTRAVENOUS

## 2013-11-13 MED ORDER — KETOROLAC TROMETHAMINE 15 MG/ML IJ SOLN
INTRAMUSCULAR | Status: AC
  Filled 2013-11-13: qty 1

## 2013-11-13 MED ORDER — ESMOLOL HCL 10 MG/ML IV SOLN
INTRAVENOUS | Status: DC | PRN
  Administered 2013-11-13: 20 mg via INTRAVENOUS

## 2013-11-13 MED ORDER — DIPHENHYDRAMINE HCL 12.5 MG/5ML PO ELIX
12.5000 mg | ORAL_SOLUTION | ORAL | Status: DC | PRN
  Administered 2013-11-14 – 2013-11-15 (×2): 25 mg via ORAL
  Filled 2013-11-13 (×2): qty 10

## 2013-11-13 MED ORDER — MIDAZOLAM HCL 5 MG/5ML IJ SOLN
INTRAMUSCULAR | Status: DC | PRN
  Administered 2013-11-13: 2 mg via INTRAVENOUS

## 2013-11-13 MED ORDER — FENTANYL CITRATE 0.05 MG/ML IJ SOLN
INTRAMUSCULAR | Status: DC | PRN
  Administered 2013-11-13 (×3): 50 ug via INTRAVENOUS
  Administered 2013-11-13 (×3): 100 ug via INTRAVENOUS
  Administered 2013-11-13: 50 ug via INTRAVENOUS

## 2013-11-13 SURGICAL SUPPLY — 64 items
APL SKNCLS STERI-STRIP NONHPOA (GAUZE/BANDAGES/DRESSINGS) ×1
BANDAGE ELASTIC 6 VELCRO ST LF (GAUZE/BANDAGES/DRESSINGS) ×6 IMPLANT
BANDAGE ESMARK 6X9 LF (GAUZE/BANDAGES/DRESSINGS) ×1 IMPLANT
BENZOIN TINCTURE PRP APPL 2/3 (GAUZE/BANDAGES/DRESSINGS) ×3 IMPLANT
BLADE SAG 18X100X1.27 (BLADE) ×3 IMPLANT
BLADE SAW RECIP 87.9 MT (BLADE) ×3 IMPLANT
BLADE SAW SGTL 13X75X1.27 (BLADE) ×3 IMPLANT
BNDG CMPR 9X6 STRL LF SNTH (GAUZE/BANDAGES/DRESSINGS) ×1
BNDG ESMARK 6X9 LF (GAUZE/BANDAGES/DRESSINGS) ×3
BOOTCOVER CLEANROOM LRG (PROTECTIVE WEAR) ×2 IMPLANT
BOWL SMART MIX CTS (DISPOSABLE) ×3 IMPLANT
CAPT RP KNEE ×2 IMPLANT
CEMENT HV SMART SET (Cement) ×6 IMPLANT
CLOSURE STERI-STRIP 1/2X4 (GAUZE/BANDAGES/DRESSINGS)
CLOSURE WOUND 1/2 X4 (GAUZE/BANDAGES/DRESSINGS) ×2
CLSR STERI-STRIP ANTIMIC 1/2X4 (GAUZE/BANDAGES/DRESSINGS) ×1 IMPLANT
COVER SURGICAL LIGHT HANDLE (MISCELLANEOUS) ×3 IMPLANT
CUFF TOURNIQUET SINGLE 34IN LL (TOURNIQUET CUFF) ×2 IMPLANT
DRAPE EXTREMITY T 121X128X90 (DRAPE) ×3 IMPLANT
DRAPE PROXIMA HALF (DRAPES) ×6 IMPLANT
DRAPE U-SHAPE 47X51 STRL (DRAPES) ×3 IMPLANT
DURAPREP 26ML APPLICATOR (WOUND CARE) ×3 IMPLANT
ELECT CAUTERY BLADE 6.4 (BLADE) ×3 IMPLANT
ELECT REM PT RETURN 9FT ADLT (ELECTROSURGICAL) ×3
ELECTRODE REM PT RTRN 9FT ADLT (ELECTROSURGICAL) ×1 IMPLANT
FACESHIELD WRAPAROUND (MASK) ×3 IMPLANT
FACESHIELD WRAPAROUND OR TEAM (MASK) ×1 IMPLANT
GAUZE SPONGE 4X4 12PLY STRL (GAUZE/BANDAGES/DRESSINGS) ×1 IMPLANT
GLOVE BIOGEL PI ORTHO PRO SZ8 (GLOVE) ×4
GLOVE ORTHO TXT STRL SZ7.5 (GLOVE) ×3 IMPLANT
GLOVE PI ORTHO PRO STRL SZ8 (GLOVE) ×2 IMPLANT
GLOVE SURG ORTHO 8.0 STRL STRW (GLOVE) ×3 IMPLANT
GOWN STRL REUS W/ TWL XL LVL3 (GOWN DISPOSABLE) ×1 IMPLANT
GOWN STRL REUS W/TWL 2XL LVL3 (GOWN DISPOSABLE) ×3 IMPLANT
GOWN STRL REUS W/TWL XL LVL3 (GOWN DISPOSABLE) ×3
HANDPIECE INTERPULSE COAX TIP (DISPOSABLE) ×3
HOOD PEEL AWAY FACE SHEILD DIS (HOOD) ×6 IMPLANT
IMMOBILIZER KNEE 22 (SOFTGOODS) ×3 IMPLANT
KIT BASIN OR (CUSTOM PROCEDURE TRAY) ×3 IMPLANT
KIT ROOM TURNOVER OR (KITS) ×3 IMPLANT
MANIFOLD NEPTUNE II (INSTRUMENTS) ×3 IMPLANT
NS IRRIG 1000ML POUR BTL (IV SOLUTION) ×3 IMPLANT
PACK TOTAL JOINT (CUSTOM PROCEDURE TRAY) ×3 IMPLANT
PAD ABD 8X10 STRL (GAUZE/BANDAGES/DRESSINGS) ×3 IMPLANT
PAD ARMBOARD 7.5X6 YLW CONV (MISCELLANEOUS) ×4 IMPLANT
PAD CAST 4YDX4 CTTN HI CHSV (CAST SUPPLIES) ×1 IMPLANT
PADDING CAST COTTON 4X4 STRL (CAST SUPPLIES) ×3
PADDING CAST COTTON 6X4 STRL (CAST SUPPLIES) ×3 IMPLANT
SET HNDPC FAN SPRY TIP SCT (DISPOSABLE) ×1 IMPLANT
SPONGE GAUZE 4X4 12PLY STER LF (GAUZE/BANDAGES/DRESSINGS) ×2 IMPLANT
STAPLER VISISTAT 35W (STAPLE) ×3 IMPLANT
STRIP CLOSURE SKIN 1/2X4 (GAUZE/BANDAGES/DRESSINGS) ×2 IMPLANT
SUCTION FRAZIER TIP 10 FR DISP (SUCTIONS) ×3 IMPLANT
SUT MNCRL AB 4-0 PS2 18 (SUTURE) ×2 IMPLANT
SUT VIC AB 0 CT1 27 (SUTURE) ×9
SUT VIC AB 0 CT1 27XBRD ANBCTR (SUTURE) ×1 IMPLANT
SUT VIC AB 2-0 CT1 27 (SUTURE) ×6
SUT VIC AB 2-0 CT1 TAPERPNT 27 (SUTURE) ×1 IMPLANT
SUT VIC AB 3-0 SH 8-18 (SUTURE) ×4 IMPLANT
SYR 30ML LL (SYRINGE) ×3 IMPLANT
TOWEL OR 17X24 6PK STRL BLUE (TOWEL DISPOSABLE) ×3 IMPLANT
TOWEL OR 17X26 10 PK STRL BLUE (TOWEL DISPOSABLE) ×3 IMPLANT
TRAY FOLEY CATH 16FRSI W/METER (SET/KITS/TRAYS/PACK) IMPLANT
WATER STERILE IRR 1000ML POUR (IV SOLUTION) ×6 IMPLANT

## 2013-11-13 NOTE — Anesthesia Postprocedure Evaluation (Signed)
  Anesthesia Post-op Note  Patient: Meghan Bowman  Procedure(s) Performed: Procedure(s): LEFT TOTAL KNEE ARTHROPLASTY (Left)  Patient Location: PACU  Anesthesia Type:General and block  Level of Consciousness: awake and alert   Airway and Oxygen Therapy: Patient Spontanous Breathing  Post-op Pain: mild  Post-op Assessment: Post-op Vital signs reviewed, Patient's Cardiovascular Status Stable and Respiratory Function Stable  Post-op Vital Signs: Reviewed  Filed Vitals:   11/13/13 1338  BP: 108/71  Pulse:   Temp: 36.4 C  Resp:     Complications: No apparent anesthesia complications

## 2013-11-13 NOTE — Anesthesia Preprocedure Evaluation (Addendum)
Anesthesia Evaluation  Patient identified by MRN, date of birth, ID band Patient awake    Reviewed: Allergy & Precautions, H&P , NPO status , Patient's Chart, lab work & pertinent test results  Airway Mallampati: II TM Distance: >3 FB Neck ROM: Full    Dental no notable dental hx. (+) Teeth Intact, Dental Advisory Given   Pulmonary shortness of breath, asthma , Current Smoker,  breath sounds clear to auscultation  Pulmonary exam normal       Cardiovascular negative cardio ROS  Rhythm:Regular Rate:Normal     Neuro/Psych  Headaches, PSYCHIATRIC DISORDERS Anxiety    GI/Hepatic Neg liver ROS, hiatal hernia, GERD-  Medicated and Controlled,  Endo/Other  negative endocrine ROS  Renal/GU negative Renal ROS  negative genitourinary   Musculoskeletal  (+) Arthritis -, Osteoarthritis,    Abdominal   Peds  Hematology negative hematology ROS (+)   Anesthesia Other Findings   Reproductive/Obstetrics negative OB ROS                          Anesthesia Physical Anesthesia Plan  ASA: II  Anesthesia Plan: General and Regional   Post-op Pain Management:    Induction: Intravenous  Airway Management Planned: LMA  Additional Equipment:   Intra-op Plan:   Post-operative Plan: Extubation in OR  Informed Consent: I have reviewed the patients History and Physical, chart, labs and discussed the procedure including the risks, benefits and alternatives for the proposed anesthesia with the patient or authorized representative who has indicated his/her understanding and acceptance.   Dental advisory given  Plan Discussed with: CRNA  Anesthesia Plan Comments:         Anesthesia Quick Evaluation
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
## 2013-11-13 NOTE — Progress Notes (Signed)
CRNA at bedside to restart previously infiltrated and removed IV.

## 2013-11-13 NOTE — Transfer of Care (Signed)
Immediate Anesthesia Transfer of Care Note  Patient: Meghan FischerVictoria J Bowman  Procedure(s) Performed: Procedure(s): LEFT TOTAL KNEE ARTHROPLASTY (Left)  Patient Location: PACU  Anesthesia Type:GA combined with regional for post-op pain  Level of Consciousness: awake, alert  and oriented  Airway & Oxygen Therapy: Patient Spontanous Breathing and Patient connected to nasal cannula oxygen  Post-op Assessment: Report given to PACU RN and Post -op Vital signs reviewed and stable  Post vital signs: Reviewed and stable  Complications: No apparent anesthesia complications

## 2013-11-13 NOTE — Anesthesia Procedure Notes (Addendum)
Anesthesia Regional Block:  Femoral nerve block  Pre-Anesthetic Checklist: ,, timeout performed, Correct Patient, Correct Site, Correct Laterality, Correct Procedure, Correct Position, site marked, Risks and benefits discussed, pre-op evaluation,  At surgeon's request and post-op pain management  Laterality: Left  Prep: Maximum Sterile Barrier Precautions used and chloraprep       Needles:  Injection technique: Single-shot  Needle Type: Echogenic Stimulator Needle     Needle Length: 5cm 5 cm Needle Gauge: 22 and 22 G    Additional Needles:  Procedures: ultrasound guided (picture in chart) and nerve stimulator Femoral nerve block  Nerve Stimulator or Paresthesia:  Response: Patellar respose,   Additional Responses:   Narrative:  Start time: 11/13/2013 10:11 AM End time: 11/13/2013 10:23 AM Injection made incrementally with aspirations every 5 mL. Anesthesiologist: Fitzgerald,MD  Additional Notes: 2% Lidocaine skin wheel.    Procedure Name: LMA Insertion Date/Time: 11/13/2013 11:17 AM Performed by: Marena ChancyBECKNER, Yashica Sterbenz S Pre-anesthesia Checklist: Emergency Drugs available, Patient identified, Timeout performed, Suction available and Patient being monitored Patient Re-evaluated:Patient Re-evaluated prior to inductionOxygen Delivery Method: Circle system utilized Preoxygenation: Pre-oxygenation with 100% oxygen Intubation Type: IV induction Ventilation: Mask ventilation without difficulty LMA: LMA inserted LMA Size: 4.0 Number of attempts: 1 Placement Confirmation: breath sounds checked- equal and bilateral and positive ETCO2 Tube secured with: Tape Dental Injury: Teeth and Oropharynx as per pre-operative assessment

## 2013-11-14 ENCOUNTER — Encounter (HOSPITAL_COMMUNITY): Payer: Self-pay | Admitting: General Practice

## 2013-11-14 LAB — CBC
HCT: 29.4 % — ABNORMAL LOW (ref 36.0–46.0)
HEMOGLOBIN: 9.8 g/dL — AB (ref 12.0–15.0)
MCH: 30.6 pg (ref 26.0–34.0)
MCHC: 33.3 g/dL (ref 30.0–36.0)
MCV: 91.9 fL (ref 78.0–100.0)
Platelets: 208 10*3/uL (ref 150–400)
RBC: 3.2 MIL/uL — ABNORMAL LOW (ref 3.87–5.11)
RDW: 13 % (ref 11.5–15.5)
WBC: 8.6 10*3/uL (ref 4.0–10.5)

## 2013-11-14 LAB — BASIC METABOLIC PANEL
Anion gap: 13 (ref 5–15)
BUN: 8 mg/dL (ref 6–23)
CHLORIDE: 100 meq/L (ref 96–112)
CO2: 23 mEq/L (ref 19–32)
Calcium: 8.2 mg/dL — ABNORMAL LOW (ref 8.4–10.5)
Creatinine, Ser: 0.84 mg/dL (ref 0.50–1.10)
GFR, EST NON AFRICAN AMERICAN: 78 mL/min — AB (ref >90)
GLUCOSE: 126 mg/dL — AB (ref 70–99)
POTASSIUM: 3.4 meq/L — AB (ref 3.7–5.3)
Sodium: 136 mEq/L — ABNORMAL LOW (ref 137–147)

## 2013-11-14 NOTE — Progress Notes (Signed)
Patient ID: Meghan FischerVictoria J Bowman, female   DOB: 1960-09-07, 53 y.o.   MRN: [id number]     Subjective:  Patient reports pain as moderate to severe.  Patient states that the block is gone away and the pain is unbearable however sitting in bed in no acute distress  Denies any CP or SOB  Objective:   VITALS:   Filed Vitals:   11/14/13 0000 11/14/13 0223 11/14/13 0400 11/14/13 0527  BP:  117/74  111/76  Pulse:  94  100  Temp:  98.3 F (36.8 C)  99.4 F (37.4 C)  TempSrc:    Oral  Resp: 16 18 18 16   Height:      Weight:      SpO2: 96% 93% 94% 96%    ABD soft Sensation intact distally Dorsiflexion/Plantar flexion intact Incision: dressing C/D/I and no drainage Good foot and ankle function   Lab Results  Component Value Date   WBC 8.6 11/14/2013   HGB 9.8* 11/14/2013   HCT 29.4* 11/14/2013   MCV 91.9 11/14/2013   PLT 208 11/14/2013   BMET    Component Value Date/Time   NA 136* 11/14/2013 0358   K 3.4* 11/14/2013 0358   CL 100 11/14/2013 0358   CO2 23 11/14/2013 0358   GLUCOSE 126* 11/14/2013 0358   BUN 8 11/14/2013 0358   CREATININE 0.84 11/14/2013 0358   CALCIUM 8.2* 11/14/2013 0358   GFRNONAA 78* 11/14/2013 0358   GFRAA >90 11/14/2013 0358     Assessment/Plan: 1 Day Post-Op   Principal Problem:   Osteoarthritis of left knee Active Problems:   Knee osteoarthritis   Advance diet Up with therapy WBAT Dry dressing prn  Patient wants to go home after hospital stay but we will not rule out SNF if it is needed   Haskel KhanDOUGLAS PARRY, BRANDON 11/14/2013, 7:23 AM  Seen and agree.  Plan with family was snf.   Being cautious about medications, given addiction history.  Counseled her to expect some pain.   Teryl LucyJoshua Osher Oettinger, MD Cell [phone]

## 2013-11-14 NOTE — Progress Notes (Signed)
Occupational Therapy Evaluation Patient Details Name: Meghan FischerVictoria J Bowman MRN: [id number] DOB: Aug 12, 1960 Today's Date: 11/14/2013    History of Present Illness Meghan Bowman is a 53 y.o. Female s/p L TKA on 11/13/13.  PMH of PTSD, athritis, anxiety, and GERD.   Clinical Impression   PTA pt was independent with ADLs, however reports increasing difficulty due to knee pain. Pt mobilizing well, however is limited in her ADLs by pain and decreased ROM of LLE. Pt reports that she could stay with her daughter, however her daughter has young children to care for. Feel that safest option would be SNF at this time. Pt will benefit from acute OT to address LB ADLs and functional mobility. Pt may benefit from AE training.     Follow Up Recommendations  SNF;Supervision/Assistance - 24 hour    Equipment Recommendations  3 in 1 bedside comode    Recommendations for Other Services       Precautions / Restrictions Precautions Precautions: Knee Restrictions Weight Bearing Restrictions: Yes LLE Weight Bearing: Weight bearing as tolerated      Mobility Bed Mobility Overal bed mobility: Needs Assistance Bed Mobility: Supine to Sit     Supine to sit: Min assist     General bed mobility comments: Min (A) to assist LLE off bed. Pt demonstrated ability to perform hook maneuver with RLE to assist down to the floor.   Transfers Overall transfer level: Needs assistance Equipment used: Rolling walker (2 wheeled) Transfers: Sit to/from Stand Sit to Stand: Min guard         General transfer comment: Min Guard for safety from bed at lowest setting. Pt with good hand placement.     Balance Overall balance assessment: Needs assistance Sitting-balance support: No upper extremity supported;Feet supported Sitting balance-Leahy Scale: Good     Standing balance support: No upper extremity supported;During functional activity Standing balance-Leahy Scale: Fair Standing balance comment: pt able to  remove UEs from RW during static standing for grooming activities. Requires UE support for dynamic balance.                             ADL Overall ADL's : Needs assistance/impaired Eating/Feeding: Independent;Sitting   Grooming: Min guard;Standing   Upper Body Bathing: Set up;Sitting   Lower Body Bathing: Minimal assistance;Sit to/from stand   Upper Body Dressing : Set up;Sitting   Lower Body Dressing: Moderate assistance;Sit to/from stand   Toilet Transfer: Min guard;Ambulation;RW (sit<>stand from bed)           Functional mobility during ADLs: Min guard;Rolling walker General ADL Comments: Pt mobilizing well, however is limited by anxiety and pain.      Vision  Pt wears glasses for reading only. Reports no change from baseline.  No apparent deficits.                  Perception Perception Perception Tested?: No   Praxis Praxis Praxis tested?: Within functional limits    Pertinent Vitals/Pain Pain Assessment: 0-10 Pain Score: 4  Pain Location: Lt knee Pain Descriptors / Indicators: Aching;Constant Pain Intervention(s): Limited activity within patient's tolerance;Monitored during session;Repositioned     Hand Dominance Right   Extremity/Trunk Assessment Upper Extremity Assessment Upper Extremity Assessment: Overall WFL for tasks assessed   Lower Extremity Assessment Lower Extremity Assessment: Defer to PT evaluation   Cervical / Trunk Assessment Cervical / Trunk Assessment: Normal   Communication Communication Communication: No difficulties   Cognition Arousal/Alertness: Awake/alert  Behavior During Therapy: WFL for tasks assessed/performed Overall Cognitive Status: Within Functional Limits for tasks assessed                                Home Living Family/patient expects to be discharged to:: Skilled nursing facility Living Arrangements: Non-relatives/Friends                               Additional  Comments: Pt has been living in a Group Home, however she would like to go to SNF at d/c. Other option is to go to her daughter's house.      Prior Functioning/Environment Level of Independence: Independent        Comments: pt reports increasing difficulty with ADLs and mobility due to knee pain.     OT Diagnosis: Generalized weakness;Acute pain   OT Problem List: Decreased strength;Decreased range of motion;Decreased activity tolerance;Impaired balance (sitting and/or standing);Decreased safety awareness;Decreased knowledge of use of DME or AE;Decreased knowledge of precautions;Pain   OT Treatment/Interventions: Self-care/ADL training;Therapeutic exercise;Energy conservation;DME and/or AE instruction;Patient/family education;Balance training;Therapeutic activities    OT Goals(Current goals can be found in the care plan section) Acute Rehab OT Goals Patient Stated Goal: to do what I need to do to get back to life OT Goal Formulation: With patient Time For Goal Achievement: 11/28/13 Potential to Achieve Goals: Good ADL Goals Pt Will Perform Grooming: with supervision;standing Pt Will Perform Lower Body Bathing: with supervision;with set-up;with adaptive equipment;sit to/from stand Pt Will Perform Lower Body Dressing: with set-up;with supervision;with adaptive equipment;sit to/from stand Pt Will Transfer to Toilet: with supervision;ambulating;bedside commode Pt Will Perform Toileting - Clothing Manipulation and hygiene: with supervision;sit to/from stand  OT Frequency: Min 2X/week   Barriers to D/C: Decreased caregiver support  Pt reports she could stay with her daughter, however her daughter has young children and will need to care for them          End of Session Equipment Utilized During Treatment: Gait belt;Rolling walker Nurse Communication: Mobility status  Activity Tolerance: Patient tolerated treatment well Patient left: Other (comment) (in hallway with PT for  ambulation)   Time: [id number] OT Time Calculation (min): 22 min Charges:  OT General Charges $OT Visit: 1 Procedure OT Evaluation $Initial OT Evaluation Tier I: 1 Procedure OT Treatments $Self Care/Home Management : 8-22 mins  Meghan Bowman 11/14/2013, 11:42 AM  Carney LivingLeeAnn Marie Andoni Busch, OTR/L Occupational Therapist [phone] (pager)

## 2013-11-14 NOTE — Evaluation (Signed)
Physical Therapy Evaluation Patient Details Name: Meghan Bowman MRN: [id number] DOB: 1960/03/24 Today's Date: 11/14/2013   History of Present Illness  Meghan Bowman is a 53 y.o. Female s/p L TKA on 11/13/13.  PMH of PTSD, athritis, anxiety, and GERD.  Clinical Impression  Pt is s/p TKA resulting in the deficits listed below (see PT Problem List). Min assist needed for bed mobility and min guard assist for transfer and gait with RW 120 feet.  Pt will benefit from skilled PT to increase their independence and safety with mobility to allow discharge to the venue listed below. Pt resided in a group home PTA.  Recommend ST SNF at d/c or pt reports staying with her daughter may be an option.     Follow Up Recommendations SNF;Supervision for mobility/OOB    Equipment Recommendations  Rolling walker with 5" wheels    Recommendations for Other Services       Precautions / Restrictions Precautions Precautions: Knee Required Braces or Orthoses: Knee Immobilizer - Left Knee Immobilizer - Left: On when out of bed or walking Restrictions Weight Bearing Restrictions: Yes LLE Weight Bearing: Weight bearing as tolerated      Mobility  Bed Mobility Overal bed mobility: Needs Assistance Bed Mobility: Sit to Supine     Supine to sit: Min assist     General bed mobility comments: assist with LLE  Transfers Overall transfer level: Needs assistance Equipment used: Rolling walker (2 wheeled) Transfers: Sit to/from Stand Sit to Stand: Min guard         General transfer comment: verbal cues for safety and sequencing  Ambulation/Gait Ambulation/Gait assistance: Min guard Ambulation Distance (Feet): 120 Feet Assistive device: Rolling walker (2 wheeled) Gait Pattern/deviations: Step-to pattern;Decreased stride length Gait velocity: decreased      Stairs            Wheelchair Mobility    Modified Rankin (Stroke Patients Only)       Balance Overall balance  assessment: Needs assistance Sitting-balance support: No upper extremity supported;Feet supported Sitting balance-Leahy Scale: Good     Standing balance support: No upper extremity supported;During functional activity Standing balance-Leahy Scale: Fair Standing balance comment: pt able to remove UEs from RW during static standing for grooming activities. Requires UE support for dynamic balance.                              Pertinent Vitals/Pain Pain Assessment: 0-10 Pain Score: 4  Pain Location: L knee Pain Descriptors / Indicators: Aching;Constant Pain Intervention(s): Limited activity within patient's tolerance;Monitored during session    Home Living Family/patient expects to be discharged to:: Skilled nursing facility Living Arrangements: Non-relatives/Friends               Additional Comments: Pt has been living in a Group Home, however she would like to go to SNF at d/c. Other option is to go to her daughter's house.    Prior Function Level of Independence: Independent         Comments: pt reports increasing difficulty with ADLs and mobility due to knee pain.      Hand Dominance   Dominant Hand: Right    Extremity/Trunk Assessment   Upper Extremity Assessment: Overall WFL for tasks assessed           Lower Extremity Assessment: Defer to PT evaluation      Cervical / Trunk Assessment: Normal  Communication   Communication: No difficulties  Cognition Arousal/Alertness: Awake/alert Behavior During Therapy: WFL for tasks assessed/performed Overall Cognitive Status: Within Functional Limits for tasks assessed                      General Comments      Exercises Total Joint Exercises Ankle Circles/Pumps: AROM;Both;10 reps;Supine Quad Sets: AROM;Left;10 reps;Supine Heel Slides: AAROM;Left;10 reps;Supine Hip ABduction/ADduction: AAROM;Left;10 reps;Supine Goniometric ROM: 5-60 degrees L knee AAROM in supine       Assessment/Plan    PT Assessment Patient needs continued PT services  PT Diagnosis Difficulty walking;Acute pain   PT Problem List Decreased strength;Decreased range of motion;Decreased activity tolerance;Decreased balance;Decreased mobility;Decreased knowledge of precautions;Pain;Decreased knowledge of use of DME;Decreased safety awareness  PT Treatment Interventions DME instruction;Gait training;Stair training;Functional mobility training;Therapeutic activities;Therapeutic exercise;Patient/family education;Balance training   PT Goals (Current goals can be found in the Care Plan section) Acute Rehab PT Goals Patient Stated Goal: to do what I need to do to get back to life PT Goal Formulation: With patient Time For Goal Achievement: 11/14/13 Potential to Achieve Goals: Good    Frequency 7X/week   Barriers to discharge Decreased caregiver support      Co-evaluation               End of Session Equipment Utilized During Treatment: Gait belt Activity Tolerance: Patient tolerated treatment well Patient left: in bed;with call bell/phone within reach Nurse Communication: Mobility status         Time: 1120-1140 PT Time Calculation (min): 20 min   Charges:   PT Evaluation $Initial PT Evaluation Tier I: 1 Procedure PT Treatments $Gait Training: 8-22 mins   PT G Codes:          Ilda FoilGarrow, Chester Romero Rene 11/14/2013, 12:20 PM

## 2013-11-14 NOTE — Progress Notes (Signed)
Utilization review completed.  

## 2013-11-14 NOTE — Clinical Social Work Note (Signed)
CSW received consult for possible SNF placement at time of discharge. CSW noted pt's insurance as [place], which does not provide SNF benefits. CSW consulted with CSW Surveyor, quantity regarding possible LOG (Letter of Guarantee); pt is not eligible for LOG SNF placement.  CSW met with pt at bedside to complete assessment. 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 Pt stated understanding of pt not being eligible for SNF placement. Pt informed CSW pt will discharge home with pt's daughter, [name], at time of discharge. Pt stated she has re-arranged pt's bedroom at home in order to accommodate pt's physical needs at time of discharge. CSW has updated RNCM regarding information above. CSW signing off. Thank you for the referral.  Henderson [place] [phone]) Licensed Clinical Social Worker Orthopedics [phone]) and Surgical [phone])

## 2013-11-15 LAB — CBC
HEMATOCRIT: 28.6 % — AB (ref 36.0–46.0)
HEMOGLOBIN: 9.5 g/dL — AB (ref 12.0–15.0)
MCH: 30 pg (ref 26.0–34.0)
MCHC: 33.2 g/dL (ref 30.0–36.0)
MCV: 90.2 fL (ref 78.0–100.0)
Platelets: 195 10*3/uL (ref 150–400)
RBC: 3.17 MIL/uL — AB (ref 3.87–5.11)
RDW: 12.8 % (ref 11.5–15.5)
WBC: 11.7 10*3/uL — AB (ref 4.0–10.5)

## 2013-11-15 NOTE — Discharge Instructions (Signed)
Diet: As you were doing prior to hospitalization  ° °Shower:  May shower but keep the wounds dry, use an occlusive plastic wrap, NO SOAKING IN TUB.  If the bandage gets wet, change with a clean dry gauze. ° °Dressing:  You may change your dressing 3-5 days after surgery.  Then change the dressing daily with sterile gauze dressing.   ° °There are sticky tapes (steri-strips) on your wounds and all the stitches are absorbable.  Leave the steri-strips in place when changing your dressings, they will peel off with time, usually 2-3 weeks. ° °Activity:  Increase activity slowly as tolerated, but follow the weight bearing instructions below.  No lifting or driving for 6 weeks. ° °Weight Bearing:   As tolerated.   ° °To prevent constipation: you may use a stool softener such as - ° °Colace (over the counter) 100 mg by mouth twice a day  °Drink plenty of fluids (prune juice may be helpful) and high fiber foods °Miralax (over the counter) for constipation as needed.   ° °Itching:  If you experience itching with your medications, try taking only a single pain pill, or even half a pain pill at a time.  You may take up to 10 pain pills per day, and you can also use benadryl over the counter for itching or also to help with sleep.  ° °Precautions:  If you experience chest pain or shortness of breath - call 911 immediately for transfer to the hospital emergency department!! ° °If you develop a fever greater that 101 F, purulent drainage from wound, increased redness or drainage from wound, or calf pain -- Call the office at [phone]                                                °Follow- Up Appointment:  Please call for an appointment to be seen in 2 weeks [place] - [phone] ° °Information on my medicine - XARELTO® (Rivaroxaban) ° °This medication education was reviewed with me or my healthcare representative as part of my discharge preparation.  The pharmacist that spoke with me during my hospital stay was:  Craigory Toste,  Riggins Cisek Prescott, RPH ° °Why was Xarelto® prescribed for you? °Xarelto® was prescribed for you to reduce the risk of blood clots forming after orthopedic surgery. The medical term for these abnormal blood clots is venous thromboembolism (VTE). ° °What do you need to know about xarelto® ? °Take your Xarelto® ONCE DAILY at the same time every day. °You may take it either with or without food. ° °If you have difficulty swallowing the tablet whole, you may crush it and mix in applesauce just prior to taking your dose. ° °Take Xarelto® exactly as prescribed by your doctor and DO NOT stop taking Xarelto® without talking to the doctor who prescribed the medication.  Stopping without other VTE prevention medication to take the place of Xarelto® may increase your risk of developing a clot. ° °After discharge, you should have regular check-up appointments with your healthcare provider that is prescribing your Xarelto®.   ° °What do you do if you miss a dose? °If you miss a dose, take it as soon as you remember on the same day then continue your regularly scheduled once daily regimen the next day. Do not take two doses of Xarelto® on the same day.  ° °Important Safety   Information °A possible side effect of Xarelto® is bleeding. You should call your healthcare provider right away if you experience any of the following: °  Bleeding from an injury or your nose that does not stop. °  Unusual colored urine (red or dark brown) or unusual colored stools (red or black). °  Unusual bruising for unknown reasons. °  A serious fall or if you hit your head (even if there is no bleeding). ° °Some medicines may interact with Xarelto® and might increase your risk of bleeding while on Xarelto®. To help avoid this, consult your healthcare provider or pharmacist prior to using any new prescription or non-prescription medications, including herbals, vitamins, non-steroidal anti-inflammatory drugs (NSAIDs) and supplements. ° °This website has more  information on Xarelto®: www.xarelto.com. ° ° ° ° ° ° °

## 2013-11-15 NOTE — Progress Notes (Signed)
Occupational Therapy Treatment and Discharge Patient Details Name: Meghan Bowman MRN: [id number] DOB: 1960/06/23 Today's Date: 11/15/2013    History of present illness Meghan Bowman is a 53 y.o. Female s/p L TKA on 11/13/13.  PMH of PTSD, athritis, anxiety, and GERD.   OT comments  This 53 yo admitted with above presents to acute OT with all goals met, acute OT will sign off.  Follow Up Recommendations  Home health OT;Supervision - Intermittent    Equipment Recommendations  3 in 1 bedside comode       Precautions / Restrictions Precautions Precautions: Knee Required Braces or Orthoses: Knee Immobilizer - Left (removed this session due to pt can straight leg raise and reports she has not been wearing it when she is up and about (except with PT earlier)) Knee Immobilizer - Left: On when out of bed or walking Restrictions Weight Bearing Restrictions: Yes LLE Weight Bearing: Weight bearing as tolerated       Mobility Bed Mobility Overal bed mobility: Modified Independent Bed Mobility: Supine to Sit;Sit to Supine     Supine to sit: Modified independent (Device/Increase time) Sit to supine: Modified independent (Device/Increase time)      Transfers Overall transfer level: Modified independent Equipment used: Rolling walker (2 wheeled) Transfers: Sit to/from Stand Sit to Stand: Modified independent (Device/Increase time)               ADL Overall ADL's : Modified independent                                       General ADL Comments: at RW level; even going around her room and gathering items to pack up with her RW                Cognition   Behavior During Therapy: [facility] for tasks assessed/performed Overall Cognitive Status: Within Functional Limits for tasks assessed                                    Pertinent Vitals/ Pain       Pain Assessment: No/denies pain Pain Score: 0-No pain Pain Location: L knee Pain  Intervention(s): Monitored during session;Premedicated before session         Frequency Min 2X/week     Progress Toward Goals  OT Goals(current goals can now be found in the care plan section)  Progress towards OT goals: Goals met/education completed, patient discharged from [facility] Discharge plan needs to be updated       End of Session Equipment Utilized During Treatment: Gait belt;Rolling walker   Activity Tolerance Patient tolerated treatment well   Patient Left in bed;with call bell/phone within reach   Nurse Communication  (Pt ready to go from a therapy standpoint)        Time: 9323-5573 OT Time Calculation (min): 18 min  Charges: OT General Charges $OT Visit: 1 Procedure OT Treatments $Self Care/Home Management : 8-22 mins  Almon Register [phone] 11/15/2013, 10:14 AM

## 2013-11-15 NOTE — Plan of Care (Signed)
Problem: Phase I Progression Outcomes Goal: Pain controlled with appropriate interventions Outcome: Not Progressing Patient is still requiring IV Dilaudid in between doses of PO oxycodone

## 2013-11-15 NOTE — Progress Notes (Signed)
Physical Therapy Treatment Patient Details Name: Meghan FischerVictoria J Bowman MRN: [id number] DOB: 1960/03/19 Today's Date: 11/15/2013    History of Present Illness      PT Comments    TKA exercise handouts provided.  Plan is for d/c home today with daughter and HHPT.  Pt will need RW and 3n1 for home.  Follow Up Recommendations  Home health PT;Supervision for mobility/OOB     Equipment Recommendations  Rolling walker with 5" wheels;3in1 (PT)    Recommendations for Other Services       Precautions / Restrictions Precautions Precautions: Knee Required Braces or Orthoses: Knee Immobilizer - Left Knee Immobilizer - Left: On when out of bed or walking Restrictions Weight Bearing Restrictions: Yes LLE Weight Bearing: Weight bearing as tolerated    Mobility  Bed Mobility         Supine to sit: Supervision Sit to supine: Supervision      Transfers   Equipment used: Rolling walker (2 wheeled) Transfers: Stand Pivot Transfers Sit to Stand: Supervision Stand pivot transfers: Supervision       General transfer comment: verbal cues for sequencing and safety  Ambulation/Gait Ambulation/Gait assistance: Supervision Ambulation Distance (Feet): 200 Feet Assistive device: Rolling walker (2 wheeled) Gait Pattern/deviations: Step-through pattern;Decreased stride length Gait velocity: decreased       Stairs Stairs: Yes Stairs assistance: Min guard Stair Management: Two rails;Forwards Number of Stairs: 5    Wheelchair Mobility    Modified Rankin (Stroke Patients Only)       Balance                                    Cognition Arousal/Alertness: Awake/alert Behavior During Therapy: WFL for tasks assessed/performed Overall Cognitive Status: Within Functional Limits for tasks assessed                      Exercises      General Comments        Pertinent Vitals/Pain Pain Assessment: 0-10 Pain Score: 3  Pain Location: L knee Pain  Intervention(s): Monitored during session;Premedicated before session    Home Living                      Prior Function            PT Goals (current goals can now be found in the care plan section) Progress towards PT goals: Progressing toward goals    Frequency  7X/week    PT Plan Discharge plan needs to be updated    Co-evaluation             End of Session Equipment Utilized During Treatment: Gait belt;Left knee immobilizer Activity Tolerance: Patient tolerated treatment well Patient left: in bed;with call bell/phone within reach     Time: 0901-0925 PT Time Calculation (min): 24 min  Charges:  $Gait Training: 23-37 mins                    G Codes:      Meghan Bowman, Meghan Bowman 11/15/2013, 10:09 AM
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
## 2013-11-16 NOTE — Addendum Note (Signed)
Addendum created 11/16/13 1040 by Rosezella FloridaWilliam E Jevon Shells, MD   Modules edited: Anesthesia Attestations

## 2013-11-17 NOTE — Care Management Note (Signed)
CARE MANAGEMENT NOTE 11/17/2013  Patient:  Albin FischerFOY,Ayianna J   Account Number:  [id number]  Date Initiated:  11/16/2013  Documentation initiated by:  Vance PeperBRADY,Chino Sardo  Subjective/Objective Assessment:   53 yr old female admitted withleft knee osteoarthritis, patient had a left total knee arthroplasty.     Action/Plan:   Case manager spoke with patient concerning home health and DME needs. Choice was offered. [facility]. Patient has daughter for support.   Anticipated DC Date:  11/15/2013   Anticipated DC Plan:  HOME W HOME HEALTH SERVICES      DC Planning Services  CM consult      PAC Choice  DURABLE MEDICAL EQUIPMENT  HOME HEALTH   Choice offered to / List presented to:  C-1 Patient   DME arranged  [facility]  3-N-1      DME agency  [employer]     HH arranged  HH-2 PT      HH agency  [facility]   Status of service:  Completed, signed off Medicare Important Message given?   (If response is "NO", the following Medicare IM given date fields will be blank) Date Medicare IM given:   Medicare IM given by:   Date Additional Medicare IM given:   Additional Medicare IM given by:    Discharge Disposition:  HOME W HOME HEALTH SERVICES  Per UR Regulation:  Reviewed for med. necessity/level of care/duration of stay  If discussed at Long Length of Stay Meetings, dates discussed:    Comments:

## 2013-12-14 ENCOUNTER — Ambulatory Visit: Payer: Medicaid Other | Admitting: Physical Therapy

## 2014-01-08 ENCOUNTER — Emergency Department (HOSPITAL_COMMUNITY): Payer: Medicaid Other

## 2014-01-08 ENCOUNTER — Encounter (HOSPITAL_COMMUNITY): Payer: Self-pay | Admitting: Emergency Medicine

## 2014-01-08 ENCOUNTER — Emergency Department (HOSPITAL_COMMUNITY)
Admission: EM | Admit: 2014-01-08 | Discharge: 2014-01-08 | Disposition: A | Discharge status: Home / Self Care | Payer: Medicaid Other | Attending: Emergency Medicine | Admitting: Emergency Medicine

## 2014-01-08 DIAGNOSIS — Y998 Other external cause status: Secondary | ICD-10-CM | POA: Insufficient documentation

## 2014-01-08 DIAGNOSIS — Z89522 Acquired absence of left knee: Secondary | ICD-10-CM | POA: Diagnosis not present

## 2014-01-08 DIAGNOSIS — S8002XA Contusion of left knee, initial encounter: Secondary | ICD-10-CM | POA: Diagnosis not present

## 2014-01-08 DIAGNOSIS — S8012XA Contusion of left lower leg, initial encounter: Secondary | ICD-10-CM

## 2014-01-08 DIAGNOSIS — G4730 Sleep apnea, unspecified: Secondary | ICD-10-CM | POA: Diagnosis not present

## 2014-01-08 DIAGNOSIS — Z720 Tobacco use: Secondary | ICD-10-CM | POA: Insufficient documentation

## 2014-01-08 DIAGNOSIS — K219 Gastro-esophageal reflux disease without esophagitis: Secondary | ICD-10-CM | POA: Diagnosis not present

## 2014-01-08 DIAGNOSIS — Y9289 Other specified places as the place of occurrence of the external cause: Secondary | ICD-10-CM | POA: Diagnosis not present

## 2014-01-08 DIAGNOSIS — Y9389 Activity, other specified: Secondary | ICD-10-CM | POA: Diagnosis not present

## 2014-01-08 DIAGNOSIS — J45909 Unspecified asthma, uncomplicated: Secondary | ICD-10-CM | POA: Diagnosis not present

## 2014-01-08 DIAGNOSIS — Z7951 Long term (current) use of inhaled steroids: Secondary | ICD-10-CM | POA: Diagnosis not present

## 2014-01-08 DIAGNOSIS — Z79899 Other long term (current) drug therapy: Secondary | ICD-10-CM | POA: Diagnosis not present

## 2014-01-08 DIAGNOSIS — Z7901 Long term (current) use of anticoagulants: Secondary | ICD-10-CM | POA: Diagnosis not present

## 2014-01-08 DIAGNOSIS — G43909 Migraine, unspecified, not intractable, without status migrainosus: Secondary | ICD-10-CM | POA: Diagnosis not present

## 2014-01-08 DIAGNOSIS — F419 Anxiety disorder, unspecified: Secondary | ICD-10-CM | POA: Diagnosis not present

## 2014-01-08 DIAGNOSIS — W228XXA Striking against or struck by other objects, initial encounter: Secondary | ICD-10-CM | POA: Diagnosis not present

## 2014-01-08 DIAGNOSIS — M79605 Pain in left leg: Secondary | ICD-10-CM

## 2014-01-08 DIAGNOSIS — Z8739 Personal history of other diseases of the musculoskeletal system and connective tissue: Secondary | ICD-10-CM | POA: Insufficient documentation

## 2014-01-08 DIAGNOSIS — F329 Major depressive disorder, single episode, unspecified: Secondary | ICD-10-CM | POA: Diagnosis not present

## 2014-01-08 DIAGNOSIS — S8992XA Unspecified injury of left lower leg, initial encounter: Secondary | ICD-10-CM | POA: Diagnosis present

## 2014-01-08 LAB — CBC
HEMATOCRIT: 39.1 % (ref 36.0–46.0)
HEMOGLOBIN: 12.5 g/dL (ref 12.0–15.0)
MCH: 28.9 pg (ref 26.0–34.0)
MCHC: 32 g/dL (ref 30.0–36.0)
MCV: 90.5 fL (ref 78.0–100.0)
Platelets: 281 10*3/uL (ref 150–400)
RBC: 4.32 MIL/uL (ref 3.87–5.11)
RDW: 12.6 % (ref 11.5–15.5)
WBC: 10 10*3/uL (ref 4.0–10.5)

## 2014-01-08 MED ORDER — OXYCODONE-ACETAMINOPHEN 5-325 MG PO TABS: 1.0000 | ORAL_TABLET | ORAL | Status: DC | PRN

## 2014-01-08 MED ORDER — OXYCODONE-ACETAMINOPHEN 5-325 MG PO TABS
1.0000 | ORAL_TABLET | Freq: Once | ORAL | Status: AC
Start: 2014-01-08 — End: 2014-01-08
  Administered 2014-01-08: 1 via ORAL
  Filled 2014-01-08: qty 1

## 2014-01-08 NOTE — ED Notes (Signed)
Patient injured left knee one week ago. Had surgery 8 weeks ago on left knee-had total knee replacement.  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 Has been taking narcotic pain medications with no alleviation of pain. Patient c/o 10/10 pain. Slight swelling noted to left knee. Nauseous d/t pain. Has had 1.5 pills today. Able to move left leg. No other complaints/concerns.
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
## 2014-01-08 NOTE — Discharge Instructions (Signed)
Contusion °A contusion is a deep bruise. Contusions are the result of an injury that caused bleeding under the skin. The contusion may turn blue, purple, or yellow. Minor injuries will give you a painless contusion, but more severe contusions may stay painful and swollen for a few weeks.  °CAUSES  °A contusion is usually caused by a blow, trauma, or direct force to an area of the body. °SYMPTOMS  °· Swelling and redness of the injured area. °· Bruising of the injured area. °· Tenderness and soreness of the injured area. °· Pain. °DIAGNOSIS  °The diagnosis can be made by taking a history and physical exam. An X-ray, CT scan, or MRI may be needed to determine if there were any associated injuries, such as fractures. °TREATMENT  °Specific treatment will depend on what area of the body was injured. In general, the best treatment for a contusion is resting, icing, elevating, and applying cold compresses to the injured area. Over-the-counter medicines may also be recommended for pain control. Ask your caregiver what the best treatment is for your contusion. °HOME CARE INSTRUCTIONS  °· Put ice on the injured area. °· Put ice in a plastic bag. °· Place a towel between your skin and the bag. °· Leave the ice on for 15-20 minutes, 3-4 times a day, or as directed by your health care provider. °· Only take over-the-counter or prescription medicines for pain, discomfort, or fever as directed by your caregiver. Your caregiver may recommend avoiding anti-inflammatory medicines (aspirin, ibuprofen, and naproxen) for 48 hours because these medicines may increase bruising. °· Rest the injured area. °· If possible, elevate the injured area to reduce swelling. °SEEK IMMEDIATE MEDICAL CARE IF:  °· You have increased bruising or swelling. °· You have pain that is getting worse. °· Your swelling or pain is not relieved with medicines. °MAKE SURE YOU:  °· Understand these instructions. °· Will watch your condition. °· Will get help right  away if you are not doing well or get worse. °Document Released: 10/21/2004 Document Revised: 01/16/2013 Document Reviewed: 11/16/2010 °ExitCare® Patient Information ©2015 [employer]. This information is not intended to replace advice given to you by your health care provider. Make sure you discuss any questions you have with your health care provider. ° °Cryotherapy °Cryotherapy means treatment with cold. Ice or gel packs can be used to reduce both pain and swelling. Ice is the most helpful within the first 24 to 48 hours after an injury or flare-up from overusing a muscle or joint. Sprains, strains, spasms, burning pain, shooting pain, and aches can all be eased with ice. Ice can also be used when recovering from surgery. Ice is effective, has very few side effects, and is safe for most people to use. °PRECAUTIONS  °Ice is not a safe treatment option for people with: °· Raynaud phenomenon. This is a condition affecting small blood vessels in the extremities. Exposure to cold may cause your problems to return. °· Cold hypersensitivity. There are many forms of cold hypersensitivity, including: °¨ Cold urticaria. Red, itchy hives appear on the skin when the tissues begin to warm after being iced. °¨ Cold erythema. This is a red, itchy rash caused by exposure to cold. °¨ Cold hemoglobinuria. Red blood cells break down when the tissues begin to warm after being iced. The hemoglobin that carry oxygen are passed into the urine because they cannot combine with blood proteins fast enough. °· Numbness or altered sensitivity in the area being iced. °If you have any   of the following conditions, do not use ice until you have discussed cryotherapy with your caregiver: °· Heart conditions, such as arrhythmia, angina, or chronic heart disease. °· High blood pressure. °· Healing wounds or open skin in the area being iced. °· Current infections. °· Rheumatoid arthritis. °· Poor circulation. °· Diabetes. °Ice slows the blood flow  in the region it is applied. This is beneficial when trying to stop inflamed tissues from spreading irritating chemicals to surrounding tissues. However, if you expose your skin to cold temperatures for too long or without the proper protection, you can damage your skin or nerves. Watch for signs of skin damage due to cold. °HOME CARE INSTRUCTIONS °Follow these tips to use ice and cold packs safely. °· Place a dry or damp towel between the ice and skin. A damp towel will cool the skin more quickly, so you may need to shorten the time that the ice is used. °· For a more rapid response, add gentle compression to the ice. °· Ice for no more than 10 to 20 minutes at a time. The bonier the area you are icing, the less time it will take to get the benefits of ice. °· Check your skin after 5 minutes to make sure there are no signs of a poor response to cold or skin damage. °· Rest 20 minutes or more between uses. °· Once your skin is numb, you can end your treatment. You can test numbness by very lightly touching your skin. The touch should be so light that you do not see the skin dimple from the pressure of your fingertip. When using ice, most people will feel these normal sensations in this order: cold, burning, aching, and numbness. °· Do not use ice on someone who cannot communicate their responses to pain, such as small children or people with dementia. °HOW TO MAKE AN ICE PACK °Ice packs are the most common way to use ice therapy. Other methods include ice massage, ice baths, and cryosprays. Muscle creams that cause a cold, tingly feeling do not offer the same benefits that ice offers and should not be used as a substitute unless recommended by your caregiver. °To make an ice pack, do one of the following: °· Place crushed ice or a bag of frozen vegetables in a sealable plastic bag. Squeeze out the excess air. Place this bag inside another plastic bag. Slide the bag into a pillowcase or place a damp towel between  your skin and the bag. °· Mix 3 parts water with 1 part rubbing alcohol. Freeze the mixture in a sealable plastic bag. When you remove the mixture from the freezer, it will be slushy. Squeeze out the excess air. Place this bag inside another plastic bag. Slide the bag into a pillowcase or place a damp towel between your skin and the bag. °SEEK MEDICAL CARE IF: °· You develop white spots on your skin. This may give the skin a blotchy (mottled) appearance. °· Your skin turns blue or pale. °· Your skin becomes waxy or hard. °· Your swelling gets worse. °MAKE SURE YOU:  °· Understand these instructions. °· Will watch your condition. °· Will get help right away if you are not doing well or get worse. °Document Released: 09/07/2010 Document Revised: 05/28/2013 Document Reviewed: 09/07/2010 °ExitCare® Patient Information ©2015 [employer]. This information is not intended to replace advice given to you by your health care provider. Make sure you discuss any questions you have with your health care provider. ° °

## 2014-01-19 ENCOUNTER — Emergency Department (HOSPITAL_COMMUNITY)
Admission: EM | Admit: 2014-01-19 | Discharge: 2014-01-19 | Disposition: A | Discharge status: Home / Self Care | Payer: Medicaid Other | Attending: Emergency Medicine | Admitting: Emergency Medicine

## 2014-01-19 ENCOUNTER — Emergency Department (HOSPITAL_COMMUNITY): Payer: Medicaid Other

## 2014-01-19 DIAGNOSIS — K219 Gastro-esophageal reflux disease without esophagitis: Secondary | ICD-10-CM | POA: Insufficient documentation

## 2014-01-19 DIAGNOSIS — Z7901 Long term (current) use of anticoagulants: Secondary | ICD-10-CM | POA: Diagnosis not present

## 2014-01-19 DIAGNOSIS — R110 Nausea: Secondary | ICD-10-CM | POA: Diagnosis not present

## 2014-01-19 DIAGNOSIS — M1712 Unilateral primary osteoarthritis, left knee: Secondary | ICD-10-CM | POA: Diagnosis not present

## 2014-01-19 DIAGNOSIS — R079 Chest pain, unspecified: Secondary | ICD-10-CM

## 2014-01-19 DIAGNOSIS — Z87828 Personal history of other (healed) physical injury and trauma: Secondary | ICD-10-CM | POA: Insufficient documentation

## 2014-01-19 DIAGNOSIS — Z79899 Other long term (current) drug therapy: Secondary | ICD-10-CM | POA: Insufficient documentation

## 2014-01-19 DIAGNOSIS — Z720 Tobacco use: Secondary | ICD-10-CM | POA: Diagnosis not present

## 2014-01-19 DIAGNOSIS — J45901 Unspecified asthma with (acute) exacerbation: Secondary | ICD-10-CM | POA: Diagnosis not present

## 2014-01-19 DIAGNOSIS — Z8679 Personal history of other diseases of the circulatory system: Secondary | ICD-10-CM | POA: Diagnosis not present

## 2014-01-19 DIAGNOSIS — R42 Dizziness and giddiness: Secondary | ICD-10-CM | POA: Insufficient documentation

## 2014-01-19 DIAGNOSIS — R072 Precordial pain: Secondary | ICD-10-CM | POA: Diagnosis not present

## 2014-01-19 LAB — BRAIN NATRIURETIC PEPTIDE: B NATRIURETIC PEPTIDE 5: 25.3 pg/mL (ref 0.0–100.0)

## 2014-01-19 LAB — BASIC METABOLIC PANEL
Anion gap: 9 (ref 5–15)
BUN: 9 mg/dL (ref 6–23)
CO2: 23 mmol/L (ref 19–32)
CREATININE: 0.74 mg/dL (ref 0.50–1.10)
Calcium: 9.4 mg/dL (ref 8.4–10.5)
Chloride: 108 mEq/L (ref 96–112)
GFR calc Af Amer: >90 mL/min (ref >90)
GFR calc non Af Amer: >90 mL/min (ref >90)
Glucose, Bld: 99 mg/dL (ref 70–99)
Potassium: 3.1 mmol/L — ABNORMAL LOW (ref 3.5–5.1)
Sodium: 140 mmol/L (ref 135–145)

## 2014-01-19 LAB — CBC
HCT: 39.5 % (ref 36.0–46.0)
Hemoglobin: 12.8 g/dL (ref 12.0–15.0)
MCH: 28.9 pg (ref 26.0–34.0)
MCHC: 32.4 g/dL (ref 30.0–36.0)
MCV: 89.2 fL (ref 78.0–100.0)
PLATELETS: 282 10*3/uL (ref 150–400)
RBC: 4.43 MIL/uL (ref 3.87–5.11)
RDW: 12.8 % (ref 11.5–15.5)
WBC: 7.1 10*3/uL (ref 4.0–10.5)

## 2014-01-19 MED ORDER — IOHEXOL 350 MG/ML SOLN
100.0000 mL | Freq: Once | INTRAVENOUS | Status: AC | PRN
  Administered 2014-01-19: 100 mL via INTRAVENOUS

## 2014-01-19 MED ORDER — ACETAMINOPHEN 500 MG PO TABS
1000.0000 mg | ORAL_TABLET | Freq: Once | ORAL | Status: DC
  Filled 2014-01-19: qty 2

## 2014-01-19 MED ORDER — LORAZEPAM 1 MG PO TABS
1.0000 mg | ORAL_TABLET | Freq: Once | ORAL | Status: AC
  Administered 2014-01-19: 1 mg via ORAL
  Filled 2014-01-19: qty 1

## 2014-01-19 NOTE — Discharge Instructions (Signed)
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
## 2014-01-19 NOTE — ED Notes (Signed)
Pt refused tylenol, sts "I will not take tylenol without narcotic".

## 2014-01-19 NOTE — ED Notes (Signed)
Pt c/o chest pain, shob over the past few days. That got worse when she was out in her yard picking up sticks.  Pt states that in center that radiates to  Back. Pt has total left knee replacement several weeks ago and is currently being weaned done on her pain medications. Pt states that her sister did some research and she believes its side effects of the hydrocodone-acetaminophen 5-325mg  that she started on Monday, which is same time frame as the symptoms.

## 2014-01-21 LAB — I-STAT TROPONIN, ED: Troponin i, poc: 0 ng/mL (ref 0.00–0.08)

## 2014-02-18 ENCOUNTER — Emergency Department (HOSPITAL_COMMUNITY): Payer: No Typology Code available for payment source

## 2014-02-18 ENCOUNTER — Emergency Department (HOSPITAL_COMMUNITY)
Admission: EM | Admit: 2014-02-18 | Discharge: 2014-02-18 | Disposition: A | Discharge status: Home / Self Care | Payer: No Typology Code available for payment source | Attending: Emergency Medicine | Admitting: Emergency Medicine

## 2014-02-18 ENCOUNTER — Encounter (HOSPITAL_COMMUNITY): Payer: Self-pay | Admitting: Emergency Medicine

## 2014-02-18 DIAGNOSIS — M179 Osteoarthritis of knee, unspecified: Secondary | ICD-10-CM | POA: Diagnosis not present

## 2014-02-18 DIAGNOSIS — F419 Anxiety disorder, unspecified: Secondary | ICD-10-CM | POA: Diagnosis not present

## 2014-02-18 DIAGNOSIS — R51 Headache: Secondary | ICD-10-CM | POA: Diagnosis not present

## 2014-02-18 DIAGNOSIS — R55 Syncope and collapse: Secondary | ICD-10-CM | POA: Diagnosis not present

## 2014-02-18 DIAGNOSIS — R110 Nausea: Secondary | ICD-10-CM | POA: Insufficient documentation

## 2014-02-18 DIAGNOSIS — E876 Hypokalemia: Secondary | ICD-10-CM | POA: Diagnosis not present

## 2014-02-18 DIAGNOSIS — H539 Unspecified visual disturbance: Secondary | ICD-10-CM | POA: Insufficient documentation

## 2014-02-18 DIAGNOSIS — R519 Headache, unspecified: Secondary | ICD-10-CM

## 2014-02-18 DIAGNOSIS — Z87828 Personal history of other (healed) physical injury and trauma: Secondary | ICD-10-CM | POA: Insufficient documentation

## 2014-02-18 DIAGNOSIS — F329 Major depressive disorder, single episode, unspecified: Secondary | ICD-10-CM | POA: Diagnosis not present

## 2014-02-18 DIAGNOSIS — Z79899 Other long term (current) drug therapy: Secondary | ICD-10-CM | POA: Diagnosis not present

## 2014-02-18 DIAGNOSIS — Z7951 Long term (current) use of inhaled steroids: Secondary | ICD-10-CM | POA: Insufficient documentation

## 2014-02-18 DIAGNOSIS — J45901 Unspecified asthma with (acute) exacerbation: Secondary | ICD-10-CM | POA: Diagnosis not present

## 2014-02-18 DIAGNOSIS — F4310 Post-traumatic stress disorder, unspecified: Secondary | ICD-10-CM | POA: Insufficient documentation

## 2014-02-18 DIAGNOSIS — K219 Gastro-esophageal reflux disease without esophagitis: Secondary | ICD-10-CM | POA: Insufficient documentation

## 2014-02-18 DIAGNOSIS — Z720 Tobacco use: Secondary | ICD-10-CM | POA: Diagnosis not present

## 2014-02-18 LAB — URINALYSIS, ROUTINE W REFLEX MICROSCOPIC
Bilirubin Urine: NEGATIVE
GLUCOSE, UA: NEGATIVE mg/dL
HGB URINE DIPSTICK: NEGATIVE
Ketones, ur: NEGATIVE mg/dL
Leukocytes, UA: NEGATIVE
Nitrite: NEGATIVE
Protein, ur: NEGATIVE mg/dL
SPECIFIC GRAVITY, URINE: 1.004 — AB (ref 1.005–1.030)
Urobilinogen, UA: 0.2 mg/dL (ref 0.0–1.0)
pH: 6 (ref 5.0–8.0)

## 2014-02-18 LAB — I-STAT TROPONIN, ED: Troponin i, poc: 0 ng/mL (ref 0.00–0.08)

## 2014-02-18 LAB — CBC WITH DIFFERENTIAL/PLATELET
Basophils Absolute: 0 10*3/uL (ref 0.0–0.1)
Basophils Relative: 0 % (ref 0–1)
EOS PCT: 2 % (ref 0–5)
Eosinophils Absolute: 0.2 10*3/uL (ref 0.0–0.7)
HEMATOCRIT: 42.7 % (ref 36.0–46.0)
HEMOGLOBIN: 14.3 g/dL (ref 12.0–15.0)
Lymphocytes Relative: 56 % — ABNORMAL HIGH (ref 12–46)
Lymphs Abs: 3.5 10*3/uL (ref 0.7–4.0)
MCH: 28.9 pg (ref 26.0–34.0)
MCHC: 33.5 g/dL (ref 30.0–36.0)
MCV: 86.3 fL (ref 78.0–100.0)
MONO ABS: 0.3 10*3/uL (ref 0.1–1.0)
Monocytes Relative: 5 % (ref 3–12)
Neutro Abs: 2.3 10*3/uL (ref 1.7–7.7)
Neutrophils Relative %: 37 % — ABNORMAL LOW (ref 43–77)
PLATELETS: 252 10*3/uL (ref 150–400)
RBC: 4.95 MIL/uL (ref 3.87–5.11)
RDW: 12.7 % (ref 11.5–15.5)
WBC: 6.2 10*3/uL (ref 4.0–10.5)

## 2014-02-18 LAB — BASIC METABOLIC PANEL
ANION GAP: 9 (ref 5–15)
BUN: 6 mg/dL (ref 6–23)
CALCIUM: 9.7 mg/dL (ref 8.4–10.5)
CHLORIDE: 105 mmol/L (ref 96–112)
CO2: 26 mmol/L (ref 19–32)
Creatinine, Ser: 0.63 mg/dL (ref 0.50–1.10)
GFR calc Af Amer: >90 mL/min (ref >90)
GFR calc non Af Amer: >90 mL/min (ref >90)
Glucose, Bld: 136 mg/dL — ABNORMAL HIGH (ref 70–99)
Potassium: 2.9 mmol/L — ABNORMAL LOW (ref 3.5–5.1)
SODIUM: 140 mmol/L (ref 135–145)

## 2014-02-18 MED ORDER — ACETAMINOPHEN 500 MG PO TABS
1000.0000 mg | ORAL_TABLET | Freq: Once | ORAL | Status: AC
  Administered 2014-02-18: 1000 mg via ORAL
  Filled 2014-02-18: qty 2

## 2014-02-18 MED ORDER — POTASSIUM CHLORIDE CRYS ER 20 MEQ PO TBCR
40.0000 meq | EXTENDED_RELEASE_TABLET | Freq: Once | ORAL | Status: AC
  Administered 2014-02-18: 40 meq via ORAL
  Filled 2014-02-18: qty 2

## 2014-02-18 NOTE — Discharge Instructions (Signed)
Syncope °Syncope is a medical term for fainting or passing out. This means you lose consciousness and drop to the ground. People are generally unconscious for less than 5 minutes. You may have some muscle twitches for up to 15 seconds before waking up and returning to normal. Syncope occurs more often in older adults, but it can happen to anyone. While most causes of syncope are not dangerous, syncope can be a sign of a serious medical problem. It is important to seek medical care.  °CAUSES  °Syncope is caused by a sudden drop in blood flow to the brain. The specific cause is often not determined. Factors that can bring on syncope include: °· Taking medicines that lower blood pressure. °· Sudden changes in posture, such as standing up quickly. °· Taking more medicine than prescribed. °· Standing in one place for too long. °· Seizure disorders. °· Dehydration and excessive exposure to heat. °· Low blood sugar (hypoglycemia). °· Straining to have a bowel movement. °· Heart disease, irregular heartbeat, or other circulatory problems. °· Fear, emotional distress, seeing blood, or severe pain. °SYMPTOMS  °Right before fainting, you may: °· Feel dizzy or light-headed. °· Feel nauseous. °· See all white or all black in your field of vision. °· Have cold, clammy skin. °DIAGNOSIS  °Your health care provider will ask about your symptoms, perform a physical exam, and perform an electrocardiogram (ECG) to record the electrical activity of your heart. Your health care provider may also perform other heart or blood tests to determine the cause of your syncope which may include: °· Transthoracic echocardiogram (TTE). During echocardiography, sound waves are used to evaluate how blood flows through your heart. °· Transesophageal echocardiogram (TEE). °· Cardiac monitoring. This allows your health care provider to monitor your heart rate and rhythm in real time. °· Holter monitor. This is a portable device that records your  heartbeat and can help diagnose heart arrhythmias. It allows your health care provider to track your heart activity for several days, if needed. °· Stress tests by exercise or by giving medicine that makes the heart beat faster. °TREATMENT  °In most cases, no treatment is needed. Depending on the cause of your syncope, your health care provider may recommend changing or stopping some of your medicines. °HOME CARE INSTRUCTIONS °· Have someone stay with you until you feel stable. °· Do not drive, use machinery, or play sports until your health care provider says it is okay. °· Keep all follow-up appointments as directed by your health care provider. °· Lie down right away if you start feeling like you might faint. Breathe deeply and steadily. Wait until all the symptoms have passed. °· Drink enough fluids to keep your urine clear or pale yellow. °· If you are taking blood pressure or heart medicine, get up slowly and take several minutes to sit and then stand. This can reduce dizziness. °SEEK IMMEDIATE MEDICAL CARE IF:  °· You have a severe headache. °· You have unusual pain in the chest, abdomen, or back. °· You are bleeding from your mouth or rectum, or you have black or tarry stool. °· You have an irregular or very fast heartbeat. °· You have pain with breathing. °· You have repeated fainting or seizure-like jerking during an episode. °· You faint when sitting or lying down. °· You have confusion. °· You have trouble walking. °· You have severe weakness. °· You have vision problems. °If you fainted, call your local emergency services (911 in [place] Do not drive   yourself to the hospital.  °MAKE SURE YOU: °· Understand these instructions. °· Will watch your condition. °· Will get help right away if you are not doing well or get worse. °Document Released: 01/11/2005 Document Revised: 01/16/2013 Document Reviewed: 03/12/2011 °Exit[employer]® Patient Information ©2015 [employer]. This information is not intended to replace  advice given to you by your health care provider. Make sure you discuss any questions you have with your health care provider. ° °
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
## 2014-02-18 NOTE — ED Notes (Signed)
I gave the patient a cup of ice and a coke. 

## 2014-02-18 NOTE — ED Notes (Signed)
Pt requesting pain meds for headache and drink.
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
## 2014-10-02 ENCOUNTER — Other Ambulatory Visit (HOSPITAL_COMMUNITY): Payer: Self-pay | Admitting: Internal Medicine

## 2014-10-02 DIAGNOSIS — Z1231 Encounter for screening mammogram for malignant neoplasm of breast: Secondary | ICD-10-CM

## 2014-10-10 ENCOUNTER — Ambulatory Visit (HOSPITAL_COMMUNITY): Payer: Medicaid Other

## 2015-01-25 ENCOUNTER — Encounter (HOSPITAL_COMMUNITY): Payer: Self-pay

## 2015-01-25 ENCOUNTER — Emergency Department (HOSPITAL_COMMUNITY): Payer: Medicaid Other

## 2015-01-25 ENCOUNTER — Emergency Department (HOSPITAL_COMMUNITY)
Admission: EM | Admit: 2015-01-25 | Discharge: 2015-01-25 | Disposition: A | Discharge status: Home / Self Care | Payer: Medicaid Other | Attending: Emergency Medicine | Admitting: Emergency Medicine

## 2015-01-25 DIAGNOSIS — J45909 Unspecified asthma, uncomplicated: Secondary | ICD-10-CM | POA: Insufficient documentation

## 2015-01-25 DIAGNOSIS — Z8669 Personal history of other diseases of the nervous system and sense organs: Secondary | ICD-10-CM | POA: Insufficient documentation

## 2015-01-25 DIAGNOSIS — Z8739 Personal history of other diseases of the musculoskeletal system and connective tissue: Secondary | ICD-10-CM | POA: Insufficient documentation

## 2015-01-25 DIAGNOSIS — F17210 Nicotine dependence, cigarettes, uncomplicated: Secondary | ICD-10-CM | POA: Insufficient documentation

## 2015-01-25 DIAGNOSIS — Z7951 Long term (current) use of inhaled steroids: Secondary | ICD-10-CM | POA: Diagnosis not present

## 2015-01-25 DIAGNOSIS — Z8659 Personal history of other mental and behavioral disorders: Secondary | ICD-10-CM | POA: Diagnosis not present

## 2015-01-25 DIAGNOSIS — Z79899 Other long term (current) drug therapy: Secondary | ICD-10-CM | POA: Insufficient documentation

## 2015-01-25 DIAGNOSIS — K219 Gastro-esophageal reflux disease without esophagitis: Secondary | ICD-10-CM | POA: Diagnosis not present

## 2015-01-25 DIAGNOSIS — R079 Chest pain, unspecified: Secondary | ICD-10-CM | POA: Diagnosis not present

## 2015-01-25 DIAGNOSIS — Z8679 Personal history of other diseases of the circulatory system: Secondary | ICD-10-CM | POA: Diagnosis not present

## 2015-01-25 DIAGNOSIS — Z87828 Personal history of other (healed) physical injury and trauma: Secondary | ICD-10-CM | POA: Insufficient documentation

## 2015-01-25 LAB — BASIC METABOLIC PANEL
Anion gap: 12 (ref 5–15)
BUN: 11 mg/dL (ref 6–20)
CHLORIDE: 104 mmol/L (ref 101–111)
CO2: 23 mmol/L (ref 22–32)
Calcium: 9.3 mg/dL (ref 8.9–10.3)
Creatinine, Ser: 0.68 mg/dL (ref 0.44–1.00)
GFR calc Af Amer: >60 mL/min (ref >60)
GFR calc non Af Amer: >60 mL/min (ref >60)
Glucose, Bld: 132 mg/dL — ABNORMAL HIGH (ref 65–99)
POTASSIUM: 3.2 mmol/L — AB (ref 3.5–5.1)
Sodium: 139 mmol/L (ref 135–145)

## 2015-01-25 LAB — CBC
HCT: 40.4 % (ref 36.0–46.0)
HEMOGLOBIN: 13.6 g/dL (ref 12.0–15.0)
MCH: 30.2 pg (ref 26.0–34.0)
MCHC: 33.7 g/dL (ref 30.0–36.0)
MCV: 89.8 fL (ref 78.0–100.0)
Platelets: 294 10*3/uL (ref 150–400)
RBC: 4.5 MIL/uL (ref 3.87–5.11)
RDW: 12.9 % (ref 11.5–15.5)
WBC: 11.6 10*3/uL — ABNORMAL HIGH (ref 4.0–10.5)

## 2015-01-25 LAB — I-STAT TROPONIN, ED
Troponin i, poc: 0 ng/mL (ref 0.00–0.08)
Troponin i, poc: 0 ng/mL (ref 0.00–0.08)

## 2015-01-25 MED ORDER — ONDANSETRON HCL 4 MG/2ML IJ SOLN
4.0000 mg | Freq: Once | INTRAMUSCULAR | Status: AC
  Administered 2015-01-25: 4 mg via INTRAVENOUS
  Filled 2015-01-25: qty 2

## 2015-01-25 MED ORDER — FENTANYL CITRATE (PF) 100 MCG/2ML IJ SOLN
25.0000 ug | Freq: Once | INTRAMUSCULAR | Status: AC
  Administered 2015-01-25: 25 ug via INTRAVENOUS
  Filled 2015-01-25: qty 2

## 2015-01-25 MED ORDER — MORPHINE SULFATE (PF) 4 MG/ML IV SOLN
4.0000 mg | Freq: Once | INTRAVENOUS | Status: AC
  Administered 2015-01-25: 4 mg via INTRAVENOUS
  Filled 2015-01-25: qty 1

## 2015-01-25 MED ORDER — SODIUM CHLORIDE 0.9 % IV BOLUS (SEPSIS)
1000.0000 mL | Freq: Once | INTRAVENOUS | Status: AC
  Administered 2015-01-25: 1000 mL via INTRAVENOUS

## 2015-01-25 NOTE — ED Notes (Signed)
She is anxious; and I stay with her for a time to reassure her.  She is speaking with her sister on the phone as I write this.  She is in no distress.  Monitor shows nsr without ectopy and she continues to have normal, warm dry skin and is breathing normally.
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
## 2015-01-25 NOTE — Discharge Instructions (Signed)
Ms. Meghan Bowman,  Nice meeting you! Please follow-up with your primary care provider as needed. I have given you information for a cardiologist. Return to the emergency department if you develop chest pain again, shortness of breath, dizziness, nausea/vomiting. Feel better soon!  S. Lane HackerNicole Ragan Duhon, PA-C

## 2015-01-25 NOTE — ED Notes (Signed)
She c/o sudden onset of central chest "heaviness" "something sitting on my chest".  Her skin is normal, warm and dry and she is breathing normally.

## 2015-01-26 DIAGNOSIS — I2510 Atherosclerotic heart disease of native coronary artery without angina pectoris: Secondary | ICD-10-CM

## 2015-01-26 HISTORY — DX: Atherosclerotic heart disease of native coronary artery without angina pectoris: I25.10

## 2015-01-28 ENCOUNTER — Other Ambulatory Visit: Payer: Self-pay | Admitting: Cardiology

## 2015-01-28 DIAGNOSIS — R079 Chest pain, unspecified: Secondary | ICD-10-CM

## 2015-01-31 ENCOUNTER — Encounter (HOSPITAL_COMMUNITY): Payer: Medicaid Other

## 2015-01-31 ENCOUNTER — Encounter (HOSPITAL_COMMUNITY)
Admission: RE | Admit: 2015-01-31 | Discharge: 2015-01-31 | Disposition: A | Discharge status: Home / Self Care | Payer: Medicaid Other | Source: Ambulatory Visit | Attending: Cardiology | Admitting: Cardiology

## 2015-01-31 DIAGNOSIS — R079 Chest pain, unspecified: Secondary | ICD-10-CM | POA: Diagnosis not present

## 2015-01-31 DIAGNOSIS — I10 Essential (primary) hypertension: Secondary | ICD-10-CM | POA: Diagnosis not present

## 2015-01-31 DIAGNOSIS — R0602 Shortness of breath: Secondary | ICD-10-CM | POA: Diagnosis not present

## 2015-01-31 DIAGNOSIS — I5189 Other ill-defined heart diseases: Secondary | ICD-10-CM | POA: Insufficient documentation

## 2015-01-31 MED ORDER — REGADENOSON 0.4 MG/5ML IV SOLN: 0.4000 mg | Freq: Once | INTRAVENOUS | Status: DC

## 2015-01-31 MED ORDER — ALPRAZOLAM 0.25 MG PO TABS
ORAL_TABLET | ORAL | Status: AC
  Filled 2015-01-31: qty 1

## 2015-01-31 MED ORDER — ALPRAZOLAM 0.25 MG PO TABS
0.2500 mg | ORAL_TABLET | Freq: Once | ORAL | Status: AC
  Administered 2015-01-31: 0.25 mg via ORAL

## 2015-01-31 MED ORDER — TECHNETIUM TC 99M SESTAMIBI GENERIC - CARDIOLITE
10.0000 | Freq: Once | INTRAVENOUS | Status: AC | PRN
  Administered 2015-01-31: 10 via INTRAVENOUS

## 2015-01-31 MED ORDER — REGADENOSON 0.4 MG/5ML IV SOLN
INTRAVENOUS | Status: AC
  Administered 2015-01-31: 0.4 mg
  Filled 2015-01-31: qty 5

## 2015-01-31 MED ORDER — TECHNETIUM TC 99M SESTAMIBI GENERIC - CARDIOLITE
30.0000 | Freq: Once | INTRAVENOUS | Status: AC | PRN
  Administered 2015-01-31: 30 via INTRAVENOUS

## 2015-01-31 NOTE — Progress Notes (Signed)
C/o anxiety similar to PTSD episodes, Dr Sharyn LullHarwani notified xanax ordered and given per order

## 2015-02-01 LAB — NM MYOCAR MULTI W/SPECT W/WALL MOTION / EF
CHL CUP STRESS STAGE 1 GRADE: 0 %
CHL CUP STRESS STAGE 1 SPEED: 0 mph
CHL CUP STRESS STAGE 2 GRADE: 0 %
CHL CUP STRESS STAGE 2 HR: 79 {beats}/min
CHL CUP STRESS STAGE 3 GRADE: 0 %
CHL CUP STRESS STAGE 4 SPEED: 0 mph
CSEPEW: 1 METS
CSEPPBP: 121 mmHg
CSEPPMHR: 58 %
Peak HR: 96 {beats}/min
Stage 1 DBP: 42 mmHg
Stage 1 HR: 80 {beats}/min
Stage 1 SBP: 131 mmHg
Stage 2 Speed: 0 mph
Stage 3 DBP: 79 mmHg
Stage 3 HR: 104 {beats}/min
Stage 3 SBP: 130 mmHg
Stage 3 Speed: 0 mph
Stage 4 DBP: 81 mmHg
Stage 4 Grade: 0 %
Stage 4 HR: 96 {beats}/min
Stage 4 SBP: 121 mmHg

## 2015-02-11 ENCOUNTER — Encounter (HOSPITAL_COMMUNITY)
Admission: RE | Disposition: A | Discharge status: Home / Self Care | Payer: Self-pay | Source: Ambulatory Visit | Attending: Cardiology

## 2015-02-11 ENCOUNTER — Ambulatory Visit (HOSPITAL_COMMUNITY)
Admission: RE | Admit: 2015-02-11 | Discharge: 2015-02-11 | Disposition: A | Discharge status: Home / Self Care | Payer: Medicaid Other | Source: Ambulatory Visit | Attending: Cardiology | Admitting: Cardiology

## 2015-02-11 DIAGNOSIS — I25119 Atherosclerotic heart disease of native coronary artery with unspecified angina pectoris: Secondary | ICD-10-CM | POA: Insufficient documentation

## 2015-02-11 DIAGNOSIS — I209 Angina pectoris, unspecified: Secondary | ICD-10-CM | POA: Diagnosis not present

## 2015-02-11 HISTORY — PX: CARDIAC CATHETERIZATION: SHX172

## 2015-02-11 SURGERY — LEFT HEART CATH AND CORONARY ANGIOGRAPHY

## 2015-02-11 MED ORDER — SODIUM CHLORIDE 0.9 % IV SOLN: 250.0000 mL | INTRAVENOUS | Status: DC | PRN

## 2015-02-11 MED ORDER — ASPIRIN 81 MG PO CHEW
CHEWABLE_TABLET | ORAL | Status: AC
  Administered 2015-02-11: 81 mg via ORAL
  Filled 2015-02-11: qty 1

## 2015-02-11 MED ORDER — DIAZEPAM 5 MG PO TABS
ORAL_TABLET | ORAL | Status: AC
Start: 2015-02-11 — End: 2015-02-11
  Administered 2015-02-11: 5 mg via ORAL
  Filled 2015-02-11: qty 1

## 2015-02-11 MED ORDER — LIDOCAINE HCL (PF) 1 % IJ SOLN
INTRAMUSCULAR | Status: DC | PRN
  Administered 2015-02-11: 10:00:00

## 2015-02-11 MED ORDER — SODIUM CHLORIDE 0.9 % IV SOLN
INTRAVENOUS | Status: DC | PRN
  Administered 2015-02-11: 250 mL via INTRAVENOUS

## 2015-02-11 MED ORDER — ONDANSETRON HCL 4 MG/2ML IJ SOLN: 4.0000 mg | Freq: Four times a day (QID) | INTRAMUSCULAR | Status: DC | PRN

## 2015-02-11 MED ORDER — MIDAZOLAM HCL 2 MG/2ML IJ SOLN
INTRAMUSCULAR | Status: DC | PRN
  Administered 2015-02-11: 1 mg via INTRAVENOUS
  Administered 2015-02-11: 2 mg via INTRAVENOUS

## 2015-02-11 MED ORDER — SODIUM CHLORIDE 0.9 % IJ SOLN: 3.0000 mL | INTRAMUSCULAR | Status: DC | PRN

## 2015-02-11 MED ORDER — FENTANYL CITRATE (PF) 100 MCG/2ML IJ SOLN
INTRAMUSCULAR | Status: AC
  Filled 2015-02-11: qty 2

## 2015-02-11 MED ORDER — VERAPAMIL HCL 2.5 MG/ML IV SOLN
INTRAVENOUS | Status: AC
  Filled 2015-02-11: qty 2

## 2015-02-11 MED ORDER — SODIUM CHLORIDE 0.9 % IJ SOLN: 3.0000 mL | Freq: Two times a day (BID) | INTRAMUSCULAR | Status: DC

## 2015-02-11 MED ORDER — DIAZEPAM 5 MG PO TABS
5.0000 mg | ORAL_TABLET | ORAL | Status: DC | PRN
  Administered 2015-02-11: 5 mg via ORAL

## 2015-02-11 MED ORDER — HEPARIN SODIUM (PORCINE) 1000 UNIT/ML IJ SOLN
INTRAMUSCULAR | Status: AC
  Filled 2015-02-11: qty 1

## 2015-02-11 MED ORDER — LIDOCAINE HCL (PF) 1 % IJ SOLN
INTRAMUSCULAR | Status: AC
  Filled 2015-02-11: qty 30

## 2015-02-11 MED ORDER — SODIUM CHLORIDE 0.9 % WEIGHT BASED INFUSION: 3.0000 mL/kg/h | INTRAVENOUS | Status: DC

## 2015-02-11 MED ORDER — HEPARIN (PORCINE) IN NACL 2-0.9 UNIT/ML-% IJ SOLN
INTRAMUSCULAR | Status: AC
  Filled 2015-02-11: qty 1000

## 2015-02-11 MED ORDER — ASPIRIN 81 MG PO CHEW
81.0000 mg | CHEWABLE_TABLET | ORAL | Status: AC
  Administered 2015-02-11: 81 mg via ORAL

## 2015-02-11 MED ORDER — SODIUM CHLORIDE 0.9 % WEIGHT BASED INFUSION
1.0000 mL/kg/h | INTRAVENOUS | Status: DC
  Administered 2015-02-11: 1 mL/kg/h via INTRAVENOUS

## 2015-02-11 MED ORDER — FENTANYL CITRATE (PF) 100 MCG/2ML IJ SOLN
INTRAMUSCULAR | Status: DC | PRN
  Administered 2015-02-11: 25 ug via INTRAVENOUS
  Administered 2015-02-11: 50 ug via INTRAVENOUS

## 2015-02-11 MED ORDER — DIAZEPAM 5 MG PO TABS
5.0000 mg | ORAL_TABLET | ORAL | Status: AC
  Administered 2015-02-11: 5 mg via ORAL
  Filled 2015-02-11: qty 1

## 2015-02-11 MED ORDER — ACETAMINOPHEN 325 MG PO TABS: 650.0000 mg | ORAL_TABLET | ORAL | Status: DC | PRN

## 2015-02-11 MED ORDER — IOHEXOL 350 MG/ML SOLN
INTRAVENOUS | Status: DC | PRN
  Administered 2015-02-11: 45 mL via INTRACARDIAC

## 2015-02-11 MED ORDER — DIAZEPAM 5 MG PO TABS
ORAL_TABLET | ORAL | Status: AC
  Administered 2015-02-11: 5 mg via ORAL
  Filled 2015-02-11: qty 1

## 2015-02-11 MED ORDER — MIDAZOLAM HCL 2 MG/2ML IJ SOLN
INTRAMUSCULAR | Status: AC
  Filled 2015-02-11: qty 2

## 2015-02-11 MED ORDER — SODIUM CHLORIDE 0.9 % WEIGHT BASED INFUSION: 3.0000 mL/kg/h | INTRAVENOUS | Status: AC

## 2015-02-11 SURGICAL SUPPLY — 7 items
CATH INFINITI 5FR MULTPACK ANG (CATHETERS) ×3 IMPLANT
KIT HEART LEFT (KITS) ×3 IMPLANT
PACK CARDIAC CATHETERIZATION (CUSTOM PROCEDURE TRAY) ×3 IMPLANT
SHEATH PINNACLE 5F 10CM (SHEATH) ×3 IMPLANT
SYR MEDRAD MARK V 150ML (SYRINGE) ×3 IMPLANT
TRANSDUCER W/STOPCOCK (MISCELLANEOUS) ×3 IMPLANT
WIRE EMERALD 3MM-J .035X150CM (WIRE) ×3 IMPLANT

## 2015-02-11 NOTE — H&P (Signed)
Dictated H&P in the chart needs to be scanned 

## 2015-02-11 NOTE — Interval H&P Note (Signed)
Cath Lab Visit (complete for each Cath Lab visit)  Clinical Evaluation Leading to the Procedure:   ACS: No.  Non-ACS:    Anginal Classification: CCS IV  Anti-ischemic medical therapy: Minimal Therapy (1 class of medications)  Non-Invasive Test Results: Low-risk stress test findings: cardiac mortality <1%/year  Prior CABG: No previous CABG      History and Physical Interval Note:  02/11/2015 9:04 AM  Meghan Bowman  has presented today for surgery, with the diagnosis of abnormal stress test/cp  The various methods of treatment have been discussed with the patient and family. After consideration of risks, benefits and other options for treatment, the patient has consented to  Procedure(s): Left Heart Cath and Coronary Angiography (N/A) as a surgical intervention .  The patient's history has been reviewed, patient examined, no change in status, stable for surgery.  I have reviewed the patient's chart and labs.  Questions were answered to the patient's satisfaction.     Rinaldo Cloud

## 2015-02-11 NOTE — Progress Notes (Signed)
Site area: rfa Site Prior to Removal:  Level 0 Pressure Applied For:36min Manual:   yes Patient Status During Pull:  stable Post Pull Site:  Level 0 Post Pull Instructions Given:  yes Post Pull Pulses Present: palpable Dressing Applied:clear   Bedrest begins @ 1035 till 1435 Comments:

## 2015-02-11 NOTE — Discharge Instructions (Signed)
Angiogram, Care After °Refer to this sheet in the next few weeks. These instructions provide you with information about caring for yourself after your procedure. Your health care provider may also give you more specific instructions. Your treatment has been planned according to current medical practices, but problems sometimes occur. Call your health care provider if you have any problems or questions after your procedure. °WHAT TO EXPECT AFTER THE PROCEDURE °After your procedure, it is typical to have the following: °· Bruising at the catheter insertion site that usually fades within 1-2 weeks. °· Blood collecting in the tissue (hematoma) that may be painful to the touch. It should usually decrease in size and tenderness within 1-2 weeks. °HOME CARE INSTRUCTIONS °· Take medicines only as directed by your health care provider. °· You may shower 24-48 hours after the procedure or as directed by your health care provider. Remove the bandage (dressing) and gently wash the site with plain soap and water. Pat the area dry with a clean towel. Do not rub the site, because this may cause bleeding. °· Do not take baths, swim, or use a hot tub until your health care provider approves. °· Check your insertion site every day for redness, swelling, or drainage. °· Do not apply powder or lotion to the site. °· Do not lift over 10 lb (4.5 kg) for 5 days after your procedure or as directed by your health care provider. °· Ask your health care provider when it is okay to: °¨ Return to work or school. °¨ Resume usual physical activities or sports. °¨ Resume sexual activity. °· Do not drive home if you are discharged the same day as the procedure. Have someone else drive you. °· You may drive 24 hours after the procedure unless otherwise instructed by your health care provider. °· Do not operate machinery or power tools for 24 hours after the procedure or as directed by your health care provider. °· If your procedure was done as an  outpatient procedure, which means that you went home the same day as your procedure, a responsible adult should be with you for the first 24 hours after you arrive home. °· Keep all follow-up visits as directed by your health care provider. This is important. °SEEK MEDICAL CARE IF: °· You have a fever. °· You have chills. °· You have increased bleeding from the catheter insertion site. Hold pressure on the site and call 911. °SEEK IMMEDIATE MEDICAL CARE IF: °· You have unusual pain at the catheter insertion site. °· You have redness, warmth, or swelling at the catheter insertion site. °· You have drainage (other than a small amount of blood on the dressing) from the catheter insertion site. °· The catheter insertion site is bleeding, and the bleeding does not stop after 30 minutes of holding steady pressure on the site. °· The area near or just beyond the catheter insertion site becomes pale, cool, tingly, or numb. °  °This information is not intended to replace advice given to you by your health care provider. Make sure you discuss any questions you have with your health care provider. °  °Document Released: 07/30/2004 Document Revised: 02/01/2014 Document Reviewed: 06/14/2012 °Elsevier Interactive Patient Education ©2016 [employer] ° °

## 2015-02-12 ENCOUNTER — Encounter (HOSPITAL_COMMUNITY): Payer: Self-pay | Admitting: Cardiology

## 2015-03-10 ENCOUNTER — Other Ambulatory Visit: Payer: Self-pay | Admitting: Gastroenterology

## 2015-03-10 DIAGNOSIS — R1013 Epigastric pain: Secondary | ICD-10-CM

## 2015-03-13 ENCOUNTER — Ambulatory Visit
Admission: RE | Admit: 2015-03-13 | Discharge: 2015-03-13 | Disposition: A | Discharge status: Home / Self Care | Payer: Medicaid Other | Source: Ambulatory Visit | Attending: Gastroenterology | Admitting: Gastroenterology

## 2015-03-13 DIAGNOSIS — R1013 Epigastric pain: Secondary | ICD-10-CM

## 2015-03-13 MED ORDER — IOPAMIDOL (ISOVUE-300) INJECTION 61%
125.0000 mL | Freq: Once | INTRAVENOUS | Status: AC | PRN
  Administered 2015-03-13: 125 mL via INTRAVENOUS

## 2015-03-20 ENCOUNTER — Encounter (HOSPITAL_COMMUNITY): Payer: Self-pay | Admitting: Emergency Medicine

## 2015-03-20 ENCOUNTER — Emergency Department (HOSPITAL_COMMUNITY): Payer: Medicaid Other

## 2015-03-20 ENCOUNTER — Emergency Department (HOSPITAL_COMMUNITY)
Admission: EM | Admit: 2015-03-20 | Discharge: 2015-03-20 | Disposition: A | Discharge status: Home / Self Care | Payer: Medicaid Other | Attending: Emergency Medicine | Admitting: Emergency Medicine

## 2015-03-20 DIAGNOSIS — Z8659 Personal history of other mental and behavioral disorders: Secondary | ICD-10-CM | POA: Diagnosis not present

## 2015-03-20 DIAGNOSIS — Z79899 Other long term (current) drug therapy: Secondary | ICD-10-CM | POA: Insufficient documentation

## 2015-03-20 DIAGNOSIS — F17210 Nicotine dependence, cigarettes, uncomplicated: Secondary | ICD-10-CM | POA: Insufficient documentation

## 2015-03-20 DIAGNOSIS — Z7982 Long term (current) use of aspirin: Secondary | ICD-10-CM | POA: Insufficient documentation

## 2015-03-20 DIAGNOSIS — R0789 Other chest pain: Secondary | ICD-10-CM | POA: Diagnosis not present

## 2015-03-20 DIAGNOSIS — R001 Bradycardia, unspecified: Secondary | ICD-10-CM | POA: Diagnosis not present

## 2015-03-20 DIAGNOSIS — R079 Chest pain, unspecified: Secondary | ICD-10-CM | POA: Diagnosis present

## 2015-03-20 DIAGNOSIS — Z7951 Long term (current) use of inhaled steroids: Secondary | ICD-10-CM | POA: Diagnosis not present

## 2015-03-20 DIAGNOSIS — Z8669 Personal history of other diseases of the nervous system and sense organs: Secondary | ICD-10-CM | POA: Diagnosis not present

## 2015-03-20 DIAGNOSIS — J45901 Unspecified asthma with (acute) exacerbation: Secondary | ICD-10-CM | POA: Diagnosis not present

## 2015-03-20 DIAGNOSIS — M1712 Unilateral primary osteoarthritis, left knee: Secondary | ICD-10-CM | POA: Insufficient documentation

## 2015-03-20 DIAGNOSIS — Z87828 Personal history of other (healed) physical injury and trauma: Secondary | ICD-10-CM | POA: Insufficient documentation

## 2015-03-20 DIAGNOSIS — K219 Gastro-esophageal reflux disease without esophagitis: Secondary | ICD-10-CM | POA: Diagnosis not present

## 2015-03-20 DIAGNOSIS — Z8679 Personal history of other diseases of the circulatory system: Secondary | ICD-10-CM | POA: Insufficient documentation

## 2015-03-20 DIAGNOSIS — Z9889 Other specified postprocedural states: Secondary | ICD-10-CM | POA: Insufficient documentation

## 2015-03-20 LAB — BASIC METABOLIC PANEL
ANION GAP: 7 (ref 5–15)
BUN: 9 mg/dL (ref 6–20)
CALCIUM: 9.2 mg/dL (ref 8.9–10.3)
CO2: 25 mmol/L (ref 22–32)
Chloride: 107 mmol/L (ref 101–111)
Creatinine, Ser: 0.82 mg/dL (ref 0.44–1.00)
GFR calc Af Amer: >60 mL/min (ref >60)
GFR calc non Af Amer: >60 mL/min (ref >60)
GLUCOSE: 119 mg/dL — AB (ref 65–99)
Potassium: 3.4 mmol/L — ABNORMAL LOW (ref 3.5–5.1)
Sodium: 139 mmol/L (ref 135–145)

## 2015-03-20 LAB — CBC
HCT: 38.7 % (ref 36.0–46.0)
HEMOGLOBIN: 12.8 g/dL (ref 12.0–15.0)
MCH: 30.9 pg (ref 26.0–34.0)
MCHC: 33.1 g/dL (ref 30.0–36.0)
MCV: 93.5 fL (ref 78.0–100.0)
Platelets: 227 10*3/uL (ref 150–400)
RBC: 4.14 MIL/uL (ref 3.87–5.11)
RDW: 13.1 % (ref 11.5–15.5)
WBC: 6.8 10*3/uL (ref 4.0–10.5)

## 2015-03-20 LAB — I-STAT TROPONIN, ED: TROPONIN I, POC: 0 ng/mL (ref 0.00–0.08)

## 2015-03-20 MED ORDER — GI COCKTAIL ~~LOC~~: 30.0000 mL | Freq: Once | ORAL | Status: DC
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
## 2015-03-20 NOTE — Discharge Instructions (Signed)
Esophagitis °Esophagitis is inflammation of the esophagus. The esophagus is the tube that carries food and liquids from your mouth to your stomach. Esophagitis can cause soreness or pain in the esophagus. This condition can make it difficult and painful to swallow.  °CAUSES °Most causes of esophagitis are not serious. Common causes of this condition include: °· Gastroesophageal reflux disease (GERD). This is when stomach contents move back up into the esophagus (reflux). °· Repeated vomiting. °· An allergic-type reaction, especially caused by food allergies (eosinophilic esophagitis). °· Injury to the esophagus by swallowing large pills with or without water, or swallowing certain types of medicines. °· Swallowing (ingesting) harmful chemicals, such as household cleaning products. °· Heavy alcohol use. °· An infection of the esophagus. This most often occurs in people who have a weakened immune system. °· Radiation or chemotherapy treatment for cancer. °· Certain diseases such as sarcoidosis, Crohn disease, and scleroderma. °SYMPTOMS °Symptoms of this condition include: °· Difficult or painful swallowing. °· Pain with swallowing acidic liquids, such as citrus juices. °· Pain with burping. °· Chest pain. °· Difficulty breathing. °· Nausea. °· Vomiting. °· Pain in the abdomen. °· Weight loss. °· Ulcers in the mouth. °· Patches of white material in the mouth (candidiasis). °· Fever. °· Coughing up blood or vomiting blood. °· Stool that is black, tarry, or bright red. °DIAGNOSIS °Your health care provider will take a medical history and perform a physical exam. You may also have other tests, including: °· An endoscopy to examine your stomach and esophagus with a small camera. °· A test that measures the acidity level in your esophagus. °· A test that measures how much pressure is on your esophagus. °· A barium swallow or modified barium swallow to show the shape, size, and functioning of your esophagus. °· Allergy  tests. °TREATMENT °Treatment for this condition depends on the cause of your esophagitis. In some cases, steroids or other medicines may be given to help relieve your symptoms or to treat the underlying cause of your condition. You may have to make some lifestyle changes, such as: °· Avoiding alcohol. °· Quitting smoking. °· Changing your diet. °· Exercising. °· Changing your sleep habits and your sleep environment. °HOME CARE INSTRUCTIONS °Take these actions to decrease your discomfort and to help avoid complications. °Diet °· Follow a diet as recommended by your health care provider. This may involve avoiding foods and drinks such as: °¨ Coffee and tea (with or without caffeine). °¨ Drinks that contain alcohol. °¨ Energy drinks and sports drinks. °¨ Carbonated drinks or sodas. °¨ Chocolate and cocoa. °¨ Peppermint and mint flavorings. °¨ Garlic and onions. °¨ Horseradish. °¨ Spicy and acidic foods, including peppers, chili powder, curry powder, vinegar, hot sauces, and barbecue sauce. °¨ Citrus fruit juices and citrus fruits, such as oranges, lemons, and limes. °¨ Tomato-based foods, such as red sauce, chili, salsa, and pizza with red sauce. °¨ Fried and fatty foods, such as donuts, french fries, potato chips, and high-fat dressings. °¨ High-fat meats, such as hot dogs and fatty cuts of red and white meats, such as rib eye steak, sausage, ham, and bacon. °¨ High-fat dairy items, such as whole milk, butter, and cream cheese. °· Eat small, frequent meals instead of large meals. °· Avoid drinking large amounts of liquid with your meals. °· Avoid eating meals during the 2-3 hours before bedtime. °· Avoid lying down right after you eat. °· Do not exercise right after you eat. °· Avoid foods and drinks that seem to   make your symptoms worse. °General Instructions °· Pay attention to any changes in your symptoms. °· Take over-the-counter and prescription medicines only as told by your health care provider. Do not take  aspirin, ibuprofen, or other NSAIDs unless your health care provider told you to do so. °· If you have trouble taking pills, use a pill splitter to decrease the size of the pill. This will decrease the chance of the pill getting stuck or injuring your esophagus on the way down. Also, drink water after you take a pill. °· Do not use any tobacco products, including cigarettes, chewing tobacco, and e-cigarettes. If you need help quitting, ask your health care provider. °· Wear loose-fitting clothing. Do not wear anything tight around your waist that causes pressure on your abdomen. °· Raise (elevate) the head of your bed about 6 inches (15 cm). °· Try to reduce your stress, such as with yoga or meditation. If you need help reducing stress, ask your health care provider. °· If you are overweight, reduce your weight to an amount that is healthy for you. Ask your health care provider for guidance about a safe weight loss goal. °· Keep all follow-up visits as told by your health care provider. This is important. °SEEK MEDICAL CARE IF: °· You have new symptoms. °· You have unexplained weight loss. °· You have difficulty swallowing, or it hurts to swallow. °· You have wheezing or a persistent cough. °· Your symptoms do not improve with treatment. °· You have frequent heartburn for more than two weeks. °SEEK IMMEDIATE MEDICAL CARE IF: °· You have severe pain in your arms, neck, jaw, teeth, or back. °· You feel sweaty, dizzy, or light-headed. °· You have chest pain or shortness of breath. °· You vomit and your vomit looks like blood or coffee grounds. °· Your stool is bloody or black. °· You have a fever. °· You cannot swallow, drink, or eat. °  °This information is not intended to replace advice given to you by your health care provider. Make sure you discuss any questions you have with your health care provider. °  °Document Released: 02/19/2004 Document Revised: 10/02/2014 Document Reviewed: 05/08/2014 °Elsevier Interactive  Patient Education ©2016 [employer] ° °

## 2015-03-20 NOTE — ED Notes (Addendum)
Pt c.o chest pain and SOB x 55 days. Pt has LAD lesion, 40% stenosed. Pt also mentions she has hernia on esophagus.

## 2015-03-21 ENCOUNTER — Other Ambulatory Visit: Payer: Self-pay | Admitting: Gastroenterology

## 2015-04-15 ENCOUNTER — Encounter (HOSPITAL_COMMUNITY): Payer: Self-pay | Admitting: Neurology

## 2015-04-15 ENCOUNTER — Emergency Department (HOSPITAL_COMMUNITY)
Admission: EM | Admit: 2015-04-15 | Discharge: 2015-04-15 | Disposition: A | Discharge status: Home / Self Care | Payer: Medicaid Other | Attending: Emergency Medicine | Admitting: Emergency Medicine

## 2015-04-15 DIAGNOSIS — Z87828 Personal history of other (healed) physical injury and trauma: Secondary | ICD-10-CM | POA: Insufficient documentation

## 2015-04-15 DIAGNOSIS — R1013 Epigastric pain: Secondary | ICD-10-CM | POA: Insufficient documentation

## 2015-04-15 DIAGNOSIS — M1990 Unspecified osteoarthritis, unspecified site: Secondary | ICD-10-CM | POA: Diagnosis not present

## 2015-04-15 DIAGNOSIS — Z9889 Other specified postprocedural states: Secondary | ICD-10-CM | POA: Diagnosis not present

## 2015-04-15 DIAGNOSIS — R110 Nausea: Secondary | ICD-10-CM | POA: Insufficient documentation

## 2015-04-15 DIAGNOSIS — Z9851 Tubal ligation status: Secondary | ICD-10-CM | POA: Diagnosis not present

## 2015-04-15 DIAGNOSIS — Z79899 Other long term (current) drug therapy: Secondary | ICD-10-CM | POA: Diagnosis not present

## 2015-04-15 DIAGNOSIS — J45909 Unspecified asthma, uncomplicated: Secondary | ICD-10-CM | POA: Diagnosis not present

## 2015-04-15 DIAGNOSIS — G43909 Migraine, unspecified, not intractable, without status migrainosus: Secondary | ICD-10-CM | POA: Insufficient documentation

## 2015-04-15 DIAGNOSIS — Z7982 Long term (current) use of aspirin: Secondary | ICD-10-CM | POA: Insufficient documentation

## 2015-04-15 DIAGNOSIS — K219 Gastro-esophageal reflux disease without esophagitis: Secondary | ICD-10-CM | POA: Insufficient documentation

## 2015-04-15 DIAGNOSIS — E876 Hypokalemia: Secondary | ICD-10-CM | POA: Insufficient documentation

## 2015-04-15 DIAGNOSIS — R55 Syncope and collapse: Secondary | ICD-10-CM | POA: Diagnosis present

## 2015-04-15 DIAGNOSIS — F17210 Nicotine dependence, cigarettes, uncomplicated: Secondary | ICD-10-CM | POA: Insufficient documentation

## 2015-04-15 DIAGNOSIS — F419 Anxiety disorder, unspecified: Secondary | ICD-10-CM | POA: Diagnosis not present

## 2015-04-15 DIAGNOSIS — R42 Dizziness and giddiness: Secondary | ICD-10-CM | POA: Insufficient documentation

## 2015-04-15 DIAGNOSIS — R197 Diarrhea, unspecified: Secondary | ICD-10-CM | POA: Insufficient documentation

## 2015-04-15 LAB — BASIC METABOLIC PANEL
Anion gap: 15 (ref 5–15)
BUN: 9 mg/dL (ref 6–20)
CHLORIDE: 98 mmol/L — AB (ref 101–111)
CO2: 23 mmol/L (ref 22–32)
CREATININE: 0.88 mg/dL (ref 0.44–1.00)
Calcium: 9.8 mg/dL (ref 8.9–10.3)
GFR calc Af Amer: >60 mL/min (ref >60)
GFR calc non Af Amer: >60 mL/min (ref >60)
GLUCOSE: 145 mg/dL — AB (ref 65–99)
Potassium: 2.7 mmol/L — CL (ref 3.5–5.1)
Sodium: 136 mmol/L (ref 135–145)

## 2015-04-15 LAB — CBC
HCT: 41.6 % (ref 36.0–46.0)
Hemoglobin: 14.2 g/dL (ref 12.0–15.0)
MCH: 30.4 pg (ref 26.0–34.0)
MCHC: 34.1 g/dL (ref 30.0–36.0)
MCV: 89.1 fL (ref 78.0–100.0)
PLATELETS: 244 10*3/uL (ref 150–400)
RBC: 4.67 MIL/uL (ref 3.87–5.11)
RDW: 12.3 % (ref 11.5–15.5)
WBC: 9.5 10*3/uL (ref 4.0–10.5)

## 2015-04-15 LAB — CBG MONITORING, ED: Glucose-Capillary: 118 mg/dL — ABNORMAL HIGH (ref 65–99)

## 2015-04-15 LAB — I-STAT TROPONIN, ED: TROPONIN I, POC: 0 ng/mL (ref 0.00–0.08)

## 2015-04-15 LAB — MAGNESIUM: MAGNESIUM: 1.7 mg/dL (ref 1.7–2.4)

## 2015-04-15 LAB — ETHANOL

## 2015-04-15 MED ORDER — FENTANYL CITRATE (PF) 100 MCG/2ML IJ SOLN: 50.0000 ug | INTRAMUSCULAR | Status: DC | PRN

## 2015-04-15 MED ORDER — POTASSIUM CHLORIDE CRYS ER 20 MEQ PO TBCR
40.0000 meq | EXTENDED_RELEASE_TABLET | Freq: Once | ORAL | Status: AC
  Administered 2015-04-15: 40 meq via ORAL
  Filled 2015-04-15: qty 2

## 2015-04-15 MED ORDER — SODIUM CHLORIDE 0.9 % IV BOLUS (SEPSIS)
1000.0000 mL | Freq: Once | INTRAVENOUS | Status: AC
  Administered 2015-04-15: 1000 mL via INTRAVENOUS

## 2015-04-15 MED ORDER — FENTANYL CITRATE (PF) 100 MCG/2ML IJ SOLN
50.0000 ug | Freq: Once | INTRAMUSCULAR | Status: AC
  Administered 2015-04-15: 50 ug via INTRAVENOUS
  Filled 2015-04-15: qty 2

## 2015-04-15 MED ORDER — ONDANSETRON HCL 4 MG/2ML IJ SOLN
4.0000 mg | Freq: Once | INTRAMUSCULAR | Status: AC
  Administered 2015-04-15: 4 mg via INTRAVENOUS
  Filled 2015-04-15: qty 2

## 2015-04-15 MED ORDER — POTASSIUM CHLORIDE ER 10 MEQ PO TBCR: 10.0000 meq | EXTENDED_RELEASE_TABLET | Freq: Three times a day (TID) | ORAL | Status: DC

## 2015-04-15 MED ORDER — POTASSIUM CHLORIDE 10 MEQ/100ML IV SOLN
10.0000 meq | INTRAVENOUS | Status: AC
  Administered 2015-04-15 (×2): 10 meq via INTRAVENOUS
  Filled 2015-04-15 (×2): qty 100

## 2015-04-15 NOTE — ED Notes (Addendum)
Per ems- pt was at [facility]/u for treatment for H. Pylori. Pt was sitting in the chair and had syncopal episode last about 10 secs. When pt woke up she had episode of vomiting. Ems gave 4 mg zofran. EKG SR. Pt is a x 4 at current. Pt denies cp or sob. Pt reports feeling fine prior to episode, she drove herself to the doctor. Pt c/o epigastric pain, which she has had for 80 days which is why she is seeing GI. Has 20 to LAC. Pt did not hit her head.

## 2015-04-15 NOTE — ED Notes (Signed)
Critical K+2.7 reported to Dr Clydene PughKnott, acuity changed to 2. Pt will get next available room.
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
## 2015-04-15 NOTE — ED Notes (Signed)
CBG 118 

## 2015-04-15 NOTE — Discharge Instructions (Signed)
Hypokalemia °Hypokalemia means that the amount of potassium in the blood is lower than normal. Potassium is a chemical, called an electrolyte, that helps regulate the amount of fluid in the body. It also stimulates muscle contraction and helps nerves function properly. Most of the body's potassium is inside of cells, and only a very small amount is in the blood. Because the amount in the blood is so small, minor changes can be life-threatening. °CAUSES °· Antibiotics. °· Diarrhea or vomiting. °· Using laxatives too much, which can cause diarrhea. °· Chronic kidney disease. °· Water pills (diuretics). °· Eating disorders (bulimia). °· Low magnesium level. °· Sweating a lot. °SIGNS AND SYMPTOMS °· Weakness. °· Constipation. °· Fatigue. °· Muscle cramps. °· Mental confusion. °· Skipped heartbeats or irregular heartbeat (palpitations). °· Tingling or numbness. °DIAGNOSIS  °Your health care provider can diagnose hypokalemia with blood tests. In addition to checking your potassium level, your health care provider may also check other lab tests. °TREATMENT °Hypokalemia can be treated with potassium supplements taken by mouth or adjustments in your current medicines. If your potassium level is very low, you may need to get potassium through a vein (IV) and be monitored in the hospital. A diet high in potassium is also helpful. Foods high in potassium are: °· Nuts, such as peanuts and pistachios. °· Seeds, such as sunflower seeds and pumpkin seeds. °· Peas, lentils, and lima beans. °· Whole grain and bran cereals and breads. °· Fresh fruit and vegetables, such as apricots, avocado, bananas, cantaloupe, kiwi, oranges, tomatoes, asparagus, and potatoes. °· Orange and tomato juices. °· Red meats. °· Fruit yogurt. °HOME CARE INSTRUCTIONS °· Take all medicines as prescribed by your health care provider. °· Maintain a healthy diet by including nutritious food, such as fruits, vegetables, nuts, whole grains, and lean meats. °· If  you are taking a laxative, be sure to follow the directions on the label. °SEEK MEDICAL CARE IF: °· Your weakness gets worse. °· You feel your heart pounding or racing. °· You are vomiting or having diarrhea. °· You are diabetic and having trouble keeping your blood glucose in the normal range. °SEEK IMMEDIATE MEDICAL CARE IF: °· You have chest pain, shortness of breath, or dizziness. °· You are vomiting or having diarrhea for more than 2 days. °· You faint. °MAKE SURE YOU:  °· Understand these instructions. °· Will watch your condition. °· Will get help right away if you are not doing well or get worse. °  °This information is not intended to replace advice given to you by your health care provider. Make sure you discuss any questions you have with your health care provider. °  °Document Released: 01/11/2005 Document Revised: 02/01/2014 Document Reviewed: 07/14/2012 °Elsevier Interactive Patient Education ©2016 [employer] ° °Syncope, commonly known as fainting, is a temporary loss of consciousness. It occurs when the blood flow to the brain is reduced. Vasovagal syncope (also called neurocardiogenic syncope) is a fainting spell in which the blood flow to the brain is reduced because of a sudden drop in heart rate and blood pressure. Vasovagal syncope occurs when the brain and the cardiovascular system (blood vessels) do not adequately communicate and respond to each other. This is the most common cause of fainting. It often occurs in response to fear or some other type of emotional or physical stress. The body has a reaction in which the heart starts beating too slowly or the blood vessels expand, reducing blood pressure. This type of fainting spell is generally considered harmless.   However, injuries can occur if a person takes a sudden fall during a fainting spell.  °CAUSES  °Vasovagal syncope occurs when a person's blood pressure and heart rate decrease suddenly, usually in response to a trigger. Many things  and situations can trigger an episode. Some of these include:  °· Pain.   °· Fear.   °· The sight of blood or medical procedures, such as blood being drawn from a vein.   °· Common activities, such as coughing, swallowing, stretching, or going to the bathroom.   °· Emotional stress.   °· Prolonged standing, especially in a warm environment.   °· Lack of sleep or rest.   °· Prolonged lack of food.   °· Prolonged lack of fluids.   °· Recent illness. °· The use of certain drugs that affect blood pressure, such as cocaine, alcohol, marijuana, inhalants, and opiates.   °SYMPTOMS  °Before the fainting episode, you may:  °· Feel dizzy or light headed.   °· Become pale. °· Sense that you are going to faint.   °· Feel like the room is spinning.   °· Have tunnel vision, only seeing directly in front of you.   °· Feel sick to your stomach (nauseous).   °· See spots or slowly lose vision.   °· Hear ringing in your ears.   °· Have a headache.   °· Feel warm and sweaty.   °· Feel a sensation of pins and needles. °During the fainting spell, you will generally be unconscious for no longer than a couple minutes before waking up and returning to normal. If you get up too quickly before your body can recover, you may faint again. Some twitching or jerky movements may occur during the fainting spell.  °DIAGNOSIS  °Your health care provider will ask about your symptoms, take a medical history, and perform a physical exam. Various tests may be done to rule out other causes of fainting. These may include blood tests and tests to check the heart, such as electrocardiography, echocardiography, and possibly an electrophysiology study. When other causes have been ruled out, a test may be done to check the body's response to changes in position (tilt table test). °TREATMENT  °Most cases of vasovagal syncope do not require treatment. Your health care provider may recommend ways to avoid fainting triggers and may provide home strategies for  preventing fainting. If you must be exposed to a possible trigger, you can drink additional fluids to help reduce your chances of having an episode of vasovagal syncope. If you have warning signs of an oncoming episode, you can respond by positioning yourself favorably (lying down). °If your fainting spells continue, you may be given medicines to prevent fainting. Some medicines may help make you more resistant to repeated episodes of vasovagal syncope. Special exercises or compression stockings may be recommended. In rare cases, the surgical placement of a pacemaker is considered. °HOME CARE INSTRUCTIONS  °· Learn to identify the warning signs of vasovagal syncope.   °· Sit or lie down at the first warning sign of a fainting spell. If sitting, put your head down between your legs. If you lie down, swing your legs up in the air to increase blood flow to the brain.   °· Avoid hot tubs and saunas. °· Avoid prolonged standing. °· Drink enough fluids to keep your urine clear or pale yellow. Avoid caffeine. °· Increase salt in your diet as directed by your health care provider.   °· If you have to stand for a long time, perform movements such as:   °¨ Crossing your legs.   °¨ Flexing and stretching your leg   muscles.   °¨ Squatting.   °¨ Moving your legs.   °¨ Bending over.   °· Only take over-the-counter or prescription medicines as directed by your health care provider. Do not suddenly stop any medicines without asking your health care provider first.  °SEEK MEDICAL CARE IF:  °· Your fainting spells continue or happen more frequently in spite of treatment.   °· You lose consciousness for more than a couple minutes. °· You have fainting spells during or after exercising or after being startled.   °· You have new symptoms that occur with the fainting spells, such as:   °¨ Shortness of breath. °¨ Chest pain.   °¨ Irregular heartbeat.   °· You have episodes of twitching or jerky movements that last longer than a few  seconds. °· You have episodes of twitching or jerky movements without obvious fainting. °SEEK IMMEDIATE MEDICAL CARE IF:  °· You have injuries or bleeding after a fainting spell.   °· You have episodes of twitching or jerky movements that last longer than 5 minutes.   °· You have more than one spell of twitching or jerky movements before returning to consciousness after fainting. °  °This information is not intended to replace advice given to you by your health care provider. Make sure you discuss any questions you have with your health care provider. °  °Document Released: 12/29/2011 Document Revised: 05/28/2014 Document Reviewed: 12/29/2011 °Elsevier Interactive Patient Education ©2016 [employer] ° °

## 2016-06-09 ENCOUNTER — Emergency Department (HOSPITAL_COMMUNITY): Payer: Medicaid Other

## 2016-06-09 ENCOUNTER — Encounter (HOSPITAL_COMMUNITY): Payer: Self-pay | Admitting: *Deleted

## 2016-06-09 ENCOUNTER — Observation Stay (HOSPITAL_COMMUNITY)
Admission: EM | Admit: 2016-06-09 | Discharge: 2016-06-10 | Disposition: A | Discharge status: Home / Self Care | Payer: Medicaid Other | Attending: Family Medicine | Admitting: Family Medicine

## 2016-06-09 DIAGNOSIS — F329 Major depressive disorder, single episode, unspecified: Secondary | ICD-10-CM | POA: Insufficient documentation

## 2016-06-09 DIAGNOSIS — Z720 Tobacco use: Secondary | ICD-10-CM | POA: Diagnosis present

## 2016-06-09 DIAGNOSIS — Z96652 Presence of left artificial knee joint: Secondary | ICD-10-CM | POA: Diagnosis not present

## 2016-06-09 DIAGNOSIS — Z8249 Family history of ischemic heart disease and other diseases of the circulatory system: Secondary | ICD-10-CM | POA: Insufficient documentation

## 2016-06-09 DIAGNOSIS — R079 Chest pain, unspecified: Secondary | ICD-10-CM | POA: Diagnosis present

## 2016-06-09 DIAGNOSIS — E785 Hyperlipidemia, unspecified: Secondary | ICD-10-CM | POA: Insufficient documentation

## 2016-06-09 DIAGNOSIS — I2510 Atherosclerotic heart disease of native coronary artery without angina pectoris: Secondary | ICD-10-CM | POA: Diagnosis present

## 2016-06-09 DIAGNOSIS — K219 Gastro-esophageal reflux disease without esophagitis: Secondary | ICD-10-CM | POA: Insufficient documentation

## 2016-06-09 DIAGNOSIS — I25119 Atherosclerotic heart disease of native coronary artery with unspecified angina pectoris: Secondary | ICD-10-CM

## 2016-06-09 DIAGNOSIS — J441 Chronic obstructive pulmonary disease with (acute) exacerbation: Principal | ICD-10-CM | POA: Insufficient documentation

## 2016-06-09 DIAGNOSIS — Z79899 Other long term (current) drug therapy: Secondary | ICD-10-CM | POA: Insufficient documentation

## 2016-06-09 DIAGNOSIS — I10 Essential (primary) hypertension: Secondary | ICD-10-CM | POA: Diagnosis not present

## 2016-06-09 DIAGNOSIS — R0789 Other chest pain: Secondary | ICD-10-CM | POA: Insufficient documentation

## 2016-06-09 DIAGNOSIS — F17210 Nicotine dependence, cigarettes, uncomplicated: Secondary | ICD-10-CM | POA: Insufficient documentation

## 2016-06-09 DIAGNOSIS — J329 Chronic sinusitis, unspecified: Secondary | ICD-10-CM | POA: Diagnosis not present

## 2016-06-09 DIAGNOSIS — Z6837 Body mass index (BMI) 37.0-37.9, adult: Secondary | ICD-10-CM | POA: Insufficient documentation

## 2016-06-09 DIAGNOSIS — I200 Unstable angina: Secondary | ICD-10-CM

## 2016-06-09 DIAGNOSIS — E7800 Pure hypercholesterolemia, unspecified: Secondary | ICD-10-CM | POA: Diagnosis not present

## 2016-06-09 DIAGNOSIS — J449 Chronic obstructive pulmonary disease, unspecified: Secondary | ICD-10-CM | POA: Diagnosis present

## 2016-06-09 DIAGNOSIS — Z7982 Long term (current) use of aspirin: Secondary | ICD-10-CM | POA: Insufficient documentation

## 2016-06-09 LAB — BASIC METABOLIC PANEL
Anion gap: 9 (ref 5–15)
CALCIUM: 9.2 mg/dL (ref 8.9–10.3)
CO2: 22 mmol/L (ref 22–32)
CREATININE: 0.71 mg/dL (ref 0.44–1.00)
Chloride: 107 mmol/L (ref 101–111)
GFR calc Af Amer: >60 mL/min (ref >60)
GFR calc non Af Amer: >60 mL/min (ref >60)
GLUCOSE: 132 mg/dL — AB (ref 65–99)
Potassium: 3.7 mmol/L (ref 3.5–5.1)
Sodium: 138 mmol/L (ref 135–145)

## 2016-06-09 LAB — URINALYSIS, ROUTINE W REFLEX MICROSCOPIC
BILIRUBIN URINE: NEGATIVE
GLUCOSE, UA: NEGATIVE mg/dL
Hgb urine dipstick: NEGATIVE
KETONES UR: NEGATIVE mg/dL
Leukocytes, UA: NEGATIVE
Nitrite: NEGATIVE
PH: 6 (ref 5.0–8.0)
Protein, ur: NEGATIVE mg/dL
SPECIFIC GRAVITY, URINE: 1.001 — AB (ref 1.005–1.030)

## 2016-06-09 LAB — I-STAT TROPONIN, ED
TROPONIN I, POC: 0 ng/mL (ref 0.00–0.08)
Troponin i, poc: 0 ng/mL (ref 0.00–0.08)

## 2016-06-09 LAB — TROPONIN I

## 2016-06-09 LAB — D-DIMER, QUANTITATIVE (NOT AT ARMC): D DIMER QUANT: 0.49 ug{FEU}/mL (ref 0.00–0.50)

## 2016-06-09 LAB — CBC
HCT: 39.8 % (ref 36.0–46.0)
Hemoglobin: 12.8 g/dL (ref 12.0–15.0)
MCH: 29.4 pg (ref 26.0–34.0)
MCHC: 32.2 g/dL (ref 30.0–36.0)
MCV: 91.3 fL (ref 78.0–100.0)
PLATELETS: 254 10*3/uL (ref 150–400)
RBC: 4.36 MIL/uL (ref 3.87–5.11)
RDW: 13.2 % (ref 11.5–15.5)
WBC: 8.6 10*3/uL (ref 4.0–10.5)

## 2016-06-09 LAB — LIPASE, BLOOD: LIPASE: 39 U/L (ref 11–51)

## 2016-06-09 LAB — RAPID URINE DRUG SCREEN, HOSP PERFORMED
AMPHETAMINES: NOT DETECTED
BARBITURATES: NOT DETECTED
BENZODIAZEPINES: NOT DETECTED
COCAINE: NOT DETECTED
Opiates: NOT DETECTED
Tetrahydrocannabinol: NOT DETECTED

## 2016-06-09 MED ORDER — PREDNISONE 20 MG PO TABS
60.0000 mg | ORAL_TABLET | Freq: Once | ORAL | Status: AC
  Administered 2016-06-09: 60 mg via ORAL
  Filled 2016-06-09: qty 3

## 2016-06-09 MED ORDER — ACETAMINOPHEN 325 MG PO TABS
650.0000 mg | ORAL_TABLET | ORAL | Status: DC | PRN
  Administered 2016-06-10 (×2): 650 mg via ORAL
  Filled 2016-06-09 (×2): qty 2

## 2016-06-09 MED ORDER — ASPIRIN 81 MG PO CHEW
324.0000 mg | CHEWABLE_TABLET | Freq: Once | ORAL | Status: AC
  Administered 2016-06-09: 324 mg via ORAL
  Filled 2016-06-09: qty 4

## 2016-06-09 MED ORDER — HEPARIN BOLUS VIA INFUSION
4000.0000 [IU] | Freq: Once | INTRAVENOUS | Status: AC
  Administered 2016-06-09: 4000 [IU] via INTRAVENOUS
  Filled 2016-06-09: qty 4000

## 2016-06-09 MED ORDER — ONDANSETRON HCL 4 MG/2ML IJ SOLN: 4.0000 mg | Freq: Four times a day (QID) | INTRAMUSCULAR | Status: DC | PRN

## 2016-06-09 MED ORDER — NITROGLYCERIN 0.4 MG SL SUBL
0.4000 mg | SUBLINGUAL_TABLET | SUBLINGUAL | Status: DC | PRN
  Filled 2016-06-09: qty 1

## 2016-06-09 MED ORDER — MOMETASONE FURO-FORMOTEROL FUM 100-5 MCG/ACT IN AERO
2.0000 | INHALATION_SPRAY | Freq: Two times a day (BID) | RESPIRATORY_TRACT | Status: DC
  Administered 2016-06-10: 2 via RESPIRATORY_TRACT
  Filled 2016-06-09: qty 8.8

## 2016-06-09 MED ORDER — NITROGLYCERIN 2 % TD OINT
0.5000 [in_us] | TOPICAL_OINTMENT | Freq: Three times a day (TID) | TRANSDERMAL | Status: DC
  Administered 2016-06-09 – 2016-06-10 (×3): 0.5 [in_us] via TOPICAL
  Filled 2016-06-09: qty 30
  Filled 2016-06-09: qty 1
  Filled 2016-06-09 (×3): qty 30

## 2016-06-09 MED ORDER — HEPARIN (PORCINE) IN NACL 100-0.45 UNIT/ML-% IJ SOLN
1000.0000 [IU]/h | INTRAMUSCULAR | Status: DC
  Administered 2016-06-09: 1000 [IU]/h via INTRAVENOUS
  Filled 2016-06-09: qty 250

## 2016-06-09 MED ORDER — LORATADINE 10 MG PO TABS
10.0000 mg | ORAL_TABLET | Freq: Every day | ORAL | Status: DC
  Administered 2016-06-10: 10 mg via ORAL
  Filled 2016-06-09: qty 1

## 2016-06-09 MED ORDER — PREDNISONE 20 MG PO TABS: 60.0000 mg | ORAL_TABLET | Freq: Every day | ORAL | Status: DC

## 2016-06-09 MED ORDER — SERTRALINE HCL 100 MG PO TABS
200.0000 mg | ORAL_TABLET | Freq: Every day | ORAL | Status: DC
  Administered 2016-06-09: 200 mg via ORAL
  Filled 2016-06-09: qty 2

## 2016-06-09 MED ORDER — CYCLOBENZAPRINE HCL 10 MG PO TABS
10.0000 mg | ORAL_TABLET | Freq: Once | ORAL | Status: AC
  Administered 2016-06-09: 10 mg via ORAL
  Filled 2016-06-09: qty 1

## 2016-06-09 MED ORDER — ATORVASTATIN CALCIUM 10 MG PO TABS
10.0000 mg | ORAL_TABLET | Freq: Every day | ORAL | Status: DC
  Administered 2016-06-10: 10 mg via ORAL
  Filled 2016-06-09: qty 1

## 2016-06-09 MED ORDER — ASPIRIN EC 81 MG PO TBEC
81.0000 mg | DELAYED_RELEASE_TABLET | Freq: Every day | ORAL | Status: DC
  Administered 2016-06-10: 81 mg via ORAL
  Filled 2016-06-09: qty 1

## 2016-06-09 MED ORDER — ALBUTEROL SULFATE HFA 108 (90 BASE) MCG/ACT IN AERS
6.0000 | INHALATION_SPRAY | Freq: Once | RESPIRATORY_TRACT | Status: AC
  Administered 2016-06-09: 6 via RESPIRATORY_TRACT
  Filled 2016-06-09: qty 6.7

## 2016-06-09 MED ORDER — METOPROLOL TARTRATE 12.5 MG HALF TABLET
12.5000 mg | ORAL_TABLET | Freq: Two times a day (BID) | ORAL | Status: DC
  Administered 2016-06-09 – 2016-06-10 (×2): 12.5 mg via ORAL
  Filled 2016-06-09 (×2): qty 1

## 2016-06-09 MED ORDER — PANTOPRAZOLE SODIUM 20 MG PO TBEC
20.0000 mg | DELAYED_RELEASE_TABLET | Freq: Every day | ORAL | Status: DC
  Administered 2016-06-10: 20 mg via ORAL
  Filled 2016-06-09: qty 1
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
## 2016-06-09 NOTE — Progress Notes (Signed)
Patient admitted from ED. Alert and Oriented. Telemetry applied and CCMD called. Skin assessmnent done . Oriented to room and surroundings. VSS.

## 2016-06-09 NOTE — ED Triage Notes (Signed)
Pt reports recent sinus infection. Onset one hour ago of mid chest pain and feels difficult taking a deep breath. Reports mild cough, non productive. No acute distress is noted and spo2 100% at triage.
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
## 2016-06-09 NOTE — ED Notes (Signed)
MD at bedside. 
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
## 2016-06-10 ENCOUNTER — Observation Stay (HOSPITAL_COMMUNITY): Payer: Medicaid Other

## 2016-06-10 DIAGNOSIS — I10 Essential (primary) hypertension: Secondary | ICD-10-CM | POA: Diagnosis not present

## 2016-06-10 DIAGNOSIS — R072 Precordial pain: Secondary | ICD-10-CM

## 2016-06-10 DIAGNOSIS — K219 Gastro-esophageal reflux disease without esophagitis: Secondary | ICD-10-CM | POA: Diagnosis not present

## 2016-06-10 DIAGNOSIS — F329 Major depressive disorder, single episode, unspecified: Secondary | ICD-10-CM | POA: Diagnosis not present

## 2016-06-10 DIAGNOSIS — R0789 Other chest pain: Secondary | ICD-10-CM | POA: Diagnosis not present

## 2016-06-10 DIAGNOSIS — I25119 Atherosclerotic heart disease of native coronary artery with unspecified angina pectoris: Secondary | ICD-10-CM | POA: Diagnosis not present

## 2016-06-10 DIAGNOSIS — J441 Chronic obstructive pulmonary disease with (acute) exacerbation: Secondary | ICD-10-CM | POA: Diagnosis not present

## 2016-06-10 LAB — CBC
HCT: 35 % — ABNORMAL LOW (ref 36.0–46.0)
Hemoglobin: 11.5 g/dL — ABNORMAL LOW (ref 12.0–15.0)
MCH: 29.7 pg (ref 26.0–34.0)
MCHC: 32.9 g/dL (ref 30.0–36.0)
MCV: 90.4 fL (ref 78.0–100.0)
PLATELETS: 225 10*3/uL (ref 150–400)
RBC: 3.87 MIL/uL (ref 3.87–5.11)
RDW: 13.5 % (ref 11.5–15.5)
WBC: 8.8 10*3/uL (ref 4.0–10.5)

## 2016-06-10 LAB — LIPID PANEL
Cholesterol: 134 mg/dL (ref 0–200)
HDL: 49 mg/dL (ref >40)
LDL CALC: 75 mg/dL (ref 0–99)
Total CHOL/HDL Ratio: 2.7 RATIO
Triglycerides: 52 mg/dL (ref <150)
VLDL: 10 mg/dL (ref 0–40)

## 2016-06-10 LAB — TROPONIN I: Troponin I: <0.03 ng/mL

## 2016-06-10 LAB — HEPARIN LEVEL (UNFRACTIONATED)
HEPARIN UNFRACTIONATED: 0.34 [IU]/mL (ref 0.30–0.70)
HEPARIN UNFRACTIONATED: 0.21 [IU]/mL — AB (ref 0.30–0.70)

## 2016-06-10 LAB — HIV ANTIBODY (ROUTINE TESTING W REFLEX): HIV Screen 4th Generation wRfx: NONREACTIVE

## 2016-06-10 MED ORDER — TECHNETIUM TC 99M TETROFOSMIN IV KIT
10.0000 | PACK | Freq: Once | INTRAVENOUS | Status: AC | PRN
  Administered 2016-06-10: 10 via INTRAVENOUS

## 2016-06-10 MED ORDER — CYCLOBENZAPRINE HCL 10 MG PO TABS
10.0000 mg | ORAL_TABLET | Freq: Three times a day (TID) | ORAL | Status: DC | PRN
  Administered 2016-06-10: 10 mg via ORAL
  Filled 2016-06-10: qty 1

## 2016-06-10 MED ORDER — NICOTINE 21 MG/24HR TD PT24
21.0000 mg | MEDICATED_PATCH | Freq: Every day | TRANSDERMAL | Status: DC
  Administered 2016-06-10 (×2): 21 mg via TRANSDERMAL
  Filled 2016-06-10 (×2): qty 1

## 2016-06-10 MED ORDER — DOXYCYCLINE HYCLATE 100 MG PO TABS
100.0000 mg | ORAL_TABLET | Freq: Two times a day (BID) | ORAL | Status: DC
  Administered 2016-06-10 (×2): 100 mg via ORAL
  Filled 2016-06-10 (×2): qty 1

## 2016-06-10 MED ORDER — REGADENOSON 0.4 MG/5ML IV SOLN
INTRAVENOUS | Status: AC
Start: 2016-06-10 — End: 2016-06-10
  Administered 2016-06-10: 0.4 mg via INTRAVENOUS
  Filled 2016-06-10: qty 5

## 2016-06-10 MED ORDER — REGADENOSON 0.4 MG/5ML IV SOLN
0.4000 mg | Freq: Once | INTRAVENOUS | Status: AC
  Administered 2016-06-10: 0.4 mg via INTRAVENOUS
  Filled 2016-06-10: qty 5

## 2016-06-10 MED ORDER — DOXYCYCLINE HYCLATE 100 MG PO TABS: 100.0000 mg | ORAL_TABLET | Freq: Two times a day (BID) | ORAL | 0 refills | Status: DC

## 2016-06-10 MED ORDER — IPRATROPIUM BROMIDE 0.02 % IN SOLN
0.5000 mg | Freq: Four times a day (QID) | RESPIRATORY_TRACT | Status: DC
  Administered 2016-06-10 (×3): 0.5 mg via RESPIRATORY_TRACT
  Filled 2016-06-10 (×3): qty 2.5

## 2016-06-10 MED ORDER — KETOROLAC TROMETHAMINE 15 MG/ML IJ SOLN
15.0000 mg | Freq: Three times a day (TID) | INTRAMUSCULAR | Status: DC | PRN
  Administered 2016-06-10: 15 mg via INTRAVENOUS
  Filled 2016-06-10: qty 1

## 2016-06-10 MED ORDER — NICOTINE 7 MG/24HR TD PT24: 7.0000 mg | MEDICATED_PATCH | Freq: Every day | TRANSDERMAL | 0 refills | Status: DC

## 2016-06-10 MED ORDER — ALPRAZOLAM 0.25 MG PO TABS
ORAL_TABLET | ORAL | Status: AC
  Filled 2016-06-10: qty 1

## 2016-06-10 MED ORDER — PREDNISONE 20 MG PO TABS
40.0000 mg | ORAL_TABLET | Freq: Every day | ORAL | Status: DC
  Administered 2016-06-10: 40 mg via ORAL
  Filled 2016-06-10: qty 2

## 2016-06-10 MED ORDER — ALPRAZOLAM 0.25 MG PO TABS
0.2500 mg | ORAL_TABLET | Freq: Once | ORAL | Status: AC
Start: 2016-06-10 — End: 2016-06-10
  Administered 2016-06-10: 0.25 mg via ORAL

## 2016-06-10 MED ORDER — PREDNISONE 20 MG PO TABS: 40.0000 mg | ORAL_TABLET | Freq: Every day | ORAL | 0 refills | Status: DC

## 2016-06-10 MED ORDER — TECHNETIUM TC 99M TETROFOSMIN IV KIT
30.0000 | PACK | Freq: Once | INTRAVENOUS | Status: AC | PRN
  Administered 2016-06-10: 30 via INTRAVENOUS

## 2016-06-10 MED ORDER — LEVALBUTEROL HCL 1.25 MG/0.5ML IN NEBU: 1.2500 mg | INHALATION_SOLUTION | RESPIRATORY_TRACT | Status: DC | PRN

## 2016-06-10 NOTE — Progress Notes (Signed)
Subjective:  Denies any anginal chest pain.  States breathing is improved.  Tolerated stress portion of the nuclear stress test.  No new ischemic changes noted.  Lexiscan Myoview also shows no evidence of ischemia with normal LV systolic function  Objective:  Vital Signs in the last 24 hours: Temp:  [97.6 F (36.4 C)-98.7 F (37.1 C)] 97.8 F (36.6 C) (05/17 1311) Pulse Rate:  [60-107] 94 (05/17 1311) Resp:  [14-25] 19 (05/17 1311) BP: (101-137)/(58-97) 137/85 (05/17 1311) SpO2:  [94 %-100 %] 97 % (05/17 1539) Weight:  [100.2 kg (221 lb)-102.5 kg (225 lb 14.4 oz)] 102.5 kg (225 lb 14.4 oz) (05/16 2351)  Intake/Output from previous day: 05/16 0701 - 05/17 0700 In: -  Out: 900 [Urine:900] Intake/Output from this shift: Total I/O In: -  Out: 800 [Urine:800]  Physical Exam: Neck: no adenopathy, no carotid bruit, no JVD and supple, symmetrical, trachea midline Lungs: clear to auscultation bilaterally Heart: regular rate and rhythm, S1, S2 normal and soft systolic murmur noted Abdomen: soft, non-tender; bowel sounds normal; no masses,  no organomegaly Extremities: extremities normal, atraumatic, no cyanosis or edema  Lab Results:  Recent Labs  06/09/16 1432 06/10/16 0325  WBC 8.6 8.8  HGB 12.8 11.5*  PLT 254 225    Recent Labs  06/09/16 1432  NA 138  K 3.7  CL 107  CO2 22  GLUCOSE 132*  BUN <5*  CREATININE 0.71    Recent Labs  06/10/16 0325 06/10/16 0942  TROPONINI <0.03 <0.03   Hepatic Function Panel No results for input(s): PROT, ALBUMIN, AST, ALT, ALKPHOS, BILITOT, BILIDIR, IBILI in the last 72 hours.  Recent Labs  06/10/16 0325  CHOL 134   No results for input(s): PROTIME in the last 72 hours.  Imaging: Imaging results have been reviewed and Dg Chest 2 View  Result Date: 06/09/2016 CLINICAL DATA:  Chest pain along with SOB and weakness today - has been battling a sinus infection for 3 weeks now - hx of smoker, htn, diabetes, asthma, GERD, sleep  apnea EXAM: CHEST  2 VIEW COMPARISON:  03/20/2015 FINDINGS: Cardiac silhouette is normal in size and configuration. No mediastinal or hilar masses or evidence of adenopathy. There is mild subsegmental atelectasis at the left lateral lung base. Mildly prominent bronchovascular markings are noted bilaterally. There is a vague focal opacity in the right upper lobe near the right apex that may reflect superimposed bronchovascular structures are small nodule. There is no evidence of pneumonia or pulmonary edema. No pleural effusion or pneumothorax. Skeletal structures are demineralized but intact. IMPRESSION: 1. No acute cardiopulmonary disease. 2. Possible small right upper lobe nodule. Recommend nonemergent followup chest CT without contrast. Electronically Signed   By: Amie Portlandavid  Ormond M.D.   On: 06/09/2016 15:37   Nm Myocar Multi W/spect W/wall Motion / Ef  Result Date: 06/10/2016 CLINICAL DATA:  Chest pain. Smoking. Ethanol use. Hypertension. COPD. EXAM: MYOCARDIAL IMAGING WITH SPECT (REST AND PHARMACOLOGIC-STRESS) GATED LEFT VENTRICULAR WALL MOTION STUDY LEFT VENTRICULAR EJECTION FRACTION TECHNIQUE: Standard myocardial SPECT imaging was performed after resting intravenous injection of 10 mCi Tc-2633m tetrofosmin. Subsequently, intravenous infusion of Lexiscan was performed under the supervision of the Cardiology staff. At peak effect of the drug, 30 mCi Tc-3733m tetrofosmin was injected intravenously and standard myocardial SPECT imaging was performed. Quantitative gated imaging was also performed to evaluate left ventricular wall motion, and estimate left ventricular ejection fraction. COMPARISON:  Chest radiograph of 06/09/2016. FINDINGS: Perfusion: No decreased activity in the left ventricle on stress  imaging to suggest reversible ischemia or infarction. Wall Motion: Normal left ventricular wall motion. No left ventricular dilation. Left Ventricular Ejection Fraction: 76 % End diastolic volume 68 ml End systolic  volume 16 ml IMPRESSION: 1. No reversible ischemia or infarction. 2. Normal left ventricular wall motion. 3. Left ventricular ejection fraction 76% 4. Non invasive risk stratification*: Low *2012 Appropriate Use Criteria for Coronary Revascularization Focused Update: J Am Coll Cardiol. 2012;59(9):857-881. http://content.dementiazones.com.aspx?articleid=[email or web address] Electronically Signed   By: Jeronimo Greaves M.D.   On: 06/10/2016 15:45    Cardiac Studies:  Assessment/Plan:  Atypical chest pain with some features worrisome for angina with mildly abnormal EKG MI ruled out.  Negative lexiscan Myoview Hypertension Hyperlipidemia Morbid obesity COPD Bronchial asthma Questionable right upper lung nodule GERD Tobacco abuse Positive family history of coronary artery disease PTSD Resolving sinusitis Elevated blood sugar secondary to steroids Plan Okay to discharge from cardiac point of view  LOS: 0 days    Rinaldo Cloud 06/10/2016, 3:53 PM

## 2016-06-10 NOTE — Progress Notes (Addendum)
Nutrition Consult/Brief Note  RD consulted per COPD Gold/Focused Protocol.  Wt Readings from Last 15 Encounters:  06/09/16 225 lb 14.4 oz (102.5 kg)  02/11/15 208 lb (94.3 kg)  11/13/13 216 lb 12.8 oz (98.3 kg)  11/07/13 216 lb 12.8 oz (98.3 kg)  08/07/13 214 lb (97.1 kg)  10/02/12 217 lb 9.6 oz (98.7 kg)  07/25/12 214 lb 8.1 oz (97.3 kg)  11/02/11 204 lb (92.5 kg)  10/29/11 204 lb 3.2 oz (92.6 kg)  10/06/11 203 lb (92.1 kg)  09/16/11 201 lb (91.2 kg)  02/13/10 198 lb 12.8 oz (90.2 kg)  10/29/09 193 lb 8 oz (87.8 kg)  09/03/09 196 lb (88.9 kg)  03/20/08 195 lb (88.5 kg)   Body mass index is 37.59 kg/m. Patient meets criteria for Obesity Class II based on current BMI.   Current diet order is Heart Healthy.  No nutrition problems identified PTA.  Labs and medications reviewed.   No nutrition interventions warranted at this time. If nutrition issues arise, please consult RD.   Maureen ChattersKatie Dave Mergen, RD, LDN Pager #: [phone] After-Hours Pager #: [phone]

## 2016-06-10 NOTE — Progress Notes (Signed)
Pt c/o of a retosternal chest pain, currently rating it a 7/10. Describes pain as burning and aching. Denies radiation of pain. Pt is refusing nitroglycerin. Currently is on heparin drip  MD notification in process. Will continue to monitor.    06/10/16 0801  Vitals  BP 106/65  MAP (mmHg) 74  BP Location Right Arm  BP Method Automatic  Patient Position (if appropriate) Lying  Pulse Rate 72  Pulse Rate Source Dinamap  ECG Heart Rate 70  Cardiac Rhythm NSR  Resp 14  Oxygen Therapy  SpO2 99 %  O2 Device Room Air  Pain Assessment  Pain Assessment 0-10  Pain Score 7  Pain Type Acute pain  Pain Location Chest  Pain Orientation Right  Pain Descriptors / Indicators Aching;Burning  Pain Frequency Constant  Pain Onset On-going  Pain Intervention(s) MD notified (Comment);Other (Comment);Emotional support (pt refusing nitro. will notify MD)  Glasgow Coma Scale  Eye Opening 4  Best Verbal Response (NON-intubated) 5  Best Motor Response 6  Glasgow Coma Scale Score 15

## 2016-06-10 NOTE — Progress Notes (Signed)
PROGRESS NOTE  Meghan FischerVictoria J Lepage  ZOX:[id number] DOB: Sep 17, 1960 DOA: 06/09/2016 PCP: Fleet ContrasAvbuere, Edwin, MD   Brief Narrative: Meghan Bowman is a 56 y.o. female with a history of nonobstructive CAD, tobacco use, possible allergic asthma, HTN, HLD, GERD and alcohol abuse in remission who presented 5/16 for moderate retrosternal pleuritic chest pain/"heaviness" and wheezing. ECG showed NSR and minor ST-T wave changes anteriorly, troponins were negative during observation, and Dr. Sharyn LullHarwani was consulted. Stress test was ordered.   Assessment & Plan: Active Problems:   Tobacco abuse   Chest pain   HTN (hypertension)   COPD with acute exacerbation (HCC)   CAD (coronary artery disease)  Chest pain: History of nonobstructive cath in 2017 (January 2017 distal mild to moderate LAD stenosis). Enzymes negative x5. D-dimer negative. UDS negative. - ASA, heparin gtt, beta blocker - Stress test 5/17 per Dr. Sharyn LullHarwani - LDL 75   HTN:  - Continue home medications  COPD with mild exacerbation: Wheezing resolved on exam this AM. - Continue d[employer] formulary for home symbicort - Will plan steroid taper, finish doxycycline course, follow up with pulmonology for consideration of PFTs.  - Bronchodilators  Tobacco use: Pt wants to quit - Nicotine patch to be ordered at discharge - Follow up cessation efforts as outpatient  DVT prophylaxis: Heparin Code Status: Full Family Communication: None Disposition Plan: Home  Consultants:   Cardiology, Dr. Sharyn LullHarwani  Procedures:   Steffanie DunnLexiscan myoview 5/17  Antimicrobials:  Doxycycline 5/16 >>    Subjective: Wheezing resolved, no dyspnea this AM. Chest pain improved.   Objective: Vitals:   06/10/16 1128 06/10/16 1129 06/10/16 1131 06/10/16 1133  BP: (!) 130/97 (!) 125/93 118/86 119/87  Pulse: (!) 107 (!) 104 100   Resp:      Temp:      TempSrc:      SpO2:      Weight:      Height:        Intake/Output Summary (Last 24 hours) at 06/10/16 1149 Last  data filed at 06/10/16 0531  Gross per 24 hour  Intake                0 ml  Output              900 ml  Net             -900 ml   Filed Weights   06/09/16 2030 06/09/16 2351  Weight: 100.2 kg (221 lb) 102.5 kg (225 lb 14.4 oz)    Examination: General exam: 56 y.o. female in no distress  Respiratory system: Non-labored breathing room air. Clear to auscultation bilaterally.  Cardiovascular system: Regular rate and rhythm. No murmur, rub, or gallop. No JVD, and no pedal edema. Gastrointestinal system: Abdomen soft, non-tender, non-distended, with normoactive bowel sounds. No organomegaly or masses felt. Central nervous system: Alert and oriented. No focal neurological deficits. Extremities: Warm, no deformities Skin: No rashes, lesions no ulcers Psychiatry: Judgement and insight appear normal. Mood & affect appropriate.   Data Reviewed: I have personally reviewed following labs and imaging studies  CBC:  Recent Labs Lab 06/09/16 1432 06/10/16 0325  WBC 8.6 8.8  HGB 12.8 11.5*  HCT 39.8 35.0*  MCV 91.3 90.4  PLT 254 225   Basic Metabolic Panel:  Recent Labs Lab 06/09/16 1432  NA 138  K 3.7  CL 107  CO2 22  GLUCOSE 132*  BUN <5*  CREATININE 0.71  CALCIUM 9.2   GFR: Estimated Creatinine Clearance: 93.2  mL/min (by C-G formula based on SCr of 0.71 mg/dL). Liver Function Tests: No results for input(s): AST, ALT, ALKPHOS, BILITOT, PROT, ALBUMIN in the last 168 hours.  Recent Labs Lab 06/09/16 2315  LIPASE 39   No results for input(s): AMMONIA in the last 168 hours. Coagulation Profile: No results for input(s): INR, PROTIME in the last 168 hours. Cardiac Enzymes:  Recent Labs Lab 06/09/16 2025 06/09/16 2315 06/10/16 0325 06/10/16 0942  TROPONINI <0.03 <0.03 <0.03 <0.03   BNP (last 3 results) No results for input(s): PROBNP in the last 8760 hours. HbA1C: No results for input(s): HGBA1C in the last 72 hours. CBG: No results for input(s): GLUCAP in the  last 168 hours. Lipid Profile:  Recent Labs  06/10/16 0325  CHOL 134  HDL 49  LDLCALC 75  TRIG 52  CHOLHDL 2.7   Thyroid Function Tests: No results for input(s): TSH, T4TOTAL, FREET4, T3FREE, THYROIDAB in the last 72 hours. Anemia Panel: No results for input(s): VITAMINB12, FOLATE, FERRITIN, TIBC, IRON, RETICCTPCT in the last 72 hours. Urine analysis:    Component Value Date/Time   COLORURINE COLORLESS (A) 06/09/2016 1437   APPEARANCEUR CLEAR 06/09/2016 1437   LABSPEC 1.001 (L) 06/09/2016 1437   PHURINE 6.0 06/09/2016 1437   GLUCOSEU NEGATIVE 06/09/2016 1437   HGBUR NEGATIVE 06/09/2016 1437   HGBUR negative 09/01/2007 1133   BILIRUBINUR NEGATIVE 06/09/2016 1437   KETONESUR NEGATIVE 06/09/2016 1437   PROTEINUR NEGATIVE 06/09/2016 1437   UROBILINOGEN 0.2 02/18/2014 1744   NITRITE NEGATIVE 06/09/2016 1437   LEUKOCYTESUR NEGATIVE 06/09/2016 1437   No results found for this or any previous visit (from the past 240 hour(s)).    Radiology Studies: Dg Chest 2 View  Result Date: 06/09/2016 CLINICAL DATA:  Chest pain along with SOB and weakness today - has been battling a sinus infection for 3 weeks now - hx of smoker, htn, diabetes, asthma, GERD, sleep apnea EXAM: CHEST  2 VIEW COMPARISON:  03/20/2015 FINDINGS: Cardiac silhouette is normal in size and configuration. No mediastinal or hilar masses or evidence of adenopathy. There is mild subsegmental atelectasis at the left lateral lung base. Mildly prominent bronchovascular markings are noted bilaterally. There is a vague focal opacity in the right upper lobe near the right apex that may reflect superimposed bronchovascular structures are small nodule. There is no evidence of pneumonia or pulmonary edema. No pleural effusion or pneumothorax. Skeletal structures are demineralized but intact. IMPRESSION: 1. No acute cardiopulmonary disease. 2. Possible small right upper lobe nodule. Recommend nonemergent followup chest CT without contrast.  Electronically Signed   By: Amie Portland M.D.   On: 06/09/2016 15:37    Scheduled Meds: . ALPRAZolam      . aspirin EC  81 mg Oral Daily  . atorvastatin  10 mg Oral q1800  . doxycycline  100 mg Oral Q12H  . ipratropium  0.5 mg Nebulization QID  . loratadine  10 mg Oral Daily  . metoprolol tartrate  12.5 mg Oral BID  . mometasone-formoterol  2 puff Inhalation BID  . nicotine  21 mg Transdermal Daily  . nitroGLYCERIN  0.5 inch Topical Q8H  . pantoprazole  20 mg Oral Daily  . predniSONE  40 mg Oral Q breakfast  . sertraline  200 mg Oral QHS   Continuous Infusions: . heparin 1,000 Units/hr (06/09/16 2114)     LOS: 0 days   Time spent: 25 minutes.  Hazeline Junker, MD [facility] Pager [phone]  If 7PM-7AM, please contact night-coverage [email or web address]  Password [email or web address] 06/10/2016, 11:49 AM

## 2016-06-10 NOTE — Progress Notes (Signed)
D/c instructions discussed with pt, and pt verbalized understanding.  

## 2016-06-10 NOTE — Progress Notes (Signed)
ANTICOAGULATION CONSULT NOTE  Pharmacy Consult for heparin Indication: chest pain/ACS  Allergies  Allergen Reactions  . Morphine And Related Anaphylaxis and Hives    Causes VERY BAD ANXIETY  . Other Other (See Comments)    Black mold - Causes lungs to collapse   PLEASE BE AWARE THAT PT IS A RECOVERING ALCOHOLIC AND DRUG ADDICT AND WOULD RATHER NOT USE PAIN MEDICATION ON CONTINUOUS BASIS  AFTER LEAVING THE HOSPITAL  . Tape Itching and Rash    Please use "paper" tape  Bandages are worse    Patient Measurements: Height: 5\' 5"  (165.1 cm) Weight: 225 lb 14.4 oz (102.5 kg) IBW/kg (Calculated) : 57 Heparin Dosing Weight: 79.9 kg  Vital Signs: Temp: 97.6 F (36.4 C) (05/16 2351) Temp Source: Oral (05/16 2351) BP: 128/78 (05/16 2351) Pulse Rate: 69 (05/16 2351)  Labs:  Recent Labs  06/09/16 1432 06/09/16 2025 06/09/16 2315 06/10/16 0325  HGB 12.8  --   --  11.5*  HCT 39.8  --   --  35.0*  PLT 254  --   --  225  HEPARINUNFRC  --   --   --  0.34  CREATININE 0.71  --   --   --   TROPONINI  --  <0.03 <0.03  --     Estimated Creatinine Clearance: 93.2 mL/min (by C-G formula based on SCr of 0.71 mg/dL).  Assessment: 56 yo Female with chest pain for heparin  Goal of Therapy:  Heparin level 0.3-0.7 units/ml Monitor platelets by anticoagulation protocol: Yes   Plan:  Continue Heparin at current rate   Geannie RisenGreg Sheretha Shadd, PharmD, BCPS  06/10/2016 4:25 AM

## 2016-06-10 NOTE — Discharge Summary (Signed)
Physician Discharge Summary  Meghan Bowman:[id number] DOB: March 07, 1960 DOA: 06/09/2016  PCP: Fleet Contras, MD  Admit date: 06/09/2016 Discharge date: 06/10/2016  Admitted From: Home Disposition: Home   Recommendations for Outpatient Follow-up:  1. Follow up with PCP in 1-2 weeks 2. Follow up with [facility], consider PFTs. Treated for COPD exacerbation in pt with a history of tobacco use.  3. Continue smoking cessation efforts, discharged with low dose nicotine patch as pt is interested in quitting.  Home Health: None Equipment/Devices: None Discharge Condition: Stable CODE STATUS: Full Diet recommendation: Heart healthy  Brief/Interim Summary: Meghan Bowman a 56 y.o.femalewith a history of nonobstructive CAD, tobacco use, possible allergic asthma, HTN, HLD, GERD and alcohol abuse in remission who presented 5/16 for moderate retrosternal pleuritic chest pain/"heaviness" and wheezing. ECG showed NSR and minor ST-T wave changes anteriorly, troponins were negative during observation, and Dr. Sharyn Lull was consulted. Stress test was negative. The patient reports improved symptoms and will be discharged in stable condition, having ruled out for myocardial ischemia.  Discharge Diagnoses:  Active Problems:   Tobacco abuse   Chest pain   HTN (hypertension)   COPD with acute exacerbation (HCC)   CAD (coronary artery disease)  Chest pain: History of nonobstructive cath in 2017 (January 2017 distal mild to moderate LAD stenosis). Enzymes negative x5. D-dimer negative. UDS negative. - ASA, heparin gtt, beta blocker - Stress test 5/17 negative per Dr. Sharyn Lull - LDL 75, continue statin  HTN:  - Continue home medications  COPD with mild exacerbation: Wheezing resolved on exam this AM. - Continue home symbicort - Will plan steroid taper, finish doxycycline course, follow up with pulmonology for consideration of PFTs.  - Bronchodilators  Tobacco use: Pt wants to quit -  Nicotine patch to be ordered at discharge - Follow up cessation efforts as outpatient  Discharge Instructions Discharge Instructions    Diet - low sodium heart healthy    Complete by:  As directed    Discharge instructions    Complete by:  As directed    You were admitted with chest pain. The cause of your chest pain is thought to be musculoskeletal and will improve on its own over time. You can take ibuprofen as needed for pain and follow up with your PCP for ongoing care. The stress test was low risk, so the cause of your symptoms is not from coronary artery disease. It is possible that COPD was causing the wheezing which has improved, so you will continue treatment as below, and schedule follow up with your pulmonologist to have pulmonary function tests performed.  - Continue taking doxycycline twice daily for the next 6 days - Continue taking prednisone for the next 3 days - Continue using symbicort  - If your symptoms worsen, seek medical attention right away.   Increase activity slowly    Complete by:  As directed      Allergies as of 06/10/2016      Reactions   Morphine And Related Anaphylaxis, Hives   Causes VERY BAD ANXIETY   Other Other (See Comments)   Black mold - Causes lungs to collapse  PLEASE BE AWARE THAT PT IS A RECOVERING ALCOHOLIC AND DRUG ADDICT AND WOULD RATHER NOT USE PAIN MEDICATION ON CONTINUOUS BASIS  AFTER LEAVING THE HOSPITAL   Tape Itching, Rash   Please use "paper" tape  Bandages are worse      Medication List    TAKE these medications   amLODipine 5 MG tablet  Commonly known as:  NORVASC Take 5 mg by mouth daily.   aspirin 81 MG tablet Take 81 mg by mouth daily.   atorvastatin 10 MG tablet Commonly known as:  LIPITOR Take 10 mg by mouth daily.   budesonide-formoterol 80-4.5 MCG/ACT inhaler Commonly known as:  SYMBICORT Inhale 2 puffs into the lungs 2 (two) times daily as needed (wheezing).   cetirizine 10 MG tablet Commonly known as:   ZYRTEC Take 10 mg by mouth daily.   doxycycline 100 MG tablet Commonly known as:  VIBRA-TABS Take 1 tablet (100 mg total) by mouth every 12 (twelve) hours.   EPINEPHrine 0.3 mg/0.3 mL Soaj injection Commonly known as:  EPI-PEN Inject 0.3 mg into the muscle as needed (for emergencies).   ibuprofen 200 MG tablet Commonly known as:  ADVIL,MOTRIN Take 200-800 mg by mouth every 6 (six) hours as needed (pain or headaches).   metoprolol succinate 25 MG 24 hr tablet Commonly known as:  TOPROL-XL Take 25 mg by mouth daily.   nicotine 7 mg/24hr patch Commonly known as:  NICODERM CQ - dosed in mg/24 hr Place 1 patch (7 mg total) onto the skin daily. Start taking on:  06/11/2016   nitroGLYCERIN 0.4 MG SL tablet Commonly known as:  NITROSTAT Place 0.4 mg under the tongue every 5 (five) minutes as needed for chest pain.   pantoprazole 20 MG tablet Commonly known as:  PROTONIX Take 20 mg by mouth daily.   potassium chloride 10 MEQ tablet Commonly known as:  K-DUR Take 1 tablet (10 mEq total) by mouth 3 (three) times daily.   predniSONE 20 MG tablet Commonly known as:  DELTASONE Take 2 tablets (40 mg total) by mouth daily with breakfast. Start taking on:  06/11/2016   PROAIR HFA 108 (90 Base) MCG/ACT inhaler Generic drug:  albuterol Inhale 2 puffs into the lungs every 6 (six) hours as needed for wheezing or shortness of breath.   sertraline 100 MG tablet Commonly known as:  ZOLOFT Take 200 mg by mouth at bedtime.      Follow-up Information    Fleet Contras, MD. Schedule an appointment as soon as possible for a visit.   Specialty:  Internal Medicine Contact information: [address] [place] [address] [phone]          Allergies  Allergen Reactions  . Morphine And Related Anaphylaxis and Hives    Causes VERY BAD ANXIETY  . Other Other (See Comments)    Black mold - Causes lungs to collapse   PLEASE BE AWARE THAT PT IS A RECOVERING ALCOHOLIC AND DRUG  ADDICT AND WOULD RATHER NOT USE PAIN MEDICATION ON CONTINUOUS BASIS  AFTER LEAVING THE HOSPITAL  . Tape Itching and Rash    Please use "paper" tape  Bandages are worse    Consultations:  Cardiology, Dr. Sharyn Lull  Procedures/Studies: Dg Chest 2 View  Result Date: 06/09/2016 CLINICAL DATA:  Chest pain along with SOB and weakness today - has been battling a sinus infection for 3 weeks now - hx of smoker, htn, diabetes, asthma, GERD, sleep apnea EXAM: CHEST  2 VIEW COMPARISON:  03/20/2015 FINDINGS: Cardiac silhouette is normal in size and configuration. No mediastinal or hilar masses or evidence of adenopathy. There is mild subsegmental atelectasis at the left lateral lung base. Mildly prominent bronchovascular markings are noted bilaterally. There is a vague focal opacity in the right upper lobe near the right apex that may reflect superimposed bronchovascular structures are small nodule. There is no evidence  of pneumonia or pulmonary edema. No pleural effusion or pneumothorax. Skeletal structures are demineralized but intact. IMPRESSION: 1. No acute cardiopulmonary disease. 2. Possible small right upper lobe nodule. Recommend nonemergent followup chest CT without contrast. Electronically Signed   By: Amie Portlandavid  Ormond M.D.   On: 06/09/2016 15:37   Nm Myocar Multi W/spect W/wall Motion / Ef  Result Date: 06/10/2016 CLINICAL DATA:  Chest pain. Smoking. Ethanol use. Hypertension. COPD. EXAM: MYOCARDIAL IMAGING WITH SPECT (REST AND PHARMACOLOGIC-STRESS) GATED LEFT VENTRICULAR WALL MOTION STUDY LEFT VENTRICULAR EJECTION FRACTION TECHNIQUE: Standard myocardial SPECT imaging was performed after resting intravenous injection of 10 mCi Tc-4340m tetrofosmin. Subsequently, intravenous infusion of Lexiscan was performed under the supervision of the Cardiology staff. At peak effect of the drug, 30 mCi Tc-5340m tetrofosmin was injected intravenously and standard myocardial SPECT imaging was performed. Quantitative gated  imaging was also performed to evaluate left ventricular wall motion, and estimate left ventricular ejection fraction. COMPARISON:  Chest radiograph of 06/09/2016. FINDINGS: Perfusion: No decreased activity in the left ventricle on stress imaging to suggest reversible ischemia or infarction. Wall Motion: Normal left ventricular wall motion. No left ventricular dilation. Left Ventricular Ejection Fraction: 76 % End diastolic volume 68 ml End systolic volume 16 ml IMPRESSION: 1. No reversible ischemia or infarction. 2. Normal left ventricular wall motion. 3. Left ventricular ejection fraction 76% 4. Non invasive risk stratification*: Low *2012 Appropriate Use Criteria for Coronary Revascularization Focused Update: J Am Coll Cardiol. 2012;59(9):857-881. http://content.dementiazones.comonlinejacc.org/article.aspx?articleid=1201161 Electronically Signed   By: Jeronimo GreavesKyle  Talbot M.D.   On: 06/10/2016 15:45    Subjective: Wheezing resolved overnight. No anginal chest pain, though has some discomfort in chest when moving arms. No dyspnea.  Discharge Exam: Vitals:   06/10/16 1133 06/10/16 1311  BP: 119/87 137/85  Pulse:  94  Resp:  19  Temp:  97.8 F (36.6 C)   General: 55yo F in no distress Cardiovascular: RRR, S1/S2 +, no rubs, no gallops. Tender to palpation along parasternal border. Respiratory: CTA bilaterally, no wheezing, no rhonchi Abdominal: Soft, NT, ND, bowel sounds + Extremities: No edema, no cyanosis  The results of significant diagnostics from this hospitalization (including imaging, microbiology, ancillary and laboratory) are listed below for reference.    Labs: BNP (last 3 results) No results for input(s): BNP in the last 8760 hours. Basic Metabolic Panel:  Recent Labs Lab 06/09/16 1432  NA 138  K 3.7  CL 107  CO2 22  GLUCOSE 132*  BUN <5*  CREATININE 0.71  CALCIUM 9.2   Liver Function Tests: No results for input(s): AST, ALT, ALKPHOS, BILITOT, PROT, ALBUMIN in the last 168 hours.  Recent  Labs Lab 06/09/16 2315  LIPASE 39   No results for input(s): AMMONIA in the last 168 hours. CBC:  Recent Labs Lab 06/09/16 1432 06/10/16 0325  WBC 8.6 8.8  HGB 12.8 11.5*  HCT 39.8 35.0*  MCV 91.3 90.4  PLT 254 225   Cardiac Enzymes:  Recent Labs Lab 06/09/16 2025 06/09/16 2315 06/10/16 0325 06/10/16 0942  TROPONINI <0.03 <0.03 <0.03 <0.03   BNP: Invalid input(s): POCBNP CBG: No results for input(s): GLUCAP in the last 168 hours. D-Dimer  Recent Labs  06/09/16 2112  DDIMER 0.49   Hgb A1c No results for input(s): HGBA1C in the last 72 hours. Lipid Profile  Recent Labs  06/10/16 0325  CHOL 134  HDL 49  LDLCALC 75  TRIG 52  CHOLHDL 2.7   Thyroid function studies No results for input(s): TSH, T4TOTAL, T3FREE, THYROIDAB in  the last 72 hours.  Invalid input(s): FREET3 Anemia work up No results for input(s): VITAMINB12, FOLATE, FERRITIN, TIBC, IRON, RETICCTPCT in the last 72 hours. Urinalysis    Component Value Date/Time   COLORURINE COLORLESS (A) 06/09/2016 1437   APPEARANCEUR CLEAR 06/09/2016 1437   LABSPEC 1.001 (L) 06/09/2016 1437   PHURINE 6.0 06/09/2016 1437   GLUCOSEU NEGATIVE 06/09/2016 1437   HGBUR NEGATIVE 06/09/2016 1437   HGBUR negative 09/01/2007 1133   BILIRUBINUR NEGATIVE 06/09/2016 1437   KETONESUR NEGATIVE 06/09/2016 1437   PROTEINUR NEGATIVE 06/09/2016 1437   UROBILINOGEN 0.2 02/18/2014 1744   NITRITE NEGATIVE 06/09/2016 1437   LEUKOCYTESUR NEGATIVE 06/09/2016 1437    Microbiology No results found for this or any previous visit (from the past 240 hour(s)).  Time coordinating discharge: Approximately 40 minutes  Hazeline Junker, MD  [facility] 06/10/2016, 4:11 PM Pager [phone]

## 2016-06-24 ENCOUNTER — Institutional Professional Consult (permissible substitution): Payer: Self-pay | Admitting: Pulmonary Disease

## 2016-09-09 ENCOUNTER — Other Ambulatory Visit: Payer: Self-pay | Admitting: Internal Medicine

## 2016-09-09 DIAGNOSIS — Z1231 Encounter for screening mammogram for malignant neoplasm of breast: Secondary | ICD-10-CM

## 2016-09-09 DIAGNOSIS — E2839 Other primary ovarian failure: Secondary | ICD-10-CM

## 2016-09-28 ENCOUNTER — Other Ambulatory Visit: Payer: Self-pay

## 2016-09-28 ENCOUNTER — Ambulatory Visit: Payer: Self-pay

## 2016-10-04 ENCOUNTER — Ambulatory Visit
Admission: RE | Admit: 2016-10-04 | Discharge: 2016-10-04 | Disposition: A | Discharge status: Home / Self Care | Payer: Medicaid Other | Source: Ambulatory Visit | Attending: Internal Medicine | Admitting: Internal Medicine

## 2016-10-04 DIAGNOSIS — E2839 Other primary ovarian failure: Secondary | ICD-10-CM

## 2016-10-04 DIAGNOSIS — Z1231 Encounter for screening mammogram for malignant neoplasm of breast: Secondary | ICD-10-CM

## 2016-10-06 ENCOUNTER — Other Ambulatory Visit: Payer: Self-pay | Admitting: Internal Medicine

## 2016-10-06 DIAGNOSIS — R928 Other abnormal and inconclusive findings on diagnostic imaging of breast: Secondary | ICD-10-CM

## 2016-10-13 ENCOUNTER — Other Ambulatory Visit: Payer: Self-pay | Admitting: Internal Medicine

## 2016-10-13 ENCOUNTER — Ambulatory Visit
Admission: RE | Admit: 2016-10-13 | Discharge: 2016-10-13 | Disposition: A | Discharge status: Home / Self Care | Payer: Medicaid Other | Source: Ambulatory Visit | Attending: Internal Medicine | Admitting: Internal Medicine

## 2016-10-13 DIAGNOSIS — R928 Other abnormal and inconclusive findings on diagnostic imaging of breast: Secondary | ICD-10-CM

## 2016-10-14 ENCOUNTER — Ambulatory Visit
Admission: RE | Admit: 2016-10-14 | Discharge: 2016-10-14 | Disposition: A | Discharge status: Home / Self Care | Payer: Medicaid Other | Source: Ambulatory Visit | Attending: Internal Medicine | Admitting: Internal Medicine

## 2016-10-14 ENCOUNTER — Other Ambulatory Visit: Payer: Self-pay | Admitting: Internal Medicine

## 2016-10-14 DIAGNOSIS — R928 Other abnormal and inconclusive findings on diagnostic imaging of breast: Secondary | ICD-10-CM

## 2017-02-06 ENCOUNTER — Other Ambulatory Visit: Payer: Self-pay

## 2017-02-06 ENCOUNTER — Emergency Department (HOSPITAL_COMMUNITY)
Admission: EM | Admit: 2017-02-06 | Discharge: 2017-02-06 | Disposition: A | Discharge status: Home / Self Care | Payer: Medicaid Other | Attending: Emergency Medicine | Admitting: Emergency Medicine

## 2017-02-06 ENCOUNTER — Encounter (HOSPITAL_COMMUNITY): Payer: Self-pay | Admitting: *Deleted

## 2017-02-06 DIAGNOSIS — R42 Dizziness and giddiness: Secondary | ICD-10-CM | POA: Diagnosis not present

## 2017-02-06 DIAGNOSIS — F17210 Nicotine dependence, cigarettes, uncomplicated: Secondary | ICD-10-CM | POA: Diagnosis not present

## 2017-02-06 DIAGNOSIS — R51 Headache: Secondary | ICD-10-CM | POA: Insufficient documentation

## 2017-02-06 DIAGNOSIS — J45909 Unspecified asthma, uncomplicated: Secondary | ICD-10-CM | POA: Diagnosis not present

## 2017-02-06 DIAGNOSIS — Z7982 Long term (current) use of aspirin: Secondary | ICD-10-CM | POA: Diagnosis not present

## 2017-02-06 DIAGNOSIS — J019 Acute sinusitis, unspecified: Secondary | ICD-10-CM | POA: Insufficient documentation

## 2017-02-06 DIAGNOSIS — Z79899 Other long term (current) drug therapy: Secondary | ICD-10-CM | POA: Insufficient documentation

## 2017-02-06 DIAGNOSIS — I2510 Atherosclerotic heart disease of native coronary artery without angina pectoris: Secondary | ICD-10-CM | POA: Insufficient documentation

## 2017-02-06 DIAGNOSIS — Z96652 Presence of left artificial knee joint: Secondary | ICD-10-CM | POA: Diagnosis not present

## 2017-02-06 DIAGNOSIS — I10 Essential (primary) hypertension: Secondary | ICD-10-CM | POA: Diagnosis not present

## 2017-02-06 DIAGNOSIS — R110 Nausea: Secondary | ICD-10-CM

## 2017-02-06 DIAGNOSIS — J449 Chronic obstructive pulmonary disease, unspecified: Secondary | ICD-10-CM | POA: Diagnosis not present

## 2017-02-06 DIAGNOSIS — R519 Headache, unspecified: Secondary | ICD-10-CM

## 2017-02-06 MED ORDER — KETOROLAC TROMETHAMINE 30 MG/ML IJ SOLN
30.0000 mg | Freq: Once | INTRAMUSCULAR | Status: AC
  Administered 2017-02-06: 30 mg via INTRAVENOUS
  Filled 2017-02-06: qty 1

## 2017-02-06 MED ORDER — DIPHENHYDRAMINE HCL 50 MG/ML IJ SOLN
25.0000 mg | Freq: Once | INTRAMUSCULAR | Status: AC
  Administered 2017-02-06: 25 mg via INTRAVENOUS
  Filled 2017-02-06: qty 1

## 2017-02-06 MED ORDER — NAPROXEN 500 MG PO TABS: 500.0000 mg | ORAL_TABLET | Freq: Two times a day (BID) | ORAL | 0 refills | Status: DC | PRN

## 2017-02-06 MED ORDER — METOCLOPRAMIDE HCL 10 MG PO TABS: 10.0000 mg | ORAL_TABLET | Freq: Four times a day (QID) | ORAL | 0 refills | Status: DC | PRN

## 2017-02-06 MED ORDER — DEXAMETHASONE SODIUM PHOSPHATE 10 MG/ML IJ SOLN
10.0000 mg | Freq: Once | INTRAMUSCULAR | Status: AC
  Administered 2017-02-06: 10 mg via INTRAVENOUS
  Filled 2017-02-06: qty 1

## 2017-02-06 MED ORDER — SODIUM CHLORIDE 0.9 % IV BOLUS (SEPSIS)
1000.0000 mL | Freq: Once | INTRAVENOUS | Status: AC
  Administered 2017-02-06: 1000 mL via INTRAVENOUS

## 2017-02-06 MED ORDER — METOCLOPRAMIDE HCL 5 MG/ML IJ SOLN
10.0000 mg | Freq: Once | INTRAMUSCULAR | Status: AC
  Administered 2017-02-06: 10 mg via INTRAVENOUS
  Filled 2017-02-06: qty 2
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
## 2017-02-06 NOTE — Discharge Instructions (Signed)
Continue to stay well-hydrated. Continue to alternate between Tylenol and naprosyn for pain or fever. Use reglan as directed as needed for nausea and headaches. Use netipot and flonase to help with nasal congestion. May consider over-the-counter Benadryl or other antihistamine to decrease secretions and for help with your symptoms. You may also use all of your usual home medications as well. Follow up with your primary care doctor in 5-7 days for recheck of ongoing symptoms. Return to emergency department for emergent changing or worsening of symptoms.

## 2017-02-06 NOTE — ED Triage Notes (Signed)
Pt states she has had a headache for 7 days, she is finishing antibiotics for sinus infection, prescribed meds for headache with no relief, reports nausea and dizziness with headache

## 2017-05-12 ENCOUNTER — Emergency Department (HOSPITAL_COMMUNITY): Payer: Medicaid Other

## 2017-05-12 ENCOUNTER — Encounter (HOSPITAL_COMMUNITY): Payer: Self-pay

## 2017-05-12 ENCOUNTER — Emergency Department (HOSPITAL_COMMUNITY)
Admission: EM | Admit: 2017-05-12 | Discharge: 2017-05-13 | Disposition: A | Discharge status: Home / Self Care | Payer: Medicaid Other | Attending: Emergency Medicine | Admitting: Emergency Medicine

## 2017-05-12 ENCOUNTER — Other Ambulatory Visit: Payer: Self-pay

## 2017-05-12 DIAGNOSIS — I2510 Atherosclerotic heart disease of native coronary artery without angina pectoris: Secondary | ICD-10-CM | POA: Diagnosis not present

## 2017-05-12 DIAGNOSIS — F17210 Nicotine dependence, cigarettes, uncomplicated: Secondary | ICD-10-CM | POA: Diagnosis not present

## 2017-05-12 DIAGNOSIS — Z96652 Presence of left artificial knee joint: Secondary | ICD-10-CM | POA: Insufficient documentation

## 2017-05-12 DIAGNOSIS — J45909 Unspecified asthma, uncomplicated: Secondary | ICD-10-CM | POA: Insufficient documentation

## 2017-05-12 DIAGNOSIS — R109 Unspecified abdominal pain: Secondary | ICD-10-CM | POA: Insufficient documentation

## 2017-05-12 DIAGNOSIS — Z7982 Long term (current) use of aspirin: Secondary | ICD-10-CM | POA: Diagnosis not present

## 2017-05-12 DIAGNOSIS — I10 Essential (primary) hypertension: Secondary | ICD-10-CM | POA: Diagnosis not present

## 2017-05-12 DIAGNOSIS — Z79899 Other long term (current) drug therapy: Secondary | ICD-10-CM | POA: Diagnosis not present

## 2017-05-12 LAB — URINALYSIS, ROUTINE W REFLEX MICROSCOPIC
Glucose, UA: NEGATIVE mg/dL
Hgb urine dipstick: NEGATIVE
Ketones, ur: 5 mg/dL — AB
Nitrite: NEGATIVE
Protein, ur: 30 mg/dL — AB
Specific Gravity, Urine: 1.032 — ABNORMAL HIGH (ref 1.005–1.030)
pH: 5 (ref 5.0–8.0)

## 2017-05-12 LAB — BASIC METABOLIC PANEL
Anion gap: 12 (ref 5–15)
BUN: 6 mg/dL (ref 6–20)
CO2: 21 mmol/L — ABNORMAL LOW (ref 22–32)
Calcium: 9.2 mg/dL (ref 8.9–10.3)
Chloride: 106 mmol/L (ref 101–111)
Creatinine, Ser: 0.94 mg/dL (ref 0.44–1.00)
GFR calc Af Amer: >60 mL/min (ref >60)
GFR calc non Af Amer: >60 mL/min (ref >60)
Glucose, Bld: 105 mg/dL — ABNORMAL HIGH (ref 65–99)
Potassium: 4 mmol/L (ref 3.5–5.1)
Sodium: 139 mmol/L (ref 135–145)

## 2017-05-12 LAB — CBC WITH DIFFERENTIAL/PLATELET
Basophils Absolute: 0 10*3/uL (ref 0.0–0.1)
Basophils Relative: 0 %
Eosinophils Absolute: 0.3 10*3/uL (ref 0.0–0.7)
Eosinophils Relative: 3 %
HCT: 39.8 % (ref 36.0–46.0)
Hemoglobin: 13.2 g/dL (ref 12.0–15.0)
Lymphocytes Relative: 30 %
Lymphs Abs: 2.9 10*3/uL (ref 0.7–4.0)
MCH: 30 pg (ref 26.0–34.0)
MCHC: 33.2 g/dL (ref 30.0–36.0)
MCV: 90.5 fL (ref 78.0–100.0)
Monocytes Absolute: 0.6 10*3/uL (ref 0.1–1.0)
Monocytes Relative: 6 %
Neutro Abs: 5.8 10*3/uL (ref 1.7–7.7)
Neutrophils Relative %: 61 %
Platelets: 297 10*3/uL (ref 150–400)
RBC: 4.4 MIL/uL (ref 3.87–5.11)
RDW: 13 % (ref 11.5–15.5)
WBC: 9.6 10*3/uL (ref 4.0–10.5)

## 2017-05-12 MED ORDER — HYDROMORPHONE HCL 2 MG/ML IJ SOLN: 0.5000 mg | Freq: Once | INTRAMUSCULAR | Status: DC

## 2017-05-12 MED ORDER — ONDANSETRON 4 MG PO TBDP
4.0000 mg | ORAL_TABLET | Freq: Once | ORAL | Status: AC
  Administered 2017-05-12: 4 mg via ORAL
  Filled 2017-05-12: qty 1

## 2017-05-12 MED ORDER — FENTANYL CITRATE (PF) 100 MCG/2ML IJ SOLN
12.5000 ug | Freq: Once | INTRAMUSCULAR | Status: AC
  Administered 2017-05-12: 12.5 ug via INTRAVENOUS
  Filled 2017-05-12: qty 2

## 2017-05-12 NOTE — ED Provider Notes (Signed)
Patient placed in Quick Look pathway, seen and evaluated.   Chief Complaint: L flank pain  HPI:   Patient reports acute onset left flank pain that has progressively worsened since 2:30 PM earlier today.  Associated nausea.  She took 1 dose of Aleve with no improvement in her symptoms.  No prior history of similar symptoms.  Denies any injuries, falls or trauma to the area.  Denies any dysuria, hematuria, history of kidney stones, fever, vomiting.  ROS: Left flank pain  Physical Exam:   Gen: Appears uncomfortable  Neuro: Awake and Alert  Skin: Warm    Focused Exam: Left-sided CVA tenderness and flank pain.  No abdominal tenderness to palpation, rebound or guarding present.   Initiation of care has begun. The patient has been counseled on the process, plan, and necessity for staying for the completion/evaluation, and the remainder of the medical screening examination.    Dietrich PatesKhatri, Anitha Kreiser, PA-C 05/12/17 2022    Raeford RazorKohut, Stephen, MD 05/14/17 1006

## 2017-05-12 NOTE — ED Triage Notes (Signed)
Called x's [phone] without response 

## 2017-05-12 NOTE — ED Triage Notes (Signed)
Pt presents with onset of L flank pain that began today.  Pt denies any injury; pt denies any dysuria or dark colored urine.

## 2017-05-13 LAB — HEPATIC FUNCTION PANEL
ALT: 13 U/L — AB (ref 14–54)
AST: 20 U/L (ref 15–41)
Albumin: 4.3 g/dL (ref 3.5–5.0)
Alkaline Phosphatase: 83 U/L (ref 38–126)
BILIRUBIN TOTAL: 0.5 mg/dL (ref 0.3–1.2)
Total Protein: 7 g/dL (ref 6.5–8.1)

## 2017-05-13 MED ORDER — LIDOCAINE 5 % EX PTCH: 1.0000 | MEDICATED_PATCH | CUTANEOUS | 0 refills | Status: DC

## 2017-05-13 MED ORDER — SODIUM CHLORIDE 0.9 % IV SOLN
1.0000 g | Freq: Once | INTRAVENOUS | Status: AC
  Administered 2017-05-13: 1 g via INTRAVENOUS
  Filled 2017-05-13: qty 10

## 2017-05-13 MED ORDER — CEPHALEXIN 500 MG PO CAPS: 500.0000 mg | ORAL_CAPSULE | Freq: Four times a day (QID) | ORAL | 0 refills | Status: DC

## 2017-05-13 MED ORDER — NAPROXEN 375 MG PO TABS: 375.0000 mg | ORAL_TABLET | Freq: Two times a day (BID) | ORAL | 0 refills | Status: DC

## 2017-05-13 MED ORDER — KETOROLAC TROMETHAMINE 30 MG/ML IJ SOLN
15.0000 mg | Freq: Once | INTRAMUSCULAR | Status: AC
  Administered 2017-05-13: 15 mg via INTRAVENOUS
  Filled 2017-05-13: qty 1

## 2017-05-13 MED ORDER — LIDOCAINE 5 % EX PTCH
1.0000 | MEDICATED_PATCH | Freq: Once | CUTANEOUS | Status: DC
  Administered 2017-05-13: 1 via TRANSDERMAL
  Filled 2017-05-13: qty 1

## 2017-05-13 MED ORDER — HYDROCODONE-ACETAMINOPHEN 5-325 MG PO TABS
1.0000 | ORAL_TABLET | Freq: Once | ORAL | Status: AC
  Administered 2017-05-13: 1 via ORAL
  Filled 2017-05-13: qty 1

## 2017-05-13 MED ORDER — CYCLOBENZAPRINE HCL 10 MG PO TABS: 10.0000 mg | ORAL_TABLET | Freq: Three times a day (TID) | ORAL | 0 refills | Status: DC | PRN
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
## 2017-05-13 NOTE — Discharge Instructions (Addendum)
Your pain may be related to a kidney infection versus musculoskeletal pain.  We will treat you with antibiotics to cover for urinary tract or kidney infection. Please take the Naproxen as prescribed for pain. Do not take any additional NSAIDs including Motrin, Aleve, Ibuprofen, Advil. Please the the flexeril for muscle relaxation. This medication will make you drowsy so avoid situation that could place you in danger.  May apply lidocaine patch to help with pain.  Warm compresses to the affected area.  Follow-up with your primary care doctor in 2-3 days and return to ED with any worsening symptoms.

## 2017-05-14 LAB — URINE CULTURE

## 2017-07-08 ENCOUNTER — Encounter (HOSPITAL_COMMUNITY): Payer: Self-pay

## 2017-07-08 ENCOUNTER — Emergency Department (HOSPITAL_COMMUNITY): Payer: Medicaid Other

## 2017-07-08 ENCOUNTER — Emergency Department (HOSPITAL_COMMUNITY)
Admission: EM | Admit: 2017-07-08 | Discharge: 2017-07-08 | Disposition: A | Discharge status: Home / Self Care | Payer: Medicaid Other | Attending: Emergency Medicine | Admitting: Emergency Medicine

## 2017-07-08 ENCOUNTER — Other Ambulatory Visit: Payer: Self-pay

## 2017-07-08 DIAGNOSIS — Y998 Other external cause status: Secondary | ICD-10-CM | POA: Diagnosis not present

## 2017-07-08 DIAGNOSIS — I2510 Atherosclerotic heart disease of native coronary artery without angina pectoris: Secondary | ICD-10-CM | POA: Diagnosis not present

## 2017-07-08 DIAGNOSIS — Y929 Unspecified place or not applicable: Secondary | ICD-10-CM | POA: Diagnosis not present

## 2017-07-08 DIAGNOSIS — S99929A Unspecified injury of unspecified foot, initial encounter: Secondary | ICD-10-CM

## 2017-07-08 DIAGNOSIS — F17210 Nicotine dependence, cigarettes, uncomplicated: Secondary | ICD-10-CM | POA: Insufficient documentation

## 2017-07-08 DIAGNOSIS — Y9301 Activity, walking, marching and hiking: Secondary | ICD-10-CM | POA: Diagnosis not present

## 2017-07-08 DIAGNOSIS — Z79899 Other long term (current) drug therapy: Secondary | ICD-10-CM | POA: Diagnosis not present

## 2017-07-08 DIAGNOSIS — I10 Essential (primary) hypertension: Secondary | ICD-10-CM | POA: Diagnosis not present

## 2017-07-08 DIAGNOSIS — W228XXA Striking against or struck by other objects, initial encounter: Secondary | ICD-10-CM | POA: Diagnosis not present

## 2017-07-08 DIAGNOSIS — S99921A Unspecified injury of right foot, initial encounter: Secondary | ICD-10-CM | POA: Diagnosis present

## 2017-07-08 DIAGNOSIS — J4520 Mild intermittent asthma, uncomplicated: Secondary | ICD-10-CM | POA: Insufficient documentation

## 2017-07-08 DIAGNOSIS — Z7982 Long term (current) use of aspirin: Secondary | ICD-10-CM | POA: Diagnosis not present

## 2017-07-08 MED ORDER — IPRATROPIUM-ALBUTEROL 0.5-2.5 (3) MG/3ML IN SOLN
3.0000 mL | Freq: Once | RESPIRATORY_TRACT | Status: AC
  Administered 2017-07-08: 3 mL via RESPIRATORY_TRACT
  Filled 2017-07-08: qty 3

## 2017-07-08 MED ORDER — HYDROCODONE-ACETAMINOPHEN 5-325 MG PO TABS
1.0000 | ORAL_TABLET | Freq: Once | ORAL | Status: AC
  Administered 2017-07-08: 1 via ORAL
  Filled 2017-07-08: qty 1

## 2017-07-08 MED ORDER — HYDROCODONE-ACETAMINOPHEN 5-325 MG PO TABS: 2.0000 | ORAL_TABLET | ORAL | 0 refills | Status: DC | PRN
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
## 2017-07-08 NOTE — ED Triage Notes (Signed)
Patient reports that she jammed her right great toe into a table. And the nail hs been pushed back. Slight bleeding noted. Patient also c/o expiratory wheezing. Patient states she has used her albuterol inhaler,but is still having expiratory wheezing.

## 2017-07-08 NOTE — Discharge Instructions (Signed)
See your foot doctor next week for recheck

## 2017-07-08 NOTE — ED Notes (Signed)
Pt had a syncope episode, while performing wound care. Provider was made aware and present during loc.

## 2017-09-11 ENCOUNTER — Encounter (HOSPITAL_COMMUNITY): Payer: Self-pay

## 2017-09-11 ENCOUNTER — Other Ambulatory Visit: Payer: Self-pay

## 2017-09-11 ENCOUNTER — Emergency Department (HOSPITAL_COMMUNITY): Payer: Medicaid Other

## 2017-09-11 ENCOUNTER — Emergency Department (HOSPITAL_COMMUNITY)
Admission: EM | Admit: 2017-09-11 | Discharge: 2017-09-11 | Disposition: A | Discharge status: Home / Self Care | Payer: Medicaid Other | Attending: Emergency Medicine | Admitting: Emergency Medicine

## 2017-09-11 DIAGNOSIS — I10 Essential (primary) hypertension: Secondary | ICD-10-CM | POA: Diagnosis not present

## 2017-09-11 DIAGNOSIS — F17210 Nicotine dependence, cigarettes, uncomplicated: Secondary | ICD-10-CM | POA: Insufficient documentation

## 2017-09-11 DIAGNOSIS — J45909 Unspecified asthma, uncomplicated: Secondary | ICD-10-CM | POA: Insufficient documentation

## 2017-09-11 DIAGNOSIS — I2510 Atherosclerotic heart disease of native coronary artery without angina pectoris: Secondary | ICD-10-CM | POA: Insufficient documentation

## 2017-09-11 DIAGNOSIS — F419 Anxiety disorder, unspecified: Secondary | ICD-10-CM | POA: Insufficient documentation

## 2017-09-11 DIAGNOSIS — R0789 Other chest pain: Secondary | ICD-10-CM | POA: Diagnosis not present

## 2017-09-11 DIAGNOSIS — Z96652 Presence of left artificial knee joint: Secondary | ICD-10-CM | POA: Insufficient documentation

## 2017-09-11 DIAGNOSIS — Z79899 Other long term (current) drug therapy: Secondary | ICD-10-CM | POA: Diagnosis not present

## 2017-09-11 DIAGNOSIS — R079 Chest pain, unspecified: Secondary | ICD-10-CM | POA: Diagnosis present

## 2017-09-11 DIAGNOSIS — F329 Major depressive disorder, single episode, unspecified: Secondary | ICD-10-CM | POA: Insufficient documentation

## 2017-09-11 DIAGNOSIS — Z7982 Long term (current) use of aspirin: Secondary | ICD-10-CM | POA: Insufficient documentation

## 2017-09-11 LAB — BASIC METABOLIC PANEL
ANION GAP: 11 (ref 5–15)
BUN: 10 mg/dL (ref 6–20)
CO2: 25 mmol/L (ref 22–32)
Calcium: 8.5 mg/dL — ABNORMAL LOW (ref 8.9–10.3)
Chloride: 107 mmol/L (ref 98–111)
Creatinine, Ser: 0.88 mg/dL (ref 0.44–1.00)
GFR calc Af Amer: >60 mL/min (ref >60)
GFR calc non Af Amer: >60 mL/min (ref >60)
GLUCOSE: 150 mg/dL — AB (ref 70–99)
POTASSIUM: 3 mmol/L — AB (ref 3.5–5.1)
SODIUM: 143 mmol/L (ref 135–145)

## 2017-09-11 LAB — CBC WITH DIFFERENTIAL/PLATELET
BASOS ABS: 0 10*3/uL (ref 0.0–0.1)
Basophils Relative: 0 %
EOS PCT: 0 %
Eosinophils Absolute: 0.1 10*3/uL (ref 0.0–0.7)
HCT: 36.6 % (ref 36.0–46.0)
HEMOGLOBIN: 12.1 g/dL (ref 12.0–15.0)
LYMPHS PCT: 24 %
Lymphs Abs: 4 10*3/uL (ref 0.7–4.0)
MCH: 30.4 pg (ref 26.0–34.0)
MCHC: 33.1 g/dL (ref 30.0–36.0)
MCV: 92 fL (ref 78.0–100.0)
Monocytes Absolute: 1.1 10*3/uL — ABNORMAL HIGH (ref 0.1–1.0)
Monocytes Relative: 7 %
NEUTROS ABS: 11.3 10*3/uL — AB (ref 1.7–7.7)
NEUTROS PCT: 69 %
Platelets: 283 10*3/uL (ref 150–400)
RBC: 3.98 MIL/uL (ref 3.87–5.11)
RDW: 13.6 % (ref 11.5–15.5)
WBC: 16.5 10*3/uL — AB (ref 4.0–10.5)

## 2017-09-11 LAB — TROPONIN I

## 2017-09-11 LAB — D-DIMER, QUANTITATIVE: D-Dimer, Quant: <0.27 ug/mL-FEU (ref 0.00–0.50)

## 2017-09-11 LAB — POCT I-STAT TROPONIN I: TROPONIN I, POC: 0.01 ng/mL (ref 0.00–0.08)

## 2017-09-11 MED ORDER — POTASSIUM CHLORIDE CRYS ER 20 MEQ PO TBCR
40.0000 meq | EXTENDED_RELEASE_TABLET | Freq: Once | ORAL | Status: AC
  Administered 2017-09-11: 40 meq via ORAL
  Filled 2017-09-11: qty 2

## 2017-09-11 MED ORDER — KETOROLAC TROMETHAMINE 15 MG/ML IJ SOLN
15.0000 mg | Freq: Once | INTRAMUSCULAR | Status: AC
  Administered 2017-09-11: 15 mg via INTRAVENOUS
  Filled 2017-09-11: qty 1
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
## 2017-09-11 NOTE — Discharge Instructions (Addendum)
Please return for any problem. Follow up with your regular physician as instructed.  °

## 2017-09-11 NOTE — ED Triage Notes (Signed)
She tells me that, for about 6 weeks she has been dealing with "sinus problems". She states she has taken multiple antibiotics and multiple steroid packs for same and "my sinuses are getting better" she is here today for new mild cough and shortness of breath. She brings two albuterol inhalers with her. She is in no distress. EKS/CXR performed at triage.

## 2017-10-13 ENCOUNTER — Emergency Department (HOSPITAL_COMMUNITY)
Admission: EM | Admit: 2017-10-13 | Discharge: 2017-10-14 | Disposition: A | Discharge status: Home / Self Care | Payer: Medicaid Other | Attending: Emergency Medicine | Admitting: Emergency Medicine

## 2017-10-13 ENCOUNTER — Encounter (HOSPITAL_COMMUNITY): Payer: Self-pay

## 2017-10-13 DIAGNOSIS — I2510 Atherosclerotic heart disease of native coronary artery without angina pectoris: Secondary | ICD-10-CM | POA: Insufficient documentation

## 2017-10-13 DIAGNOSIS — Z79899 Other long term (current) drug therapy: Secondary | ICD-10-CM | POA: Diagnosis not present

## 2017-10-13 DIAGNOSIS — R4781 Slurred speech: Secondary | ICD-10-CM | POA: Diagnosis not present

## 2017-10-13 DIAGNOSIS — I10 Essential (primary) hypertension: Secondary | ICD-10-CM | POA: Diagnosis not present

## 2017-10-13 DIAGNOSIS — H538 Other visual disturbances: Secondary | ICD-10-CM | POA: Insufficient documentation

## 2017-10-13 DIAGNOSIS — J449 Chronic obstructive pulmonary disease, unspecified: Secondary | ICD-10-CM | POA: Diagnosis not present

## 2017-10-13 DIAGNOSIS — F17210 Nicotine dependence, cigarettes, uncomplicated: Secondary | ICD-10-CM | POA: Diagnosis not present

## 2017-10-13 DIAGNOSIS — R531 Weakness: Secondary | ICD-10-CM

## 2017-10-13 NOTE — ED Notes (Signed)
Bed: [id number] Expected date: 10/13/17 Expected time: 11:33 PM Means of arrival:  Comments:

## 2017-10-13 NOTE — ED Triage Notes (Signed)
Pt complains of not feeling well for four months, she states that she's getting worse even after several doctor visits She complains of blurred vision, weak legs and fatigue

## 2017-10-14 ENCOUNTER — Other Ambulatory Visit: Payer: Self-pay

## 2017-10-14 ENCOUNTER — Emergency Department (HOSPITAL_COMMUNITY): Payer: Medicaid Other

## 2017-10-14 LAB — CBC WITH DIFFERENTIAL/PLATELET
BASOS PCT: 0 %
Basophils Absolute: 0 10*3/uL (ref 0.0–0.1)
EOS ABS: 0.2 10*3/uL (ref 0.0–0.7)
Eosinophils Relative: 2 %
HCT: 37.1 % (ref 36.0–46.0)
HEMOGLOBIN: 12.2 g/dL (ref 12.0–15.0)
LYMPHS ABS: 3.2 10*3/uL (ref 0.7–4.0)
Lymphocytes Relative: 32 %
MCH: 30.7 pg (ref 26.0–34.0)
MCHC: 32.9 g/dL (ref 30.0–36.0)
MCV: 93.5 fL (ref 78.0–100.0)
Monocytes Absolute: 0.7 10*3/uL (ref 0.1–1.0)
Monocytes Relative: 7 %
NEUTROS ABS: 6 10*3/uL (ref 1.7–7.7)
NEUTROS PCT: 59 %
Platelets: 256 10*3/uL (ref 150–400)
RBC: 3.97 MIL/uL (ref 3.87–5.11)
RDW: 12.9 % (ref 11.5–15.5)
WBC: 10.1 10*3/uL (ref 4.0–10.5)

## 2017-10-14 LAB — URINALYSIS, ROUTINE W REFLEX MICROSCOPIC
BILIRUBIN URINE: NEGATIVE
Bacteria, UA: NONE SEEN
GLUCOSE, UA: NEGATIVE mg/dL
KETONES UR: NEGATIVE mg/dL
Leukocytes, UA: NEGATIVE
NITRITE: NEGATIVE
PH: 6 (ref 5.0–8.0)
Protein, ur: NEGATIVE mg/dL
Specific Gravity, Urine: 1.009 (ref 1.005–1.030)

## 2017-10-14 LAB — BASIC METABOLIC PANEL
Anion gap: 10 (ref 5–15)
BUN: 6 mg/dL (ref 6–20)
CALCIUM: 9.2 mg/dL (ref 8.9–10.3)
CHLORIDE: 109 mmol/L (ref 98–111)
CO2: 25 mmol/L (ref 22–32)
CREATININE: 0.7 mg/dL (ref 0.44–1.00)
GFR calc non Af Amer: >60 mL/min (ref >60)
Glucose, Bld: 121 mg/dL — ABNORMAL HIGH (ref 70–99)
Potassium: 3.2 mmol/L — ABNORMAL LOW (ref 3.5–5.1)
SODIUM: 144 mmol/L (ref 135–145)

## 2017-10-14 NOTE — Discharge Instructions (Addendum)
Continue medications as previously prescribed.  Up with your primary doctor in the next week if symptoms are not improving.
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
## 2017-11-02 ENCOUNTER — Encounter: Payer: Self-pay | Admitting: Neurology

## 2017-11-02 ENCOUNTER — Ambulatory Visit: Payer: Medicaid Other | Admitting: Neurology

## 2017-11-02 ENCOUNTER — Other Ambulatory Visit: Payer: Self-pay | Admitting: Neurology

## 2017-11-02 VITALS — BP 116/88 | HR 64 | Ht 65.0 in | Wt 221.2 lb

## 2017-11-02 DIAGNOSIS — G43709 Chronic migraine without aura, not intractable, without status migrainosus: Secondary | ICD-10-CM

## 2017-11-02 DIAGNOSIS — IMO0002 Reserved for concepts with insufficient information to code with codable children: Secondary | ICD-10-CM | POA: Insufficient documentation

## 2017-11-02 DIAGNOSIS — G6289 Other specified polyneuropathies: Secondary | ICD-10-CM

## 2017-11-02 MED ORDER — TOPIRAMATE 100 MG PO TABS: 200.0000 mg | ORAL_TABLET | Freq: Two times a day (BID) | ORAL | 11 refills | Status: DC

## 2017-11-02 MED ORDER — SUMATRIPTAN SUCCINATE 25 MG PO TABS: 25.0000 mg | ORAL_TABLET | ORAL | 11 refills | Status: DC | PRN

## 2017-11-02 MED ORDER — ONDANSETRON 4 MG PO TBDP: 4.0000 mg | ORAL_TABLET | Freq: Three times a day (TID) | ORAL | 6 refills | Status: DC | PRN

## 2017-11-02 MED ORDER — TOPIRAMATE 100 MG PO TABS: 200.0000 mg | ORAL_TABLET | Freq: Every day | ORAL | 11 refills | Status: DC
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
## 2017-11-24 ENCOUNTER — Other Ambulatory Visit: Payer: Self-pay | Admitting: Internal Medicine

## 2017-11-24 DIAGNOSIS — Z1231 Encounter for screening mammogram for malignant neoplasm of breast: Secondary | ICD-10-CM

## 2018-01-06 ENCOUNTER — Other Ambulatory Visit: Payer: Self-pay | Admitting: Internal Medicine

## 2018-01-06 ENCOUNTER — Ambulatory Visit
Admission: RE | Admit: 2018-01-06 | Discharge: 2018-01-06 | Disposition: A | Discharge status: Home / Self Care | Payer: Medicaid Other | Source: Ambulatory Visit | Attending: Internal Medicine | Admitting: Internal Medicine

## 2018-01-06 DIAGNOSIS — Z1231 Encounter for screening mammogram for malignant neoplasm of breast: Secondary | ICD-10-CM

## 2018-02-15 ENCOUNTER — Ambulatory Visit: Payer: Medicaid Other | Admitting: Neurology

## 2018-06-09 ENCOUNTER — Other Ambulatory Visit (HOSPITAL_COMMUNITY): Payer: Self-pay | Admitting: Family Medicine

## 2018-06-09 DIAGNOSIS — R519 Headache, unspecified: Secondary | ICD-10-CM

## 2018-06-09 DIAGNOSIS — G8929 Other chronic pain: Secondary | ICD-10-CM

## 2018-06-13 ENCOUNTER — Other Ambulatory Visit: Payer: Self-pay

## 2018-06-13 ENCOUNTER — Ambulatory Visit (HOSPITAL_COMMUNITY)
Admission: RE | Admit: 2018-06-13 | Discharge: 2018-06-13 | Disposition: A | Discharge status: Home / Self Care | Payer: Medicaid Other | Source: Ambulatory Visit | Attending: Internal Medicine | Admitting: Internal Medicine

## 2018-06-13 ENCOUNTER — Ambulatory Visit (HOSPITAL_COMMUNITY): Admission: RE | Admit: 2018-06-13 | Payer: Medicaid Other | Source: Ambulatory Visit

## 2018-06-13 DIAGNOSIS — R51 Headache: Secondary | ICD-10-CM | POA: Diagnosis not present

## 2018-06-13 DIAGNOSIS — G8929 Other chronic pain: Secondary | ICD-10-CM

## 2018-06-13 NOTE — Progress Notes (Signed)
TCD completed. Results in Chart review CV Proc. [place] Raquon Milledge,RVS  06/13/2018, 3:53 PM

## 2018-06-20 ENCOUNTER — Ambulatory Visit (HOSPITAL_BASED_OUTPATIENT_CLINIC_OR_DEPARTMENT_OTHER)
Admission: RE | Admit: 2018-06-20 | Discharge: 2018-06-20 | Disposition: A | Discharge status: Home / Self Care | Payer: Medicaid Other | Source: Ambulatory Visit | Attending: Family Medicine | Admitting: Family Medicine

## 2018-06-20 ENCOUNTER — Other Ambulatory Visit: Payer: Self-pay

## 2018-06-20 ENCOUNTER — Ambulatory Visit (HOSPITAL_COMMUNITY)
Admission: RE | Admit: 2018-06-20 | Discharge: 2018-06-20 | Disposition: A | Discharge status: Home / Self Care | Payer: Medicaid Other | Source: Ambulatory Visit | Attending: Family Medicine | Admitting: Family Medicine

## 2018-06-20 DIAGNOSIS — R51 Headache: Secondary | ICD-10-CM | POA: Diagnosis not present

## 2018-06-20 DIAGNOSIS — G8929 Other chronic pain: Secondary | ICD-10-CM

## 2018-06-20 DIAGNOSIS — R519 Headache, unspecified: Secondary | ICD-10-CM

## 2018-07-10 ENCOUNTER — Ambulatory Visit (INDEPENDENT_AMBULATORY_CARE_PROVIDER_SITE_OTHER): Payer: Medicaid Other | Admitting: Otolaryngology

## 2018-07-10 DIAGNOSIS — J310 Chronic rhinitis: Secondary | ICD-10-CM | POA: Diagnosis not present

## 2018-07-10 DIAGNOSIS — J343 Hypertrophy of nasal turbinates: Secondary | ICD-10-CM

## 2018-07-10 DIAGNOSIS — R51 Headache: Secondary | ICD-10-CM | POA: Diagnosis not present

## 2018-08-10 ENCOUNTER — Other Ambulatory Visit: Payer: Self-pay

## 2018-08-10 ENCOUNTER — Encounter: Payer: Self-pay | Admitting: Physical Therapy

## 2018-08-10 ENCOUNTER — Ambulatory Visit: Payer: Medicaid Other | Attending: Orthopedic Surgery | Admitting: Physical Therapy

## 2018-08-10 DIAGNOSIS — M25511 Pain in right shoulder: Secondary | ICD-10-CM | POA: Insufficient documentation

## 2018-08-10 DIAGNOSIS — G8929 Other chronic pain: Secondary | ICD-10-CM | POA: Diagnosis present

## 2018-08-10 DIAGNOSIS — R293 Abnormal posture: Secondary | ICD-10-CM | POA: Insufficient documentation

## 2018-08-10 DIAGNOSIS — M6281 Muscle weakness (generalized): Secondary | ICD-10-CM | POA: Diagnosis present
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
## 2018-08-18 ENCOUNTER — Other Ambulatory Visit: Payer: Self-pay

## 2018-08-18 ENCOUNTER — Ambulatory Visit: Payer: Medicaid Other | Admitting: Physical Therapy

## 2018-08-18 ENCOUNTER — Encounter: Payer: Self-pay | Admitting: Physical Therapy

## 2018-08-18 DIAGNOSIS — R293 Abnormal posture: Secondary | ICD-10-CM

## 2018-08-18 DIAGNOSIS — G8929 Other chronic pain: Secondary | ICD-10-CM

## 2018-08-18 DIAGNOSIS — M25511 Pain in right shoulder: Secondary | ICD-10-CM

## 2018-08-18 DIAGNOSIS — M6281 Muscle weakness (generalized): Secondary | ICD-10-CM
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
## 2018-08-22 ENCOUNTER — Ambulatory Visit: Payer: Medicaid Other | Admitting: Neurology

## 2018-08-23 ENCOUNTER — Ambulatory Visit: Payer: Medicaid Other | Admitting: Physical Therapy

## 2018-08-25 ENCOUNTER — Other Ambulatory Visit: Payer: Self-pay | Admitting: Orthopedic Surgery

## 2018-08-25 DIAGNOSIS — M25511 Pain in right shoulder: Secondary | ICD-10-CM

## 2018-08-29 ENCOUNTER — Other Ambulatory Visit: Payer: Self-pay

## 2018-08-29 ENCOUNTER — Ambulatory Visit: Payer: Medicaid Other | Attending: Orthopedic Surgery | Admitting: Physical Therapy

## 2018-08-29 ENCOUNTER — Encounter: Payer: Self-pay | Admitting: Physical Therapy

## 2018-08-29 DIAGNOSIS — G8929 Other chronic pain: Secondary | ICD-10-CM

## 2018-08-29 DIAGNOSIS — R293 Abnormal posture: Secondary | ICD-10-CM | POA: Diagnosis present

## 2018-08-29 DIAGNOSIS — M25511 Pain in right shoulder: Secondary | ICD-10-CM | POA: Insufficient documentation

## 2018-08-29 DIAGNOSIS — M6281 Muscle weakness (generalized): Secondary | ICD-10-CM | POA: Diagnosis present
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
## 2018-09-01 ENCOUNTER — Encounter: Payer: Self-pay | Admitting: Physical Therapy

## 2018-09-01 ENCOUNTER — Other Ambulatory Visit: Payer: Self-pay

## 2018-09-01 ENCOUNTER — Ambulatory Visit: Payer: Medicaid Other | Admitting: Physical Therapy

## 2018-09-01 DIAGNOSIS — G8929 Other chronic pain: Secondary | ICD-10-CM

## 2018-09-01 DIAGNOSIS — M25511 Pain in right shoulder: Secondary | ICD-10-CM

## 2018-09-01 DIAGNOSIS — M6281 Muscle weakness (generalized): Secondary | ICD-10-CM

## 2018-09-01 DIAGNOSIS — R293 Abnormal posture: Secondary | ICD-10-CM
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
## 2018-09-04 ENCOUNTER — Encounter (HOSPITAL_COMMUNITY): Payer: Self-pay

## 2018-09-04 ENCOUNTER — Observation Stay (HOSPITAL_COMMUNITY)
Admission: EM | Admit: 2018-09-04 | Discharge: 2018-09-05 | Disposition: A | Discharge status: Home / Self Care | Payer: Medicaid Other | Attending: Internal Medicine | Admitting: Internal Medicine

## 2018-09-04 ENCOUNTER — Other Ambulatory Visit: Payer: Self-pay

## 2018-09-04 ENCOUNTER — Emergency Department (HOSPITAL_COMMUNITY): Payer: Medicaid Other

## 2018-09-04 DIAGNOSIS — G4730 Sleep apnea, unspecified: Secondary | ICD-10-CM | POA: Insufficient documentation

## 2018-09-04 DIAGNOSIS — R519 Headache, unspecified: Secondary | ICD-10-CM | POA: Diagnosis present

## 2018-09-04 DIAGNOSIS — Z7951 Long term (current) use of inhaled steroids: Secondary | ICD-10-CM | POA: Insufficient documentation

## 2018-09-04 DIAGNOSIS — K219 Gastro-esophageal reflux disease without esophagitis: Secondary | ICD-10-CM | POA: Insufficient documentation

## 2018-09-04 DIAGNOSIS — R7303 Prediabetes: Secondary | ICD-10-CM | POA: Diagnosis not present

## 2018-09-04 DIAGNOSIS — Z7982 Long term (current) use of aspirin: Secondary | ICD-10-CM | POA: Diagnosis not present

## 2018-09-04 DIAGNOSIS — Z885 Allergy status to narcotic agent status: Secondary | ICD-10-CM | POA: Diagnosis not present

## 2018-09-04 DIAGNOSIS — E876 Hypokalemia: Secondary | ICD-10-CM | POA: Diagnosis not present

## 2018-09-04 DIAGNOSIS — Z6837 Body mass index (BMI) 37.0-37.9, adult: Secondary | ICD-10-CM | POA: Insufficient documentation

## 2018-09-04 DIAGNOSIS — I10 Essential (primary) hypertension: Secondary | ICD-10-CM | POA: Diagnosis not present

## 2018-09-04 DIAGNOSIS — I2510 Atherosclerotic heart disease of native coronary artery without angina pectoris: Secondary | ICD-10-CM | POA: Diagnosis not present

## 2018-09-04 DIAGNOSIS — E785 Hyperlipidemia, unspecified: Secondary | ICD-10-CM | POA: Insufficient documentation

## 2018-09-04 DIAGNOSIS — R51 Headache: Secondary | ICD-10-CM | POA: Insufficient documentation

## 2018-09-04 DIAGNOSIS — J449 Chronic obstructive pulmonary disease, unspecified: Secondary | ICD-10-CM | POA: Diagnosis present

## 2018-09-04 DIAGNOSIS — F17210 Nicotine dependence, cigarettes, uncomplicated: Secondary | ICD-10-CM | POA: Diagnosis not present

## 2018-09-04 DIAGNOSIS — Z96652 Presence of left artificial knee joint: Secondary | ICD-10-CM | POA: Insufficient documentation

## 2018-09-04 DIAGNOSIS — Z888 Allergy status to other drugs, medicaments and biological substances status: Secondary | ICD-10-CM | POA: Diagnosis not present

## 2018-09-04 DIAGNOSIS — F329 Major depressive disorder, single episode, unspecified: Secondary | ICD-10-CM | POA: Diagnosis not present

## 2018-09-04 DIAGNOSIS — E669 Obesity, unspecified: Secondary | ICD-10-CM | POA: Insufficient documentation

## 2018-09-04 DIAGNOSIS — I639 Cerebral infarction, unspecified: Secondary | ICD-10-CM | POA: Diagnosis not present

## 2018-09-04 DIAGNOSIS — G8929 Other chronic pain: Secondary | ICD-10-CM | POA: Diagnosis present

## 2018-09-04 DIAGNOSIS — R2689 Other abnormalities of gait and mobility: Secondary | ICD-10-CM | POA: Diagnosis not present

## 2018-09-04 DIAGNOSIS — Z1159 Encounter for screening for other viral diseases: Secondary | ICD-10-CM | POA: Insufficient documentation

## 2018-09-04 DIAGNOSIS — R471 Dysarthria and anarthria: Secondary | ICD-10-CM | POA: Insufficient documentation

## 2018-09-04 DIAGNOSIS — R4701 Aphasia: Secondary | ICD-10-CM | POA: Diagnosis present

## 2018-09-04 DIAGNOSIS — R29818 Other symptoms and signs involving the nervous system: Principal | ICD-10-CM | POA: Diagnosis present

## 2018-09-04 DIAGNOSIS — Z79899 Other long term (current) drug therapy: Secondary | ICD-10-CM | POA: Insufficient documentation

## 2018-09-04 DIAGNOSIS — G459 Transient cerebral ischemic attack, unspecified: Secondary | ICD-10-CM

## 2018-09-04 LAB — DIFFERENTIAL
Abs Immature Granulocytes: 0.1 10*3/uL — ABNORMAL HIGH (ref 0.00–0.07)
Basophils Absolute: 0 10*3/uL (ref 0.0–0.1)
Basophils Relative: 0 %
Eosinophils Absolute: 0 10*3/uL (ref 0.0–0.5)
Eosinophils Relative: 0 %
Immature Granulocytes: 1 %
Lymphocytes Relative: 19 %
Lymphs Abs: 3.2 10*3/uL (ref 0.7–4.0)
Monocytes Absolute: 1 10*3/uL (ref 0.1–1.0)
Monocytes Relative: 6 %
Neutro Abs: 13.1 10*3/uL — ABNORMAL HIGH (ref 1.7–7.7)
Neutrophils Relative %: 74 %

## 2018-09-04 LAB — COMPREHENSIVE METABOLIC PANEL
ALT: 20 U/L (ref 0–44)
AST: 18 U/L (ref 15–41)
Albumin: 4.3 g/dL (ref 3.5–5.0)
Alkaline Phosphatase: 70 U/L (ref 38–126)
Anion gap: 12 (ref 5–15)
BUN: 8 mg/dL (ref 6–20)
CO2: 23 mmol/L (ref 22–32)
Calcium: 9.3 mg/dL (ref 8.9–10.3)
Chloride: 105 mmol/L (ref 98–111)
Creatinine, Ser: 0.81 mg/dL (ref 0.44–1.00)
GFR calc Af Amer: >60 mL/min (ref >60)
GFR calc non Af Amer: >60 mL/min (ref >60)
Glucose, Bld: 128 mg/dL — ABNORMAL HIGH (ref 70–99)
Potassium: 3.1 mmol/L — ABNORMAL LOW (ref 3.5–5.1)
Sodium: 140 mmol/L (ref 135–145)
Total Bilirubin: 1 mg/dL (ref 0.3–1.2)
Total Protein: 6.8 g/dL (ref 6.5–8.1)

## 2018-09-04 LAB — I-STAT CHEM 8, ED
BUN: 9 mg/dL (ref 6–20)
Calcium, Ion: 1.2 mmol/L (ref 1.15–1.40)
Chloride: 103 mmol/L (ref 98–111)
Creatinine, Ser: 0.8 mg/dL (ref 0.44–1.00)
Glucose, Bld: 118 mg/dL — ABNORMAL HIGH (ref 70–99)
HCT: 38 % (ref 36.0–46.0)
Hemoglobin: 12.9 g/dL (ref 12.0–15.0)
Potassium: 3.5 mmol/L (ref 3.5–5.1)
Sodium: 142 mmol/L (ref 135–145)
TCO2: 27 mmol/L (ref 22–32)

## 2018-09-04 LAB — URINALYSIS, ROUTINE W REFLEX MICROSCOPIC
Bilirubin Urine: NEGATIVE
Glucose, UA: NEGATIVE mg/dL
Hgb urine dipstick: NEGATIVE
Ketones, ur: NEGATIVE mg/dL
Leukocytes,Ua: NEGATIVE
Nitrite: NEGATIVE
Protein, ur: NEGATIVE mg/dL
Specific Gravity, Urine: 1.014 (ref 1.005–1.030)
pH: 5 (ref 5.0–8.0)

## 2018-09-04 LAB — PROTIME-INR
INR: 1.1 (ref 0.8–1.2)
Prothrombin Time: 13.7 seconds (ref 11.4–15.2)

## 2018-09-04 LAB — CBC
HCT: 38.2 % (ref 36.0–46.0)
Hemoglobin: 12.5 g/dL (ref 12.0–15.0)
MCH: 29.6 pg (ref 26.0–34.0)
MCHC: 32.7 g/dL (ref 30.0–36.0)
MCV: 90.5 fL (ref 80.0–100.0)
Platelets: 275 10*3/uL (ref 150–400)
RBC: 4.22 MIL/uL (ref 3.87–5.11)
RDW: 12.4 % (ref 11.5–15.5)
WBC: 17.6 10*3/uL — ABNORMAL HIGH (ref 4.0–10.5)
nRBC: 0 % (ref 0.0–0.2)

## 2018-09-04 LAB — I-STAT BETA HCG BLOOD, ED (MC, WL, AP ONLY): I-stat hCG, quantitative: <5 m[IU]/mL (ref <5)

## 2018-09-04 LAB — APTT: aPTT: 27 seconds (ref 24–36)

## 2018-09-04 MED ORDER — SODIUM CHLORIDE 0.9% FLUSH
3.0000 mL | Freq: Once | INTRAVENOUS | Status: DC
Start: 2018-09-04 — End: 2018-09-06

## 2018-09-04 NOTE — ED Triage Notes (Signed)
Pt BIB [facility] for eval headache x 5 mos. Per EMS report, pt was with a friend earlier today and the friend noted some changes in speech and pt stated she felt off balance. Pt went to PCP today for eval who had unremarkable neuro exam. EMS reports neg stroke scale, no focal neuro deficits. Pt reports some numbness to crown of head, but that is chronic in nature x 5 mos. Speech clear, no droop, no drift, no unilateral weakness in triage. Resps e/u, NAD

## 2018-09-05 ENCOUNTER — Encounter (HOSPITAL_COMMUNITY): Payer: Medicaid Other

## 2018-09-05 ENCOUNTER — Observation Stay (HOSPITAL_COMMUNITY): Payer: Medicaid Other

## 2018-09-05 ENCOUNTER — Observation Stay (HOSPITAL_BASED_OUTPATIENT_CLINIC_OR_DEPARTMENT_OTHER): Payer: Medicaid Other

## 2018-09-05 ENCOUNTER — Ambulatory Visit: Payer: Medicaid Other | Admitting: Neurology

## 2018-09-05 ENCOUNTER — Telehealth: Payer: Self-pay | Admitting: *Deleted

## 2018-09-05 ENCOUNTER — Encounter (HOSPITAL_COMMUNITY): Payer: Self-pay | Admitting: Family Medicine

## 2018-09-05 DIAGNOSIS — R29818 Other symptoms and signs involving the nervous system: Principal | ICD-10-CM

## 2018-09-05 DIAGNOSIS — J449 Chronic obstructive pulmonary disease, unspecified: Secondary | ICD-10-CM | POA: Diagnosis not present

## 2018-09-05 DIAGNOSIS — R51 Headache: Secondary | ICD-10-CM

## 2018-09-05 DIAGNOSIS — E876 Hypokalemia: Secondary | ICD-10-CM

## 2018-09-05 DIAGNOSIS — I340 Nonrheumatic mitral (valve) insufficiency: Secondary | ICD-10-CM

## 2018-09-05 DIAGNOSIS — I10 Essential (primary) hypertension: Secondary | ICD-10-CM

## 2018-09-05 DIAGNOSIS — G43109 Migraine with aura, not intractable, without status migrainosus: Secondary | ICD-10-CM

## 2018-09-05 DIAGNOSIS — I25119 Atherosclerotic heart disease of native coronary artery with unspecified angina pectoris: Secondary | ICD-10-CM | POA: Diagnosis not present

## 2018-09-05 LAB — CBG MONITORING, ED: Glucose-Capillary: 117 mg/dL — ABNORMAL HIGH (ref 70–99)

## 2018-09-05 LAB — HIV ANTIBODY (ROUTINE TESTING W REFLEX): HIV Screen 4th Generation wRfx: NONREACTIVE

## 2018-09-05 LAB — RAPID URINE DRUG SCREEN, HOSP PERFORMED
Amphetamines: NOT DETECTED
Barbiturates: NOT DETECTED
Benzodiazepines: NOT DETECTED
Cocaine: NOT DETECTED
Opiates: POSITIVE — AB
Tetrahydrocannabinol: NOT DETECTED

## 2018-09-05 LAB — BASIC METABOLIC PANEL
Anion gap: 10 (ref 5–15)
BUN: 10 mg/dL (ref 6–20)
CO2: 21 mmol/L — ABNORMAL LOW (ref 22–32)
Calcium: 8.9 mg/dL (ref 8.9–10.3)
Chloride: 110 mmol/L (ref 98–111)
Creatinine, Ser: 0.82 mg/dL (ref 0.44–1.00)
GFR calc Af Amer: >60 mL/min (ref >60)
GFR calc non Af Amer: >60 mL/min (ref >60)
Glucose, Bld: 140 mg/dL — ABNORMAL HIGH (ref 70–99)
Potassium: 3.3 mmol/L — ABNORMAL LOW (ref 3.5–5.1)
Sodium: 141 mmol/L (ref 135–145)

## 2018-09-05 LAB — CBC WITH DIFFERENTIAL/PLATELET
Abs Immature Granulocytes: 0 10*3/uL (ref 0.00–0.07)
Basophils Absolute: 0 10*3/uL (ref 0.0–0.1)
Basophils Relative: 0 %
Eosinophils Absolute: 0.2 10*3/uL (ref 0.0–0.5)
Eosinophils Relative: 1 %
HCT: 37.4 % (ref 36.0–46.0)
Hemoglobin: 12.3 g/dL (ref 12.0–15.0)
Lymphocytes Relative: 42 %
Lymphs Abs: 7.1 10*3/uL — ABNORMAL HIGH (ref 0.7–4.0)
MCH: 29.9 pg (ref 26.0–34.0)
MCHC: 32.9 g/dL (ref 30.0–36.0)
MCV: 90.8 fL (ref 80.0–100.0)
Monocytes Absolute: 1.5 10*3/uL — ABNORMAL HIGH (ref 0.1–1.0)
Monocytes Relative: 9 %
Neutro Abs: 8.1 10*3/uL — ABNORMAL HIGH (ref 1.7–7.7)
Neutrophils Relative %: 48 %
Platelets: 258 10*3/uL (ref 150–400)
RBC: 4.12 MIL/uL (ref 3.87–5.11)
RDW: 12.6 % (ref 11.5–15.5)
WBC: 16.9 10*3/uL — ABNORMAL HIGH (ref 4.0–10.5)
nRBC: 0 % (ref 0.0–0.2)
nRBC: 0 /100 WBC

## 2018-09-05 LAB — HEMOGLOBIN A1C
Hgb A1c MFr Bld: 6 % — ABNORMAL HIGH (ref 4.8–5.6)
Mean Plasma Glucose: 125.5 mg/dL

## 2018-09-05 LAB — ECHOCARDIOGRAM COMPLETE
Height: 65 in
Weight: 3626.13 oz

## 2018-09-05 LAB — LIPID PANEL
Cholesterol: 117 mg/dL (ref 0–200)
HDL: 60 mg/dL (ref >40)
LDL Cholesterol: 41 mg/dL (ref 0–99)
Total CHOL/HDL Ratio: 2 RATIO
Triglycerides: 78 mg/dL (ref <150)
VLDL: 16 mg/dL (ref 0–40)

## 2018-09-05 LAB — SARS CORONAVIRUS 2 (TAT 6-24 HRS): SARS Coronavirus 2: NEGATIVE

## 2018-09-05 LAB — MAGNESIUM: Magnesium: 1.8 mg/dL (ref 1.7–2.4)

## 2018-09-05 MED ORDER — ASPIRIN 300 MG RE SUPP: 300.0000 mg | Freq: Every day | RECTAL | Status: DC

## 2018-09-05 MED ORDER — STROKE: EARLY STAGES OF RECOVERY BOOK
Freq: Once | Status: AC
  Administered 2018-09-05: 05:00:00
  Filled 2018-09-05: qty 1

## 2018-09-05 MED ORDER — PANTOPRAZOLE SODIUM 40 MG PO TBEC
40.0000 mg | DELAYED_RELEASE_TABLET | Freq: Every day | ORAL | Status: DC
  Administered 2018-09-05: 10:00:00 40 mg via ORAL
  Filled 2018-09-05: qty 1

## 2018-09-05 MED ORDER — MOMETASONE FURO-FORMOTEROL FUM 100-5 MCG/ACT IN AERO
2.0000 | INHALATION_SPRAY | Freq: Two times a day (BID) | RESPIRATORY_TRACT | Status: DC
  Administered 2018-09-05: 2 via RESPIRATORY_TRACT
  Filled 2018-09-05: qty 8.8

## 2018-09-05 MED ORDER — METOPROLOL SUCCINATE ER 25 MG PO TB24: 25.0000 mg | ORAL_TABLET | Freq: Every day | ORAL | Status: DC

## 2018-09-05 MED ORDER — DIPHENHYDRAMINE HCL 50 MG/ML IJ SOLN
25.0000 mg | Freq: Once | INTRAMUSCULAR | Status: AC
  Administered 2018-09-05: 25 mg via INTRAVENOUS
  Filled 2018-09-05: qty 1

## 2018-09-05 MED ORDER — SUMATRIPTAN SUCCINATE 25 MG PO TABS: 25.0000 mg | ORAL_TABLET | ORAL | 11 refills | Status: DC | PRN

## 2018-09-05 MED ORDER — POTASSIUM CHLORIDE IN NACL 20-0.9 MEQ/L-% IV SOLN
INTRAVENOUS | Status: AC
  Administered 2018-09-05: 05:00:00 via INTRAVENOUS
  Filled 2018-09-05: qty 1000

## 2018-09-05 MED ORDER — ACETAMINOPHEN-CODEINE #3 300-30 MG PO TABS
1.0000 | ORAL_TABLET | Freq: Once | ORAL | Status: AC
  Administered 2018-09-05: 08:00:00 1 via ORAL
  Filled 2018-09-05: qty 1

## 2018-09-05 MED ORDER — ACETAMINOPHEN 325 MG PO TABS: 650.0000 mg | ORAL_TABLET | Freq: Four times a day (QID) | ORAL | Status: DC | PRN

## 2018-09-05 MED ORDER — SENNOSIDES-DOCUSATE SODIUM 8.6-50 MG PO TABS: 1.0000 | ORAL_TABLET | Freq: Every evening | ORAL | Status: DC | PRN

## 2018-09-05 MED ORDER — ACETAMINOPHEN-CODEINE #3 300-30 MG PO TABS
1.0000 | ORAL_TABLET | Freq: Three times a day (TID) | ORAL | Status: DC | PRN
  Administered 2018-09-05: 1 via ORAL
  Filled 2018-09-05: qty 1

## 2018-09-05 MED ORDER — ACETAMINOPHEN 325 MG PO TABS
650.0000 mg | ORAL_TABLET | ORAL | Status: DC | PRN
  Administered 2018-09-05: 650 mg via ORAL
  Filled 2018-09-05: qty 2

## 2018-09-05 MED ORDER — ACETAMINOPHEN 650 MG RE SUPP: 650.0000 mg | RECTAL | Status: DC | PRN

## 2018-09-05 MED ORDER — ATORVASTATIN CALCIUM 40 MG PO TABS
40.0000 mg | ORAL_TABLET | Freq: Every day | ORAL | Status: DC
  Administered 2018-09-05: 40 mg via ORAL
  Filled 2018-09-05: qty 1

## 2018-09-05 MED ORDER — ASPIRIN 325 MG PO TABS
325.0000 mg | ORAL_TABLET | Freq: Every day | ORAL | Status: DC
  Administered 2018-09-05: 325 mg via ORAL
  Filled 2018-09-05: qty 1

## 2018-09-05 MED ORDER — TOPIRAMATE 100 MG PO TABS: 200.0000 mg | ORAL_TABLET | Freq: Every day | ORAL | Status: DC

## 2018-09-05 MED ORDER — ENOXAPARIN SODIUM 40 MG/0.4ML ~~LOC~~ SOLN
40.0000 mg | SUBCUTANEOUS | Status: DC
  Filled 2018-09-05: qty 0.4

## 2018-09-05 MED ORDER — SERTRALINE HCL 100 MG PO TABS: 100.0000 mg | ORAL_TABLET | Freq: Every day | ORAL | Status: DC

## 2018-09-05 MED ORDER — ASPIRIN 81 MG PO TABS: 81.0000 mg | ORAL_TABLET | Freq: Every day | ORAL | Status: DC

## 2018-09-05 MED ORDER — AMLODIPINE BESYLATE 5 MG PO TABS: 5.0000 mg | ORAL_TABLET | Freq: Every day | ORAL | Status: DC

## 2018-09-05 MED ORDER — ASPIRIN-ACETAMINOPHEN-CAFFEINE 250-250-65 MG PO TABS
1.0000 | ORAL_TABLET | Freq: Three times a day (TID) | ORAL | Status: DC | PRN
  Administered 2018-09-05: 1 via ORAL
  Filled 2018-09-05 (×2): qty 1

## 2018-09-05 MED ORDER — ACETAMINOPHEN-CODEINE #3 300-30 MG PO TABS: 1.0000 | ORAL_TABLET | Freq: Three times a day (TID) | ORAL | 0 refills | Status: AC | PRN

## 2018-09-05 MED ORDER — ASPIRIN EC 81 MG PO TBEC: 81.0000 mg | DELAYED_RELEASE_TABLET | Freq: Every day | ORAL | Status: DC

## 2018-09-05 MED ORDER — ACETAMINOPHEN 160 MG/5ML PO SOLN: 650.0000 mg | ORAL | Status: DC | PRN

## 2018-09-05 MED ORDER — ALBUTEROL SULFATE (2.5 MG/3ML) 0.083% IN NEBU: 2.5000 mg | INHALATION_SOLUTION | Freq: Four times a day (QID) | RESPIRATORY_TRACT | Status: DC | PRN

## 2018-09-05 MED ORDER — METOCLOPRAMIDE HCL 5 MG/ML IJ SOLN
10.0000 mg | Freq: Once | INTRAMUSCULAR | Status: AC
  Administered 2018-09-05: 10 mg via INTRAVENOUS
  Filled 2018-09-05: qty 2

## 2018-09-05 MED ORDER — POTASSIUM CHLORIDE CRYS ER 20 MEQ PO TBCR
20.0000 meq | EXTENDED_RELEASE_TABLET | Freq: Once | ORAL | Status: AC
  Administered 2018-09-05: 20 meq via ORAL
  Filled 2018-09-05: qty 1

## 2018-09-05 NOTE — Telephone Encounter (Addendum)
Patient did not make her appt today because she was admitted to the hospital for complicated migraine.

## 2018-09-05 NOTE — Evaluation (Signed)
Occupational Therapy Evaluation Patient Details Name: Meghan FischerVictoria J Bowman MRN: [id number] DOB: 1960/10/07 Today's Date: 09/05/2018    History of Present Illness Meghan FischerVictoria J Bowman is a 58 y.o. female with medical history significant for chronic headaches, hypertension, COPD, and coronary artery disease, now presenting to the emergency department for evaluation of difficulty with speech and balance   Clinical Impression   Pt admitted with above diagnoses, c/o of TIA symptoms causing for hospital admission. PTA pt fully independent community dwelling adult. At time of evaluation, she was completing bed mobility at mod I and functional mobility at supervision level without LOB. Pt did appear with some decreased awareness to deficits and safety, but unsure if this is baseline. Pt also stood at sink to complete various grooming tasks. Pt then sat to wash feet and legs, applied new socks, all without physical assist. At this time pt appears at baseline in BADL functioning, no further OT services warranted. OT will sign off, please reconsult if changes arise.     Follow Up Recommendations  No OT follow up    Equipment Recommendations  None recommended by OT    Recommendations for Other Services       Precautions / Restrictions Precautions Precautions: Fall Restrictions Weight Bearing Restrictions: No      Mobility Bed Mobility Overal bed mobility: Modified Independent                Transfers Overall transfer level: Needs assistance   Transfers: Sit to/from Stand Sit to Stand: Supervision         General transfer comment: overall supervision for line management    Balance Overall balance assessment: Mild deficits observed, not formally tested                                         ADL either performed or assessed with clinical judgement   ADL Overall ADL's : Needs assistance/impaired Eating/Feeding: Independent   Grooming:  Supervision/safety;Standing Grooming Details (indicate cue type and reason): supervision for safety Upper Body Bathing: Supervision/ safety;Sitting;Standing   Lower Body Bathing: Supervison/ safety;Sitting/lateral leans Lower Body Bathing Details (indicate cue type and reason): washed feet in basin on floor Upper Body Dressing : Set up;Sitting   Lower Body Dressing: Set up;Sit to/from stand;Sitting/lateral leans Lower Body Dressing Details (indicate cue type and reason): to don socks Toilet Transfer: Supervision/safety;Regular Toilet   Toileting- Clothing Manipulation and Hygiene: Modified independent   Tub/ Shower Transfer: Supervision/safety;Ambulation   Functional mobility during ADLs: Supervision/safety General ADL Comments: pt overall supervision level, some decerased awareness to deficits but overall is safe with BADL at this time     Vision Baseline Vision/History: No visual deficits       Perception     Praxis      Pertinent Vitals/Pain Pain Assessment: No/denies pain     Hand Dominance     Extremity/Trunk Assessment Upper Extremity Assessment Upper Extremity Assessment: Overall WFL for tasks assessed   Lower Extremity Assessment Lower Extremity Assessment: Overall WFL for tasks assessed       Communication Communication Communication: No difficulties   Cognition Arousal/Alertness: Awake/alert Behavior During Therapy: WFL for tasks assessed/performed Overall Cognitive Status: Within Functional Limits for tasks assessed                                 General Comments:  somewhat impulsive with decreased awareness of deficits, but suspect this is baseline   General Comments       Exercises     Shoulder Instructions      Home Living Family/patient expects to be discharged to:: Private residence Living Arrangements: Non-relatives/Friends Available Help at Discharge: Friend(s) Type of Home: House Home Access: Stairs to enter Engineer, site of Steps: 3 [address]: One level     Bathroom Shower/Tub: Teacher, early years/pre: Standard     Home Equipment: None      Lives With: Friend(s)    Prior Functioning/Environment          Comments: fully ind community member        OT Problem List: Decreased knowledge of use of DME or AE;Decreased knowledge of precautions;Decreased activity tolerance;Decreased safety awareness;Impaired balance (sitting and/or standing)      OT Treatment/Interventions: Self-care/ADL training;Therapeutic exercise;Balance training;Therapeutic activities;Energy conservation;DME and/or AE instruction;Cognitive remediation/compensation    OT Goals(Current goals can be found in the care plan section) Acute Rehab OT Goals Patient Stated Goal: to go home OT Goal Formulation: With patient Time For Goal Achievement: 09/19/18 Potential to Achieve Goals: Good  OT Frequency:     Barriers to D/C:            Co-evaluation              AM-PAC OT "6 Clicks" Daily Activity     Outcome Measure Help from another person eating meals?: None Help from another person taking care of personal grooming?: None Help from another person toileting, which includes using toliet, bedpan, or urinal?: None Help from another person bathing (including washing, rinsing, drying)?: A Little   Help from another person to put on and taking off regular lower body clothing?: A Little 6 Click Score: 18   End of Session Equipment Utilized During Treatment: Gait belt Nurse Communication: Mobility status;Patient requests pain meds  Activity Tolerance: Patient tolerated treatment well Patient left: in bed;with call bell/phone within reach  OT Visit Diagnosis: Other abnormalities of gait and mobility (R26.89);Other symptoms and signs involving the nervous system (R29.898)                Time: 6301-6010 OT Time Calculation (min): 17 min Charges:  OT General Charges $OT Visit: 1  Visit OT Evaluation $OT Eval Low Complexity: 1 Low  Zenovia Jarred, MSOT, OTR/L Behavioral Health OT/ Acute Relief OT [facility] Office: [place] 09/05/2018, 11:19 AM

## 2018-09-05 NOTE — Evaluation (Signed)
Physical Therapy Evaluation Patient Details Name: Meghan Bowman MRN: [id number] DOB: 1960-10-02 Today's Date: 09/05/2018   History of Present Illness  Meghan Bowman is a 58 y.o. female with medical history significant for chronic headaches, hypertension, COPD, and coronary artery disease, now presenting to the emergency department for evaluation of difficulty with speech and balance    Clinical Impression  Pt has been walking in the room with no problems per her report.  PT eval showed poor activity tolerance - resting HR 56bpm and walking 100 feet up to 114bpm and pt very tired - had to stop and catch her breath.  Pt with balance deficits - BERG 40/56.  Pt unsure of left leg stance and standing in staggered stance.  Pt was currently going to OPPT for her right shoulder.  Would benefit from doing some higher level balance activities at OPPT as well.  I educated pt on monitoring her HR.  Pts BP sitting 123/99 and standing 120/75.  She reports being dizzy upon standing often but never mentioned it to her MD.    Follow Up Recommendations Outpatient PT;Supervision for mobility/OOB    Equipment Recommendations  None recommended by PT    Recommendations for Other Services       Precautions / Restrictions Precautions Precautions: Fall Restrictions Weight Bearing Restrictions: No Other Position/Activity Restrictions: pt reports she often has dizziness in standing but just sits back down - hasnt followed up on this or mentioned it      Mobility  Bed Mobility Overal bed mobility: Independent                Transfers Overall transfer level: Independent Equipment used: None Transfers: Sit to/from Stand Sit to Stand: Supervision         General transfer comment: overall supervision for line management  Ambulation/Gait Ambulation/Gait assistance: Min guard Gait Distance (Feet): 150 Feet Assistive device: None Gait Pattern/deviations: WFL(Within Functional Limits)      General Gait Details: pt with no deficits with regular gait.  I had her march - this was hard for her, look side to side and up and down.  pt with no loss of balance but got really tired and had to stop and catch her breath.  HR 114bpm (baseline 56bpm).  I educated pt on monitoring her HR and slowing down with activity  Careers information officertairs            Wheelchair Mobility    Modified Rankin (Stroke Patients Only) Modified Rankin (Stroke Patients Only) Pre-Morbid Rankin Score: No symptoms Modified Rankin: No significant disability     Balance Overall balance assessment: Mild deficits observed, not formally tested                               Standardized Balance Assessment Standardized Balance Assessment : Berg Balance Test Berg Balance Test Sit to Stand: Able to stand without using hands and stabilize independently Standing Unsupported: Able to stand safely 2 minutes Sitting with Back Unsupported but Feet Supported on Floor or Stool: Able to sit safely and securely 2 minutes Stand to Sit: Sits safely with minimal use of hands Transfers: Able to transfer safely, minor use of hands Standing Unsupported with Eyes Closed: Able to stand 10 seconds with supervision Standing Ubsupported with Feet Together: Able to place feet together independently and stand 1 minute safely From Standing, Reach Forward with Outstretched Arm: Can reach forward >12 cm safely (5") From Standing Position,  Pick up Object from Floor: Able to pick up shoe, needs supervision From Standing Position, Turn to Look Behind Over each Shoulder: Turn sideways only but maintains balance Turn 360 Degrees: Able to turn 360 degrees safely but slowly Standing Unsupported, Alternately Place Feet on Step/Stool: Able to complete >2 steps/needs minimal assist Standing Unsupported, One Foot in Front: Needs help to step but can hold 15 seconds Standing on One Leg: Tries to lift leg/unable to hold 3 seconds but remains standing  independently Total Score: 40         Pertinent Vitals/Pain Pain Assessment: No/denies pain    Home Living Family/patient expects to be discharged to:: Private residence Living Arrangements: Non-relatives/Friends Available Help at Discharge: Friend(s) Type of Home: House Home Access: Stairs to enter   Secretary/administratorntrance Stairs-Number of Steps: 3 STE Home Layout: One level Home Equipment: None      Prior Function           Comments: fully ind community member     Higher education careers adviserHand Dominance        Extremity/Trunk Assessment   Upper Extremity Assessment Upper Extremity Assessment: Overall WFL for tasks assessed    Lower Extremity Assessment Lower Extremity Assessment: Generalized weakness    Cervical / Trunk Assessment Cervical / Trunk Assessment: Normal  Communication   Communication: No difficulties  Cognition Arousal/Alertness: Awake/alert Behavior During Therapy: WFL for tasks assessed/performed Overall Cognitive Status: Within Functional Limits for tasks assessed                                 General Comments: somewhat impulsive with decreased awareness of deficits, but suspect this is baseline      General Comments General comments (skin integrity, edema, etc.): pt denies history of recent falls.  Pt reports being independent prior - was out working in her rock garden yesterday morning    Exercises     Assessment/Plan    PT Assessment Patient needs continued PT services  PT Problem List Decreased safety awareness;Decreased activity tolerance;Cardiopulmonary status limiting activity;Decreased balance       PT Treatment Interventions Therapeutic activities;Patient/family education;Balance training;Functional mobility training    PT Goals (Current goals can be found in the Care Plan section)  Acute Rehab PT Goals Patient Stated Goal: to go home and back to OP PT Goal Formulation: With patient Time For Goal Achievement: 09/05/18 Potential to  Achieve Goals: Good    Frequency Min 3X/week   Barriers to discharge        Co-evaluation               AM-PAC PT "6 Clicks" Mobility  Outcome Measure Help needed turning from your back to your side while in a flat bed without using bedrails?: None Help needed moving from lying on your back to sitting on the side of a flat bed without using bedrails?: None Help needed moving to and from a bed to a chair (including a wheelchair)?: None Help needed standing up from a chair using your arms (e.g., wheelchair or bedside chair)?: A Little Help needed to walk in hospital room?: A Little Help needed climbing 3-5 steps with a railing? : A Little 6 Click Score: 21    End of Session Equipment Utilized During Treatment: Gait belt Activity Tolerance: Patient limited by fatigue Patient left: in bed;with call bell/phone within reach Nurse Communication: Mobility status PT Visit Diagnosis: Unsteadiness on feet (R26.81)    Time: 1610-96041150-1215 PT  Time Calculation (min) (ACUTE ONLY): 25 min   Charges:   PT Evaluation $PT Eval Low Complexity: 1 Low PT Treatments $Gait Training: 8-22 mins        09/05/2018   Rande Lawman, PT   Loyal Buba 09/05/2018, 12:27 PM
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
## 2018-09-05 NOTE — Progress Notes (Signed)
STROKE TEAM PROGRESS NOTE   INTERVAL HISTORY Her family is not at the bedside.  Pt stated that for the last 3 days she had migraine and kept getting worse, yesterday 10/10 and today 7/10. She had left facial numbness, speech difficulty, imbalance yesterday with HA and her PCP asked her to come to ER. She has been following with Dr. Terrace ArabiaYan for migraine and was prescribed topamax but not taking it as prescribed.   Vitals:   09/05/18 0505 09/05/18 0520 09/05/18 0805 09/05/18 1155  BP: 113/73 115/75  120/75  Pulse: (!) 51 (!) 52  66  Resp: 18 13  19   Temp: 97.9 F (36.6 C) 98.2 F (36.8 C)  98.2 F (36.8 C)  TempSrc: Oral Oral  Oral  SpO2: 100% 100% 97% 99%  Weight:      Height:        CBC:  Recent Labs  Lab 09/04/18 1722 09/04/18 1749  WBC 17.6*  --   NEUTROABS 13.1*  --   HGB 12.5 12.9  HCT 38.2 38.0  MCV 90.5  --   PLT 275  --     Basic Metabolic Panel:  Recent Labs  Lab 09/04/18 1722 09/04/18 1749 09/05/18 0317  NA 140 142  --   K 3.1* 3.5  --   CL 105 103  --   CO2 23  --   --   GLUCOSE 128* 118*  --   BUN 8 9  --   CREATININE 0.81 0.80  --   CALCIUM 9.3  --   --   MG  --   --  1.8   Lipid Panel:     Component Value Date/Time   CHOL 117 09/05/2018 0317   TRIG 78 09/05/2018 0317   HDL 60 09/05/2018 0317   CHOLHDL 2.0 09/05/2018 0317   VLDL 16 09/05/2018 0317   LDLCALC 41 09/05/2018 0317   HgbA1c:  Lab Results  Component Value Date   HGBA1C 6.0 (H) 09/05/2018   Urine Drug Screen:     Component Value Date/Time   LABOPIA NONE DETECTED 06/09/2016 2235   COCAINSCRNUR NONE DETECTED 06/09/2016 2235   LABBENZ NONE DETECTED 06/09/2016 2235   AMPHETMU NONE DETECTED 06/09/2016 2235   THCU NONE DETECTED 06/09/2016 2235   LABBARB NONE DETECTED 06/09/2016 2235    Alcohol Level     Component Value Date/Time   ETH <5 04/15/2015 1541    IMAGING Ct Head Wo Contrast  Result Date: 09/04/2018 CLINICAL DATA:  Evaluate headache. Change in speech and balance  difficulties. EXAM: CT HEAD WITHOUT CONTRAST TECHNIQUE: Contiguous axial images were obtained from the base of the skull through the vertex without intravenous contrast. COMPARISON:  10/14/2017 FINDINGS: Brain: Small bilateral low-attenuation foci within the basal ganglia are identified and appear new from previous exam compatible with lacunar infarcts. There is also a small focus of low attenuation within the brain stem, new from previous exam, image 4/3. No evidence for acute cortical infarct, intracranial hemorrhage or extra-axial fluid collection or mass. No mass effect identified. Vascular: No hyperdense vessel or unexpected calcification. Skull: Normal. Negative for fracture or focal lesion. Sinuses/Orbits: No acute finding. Other: None IMPRESSION: 1. No acute intracranial abnormality. 2. 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 New from previous exam. Electronically Signed   By: Signa Kellaylor  Stroud M.D.   On: 09/04/2018 18:49   Mr Angio Head Wo Contrast  Result Date: 09/05/2018 CLINICAL DATA:  TIA, initial exam EXAM: MRI HEAD WITHOUT  CONTRAST MRA HEAD WITHOUT CONTRAST TECHNIQUE: Multiplanar, multiecho pulse sequences of the brain and surrounding structures were obtained without intravenous contrast. Angiographic images of the head were obtained using MRA technique without contrast. COMPARISON:  Head CT from yesterday FINDINGS: MRI HEAD FINDINGS Brain: No acute infarction, hemorrhage, hydrocephalus, extra-axial collection or mass lesion. There are a few dilated perivascular spaces. Acute or infarct was suggested on prior head CT but are not seen at the level of the basal ganglia. Three or 4 FLAIR hyperintensities in the cerebral white matter from nonspecific remote insult, commonly seen to this degree at the patient's age. Vascular: Major flow voids are preserved Skull and upper cervical spine: Negative for marrow lesion Sinuses/Orbits: Negative Other:  Intermittently motion degraded, especially sagittal T1 weighted imaging. MRA HEAD FINDINGS Mild motion artifact especially at the level of the cavernous sinuses. No branch occlusion, beading, flow limiting stenosis, or aneurysm. Unremarkable intracranial anatomy. IMPRESSION: Brain MRI: 1. No emergent finding or explanation for symptoms. 2. Motion degraded. Intracranial MRA: Negative. Electronically Signed   By: Marnee SpringJonathon  Watts M.D.   On: 09/05/2018 04:52   Mr Brain Wo Contrast  Result Date: 09/05/2018 CLINICAL DATA:  TIA, initial exam EXAM: MRI HEAD WITHOUT CONTRAST MRA HEAD WITHOUT CONTRAST TECHNIQUE: Multiplanar, multiecho pulse sequences of the brain and surrounding structures were obtained without intravenous contrast. Angiographic images of the head were obtained using MRA technique without contrast. COMPARISON:  Head CT from yesterday FINDINGS: MRI HEAD FINDINGS Brain: No acute infarction, hemorrhage, hydrocephalus, extra-axial collection or mass lesion. There are a few dilated perivascular spaces. Acute or infarct was suggested on prior head CT but are not seen at the level of the basal ganglia. Three or 4 FLAIR hyperintensities in the cerebral white matter from nonspecific remote insult, commonly seen to this degree at the patient's age. Vascular: Major flow voids are preserved Skull and upper cervical spine: Negative for marrow lesion Sinuses/Orbits: Negative Other: Intermittently motion degraded, especially sagittal T1 weighted imaging. MRA HEAD FINDINGS Mild motion artifact especially at the level of the cavernous sinuses. No branch occlusion, beading, flow limiting stenosis, or aneurysm. Unremarkable intracranial anatomy. IMPRESSION: Brain MRI: 1. No emergent finding or explanation for symptoms. 2. Motion degraded. Intracranial MRA: Negative. Electronically Signed   By: Marnee SpringJonathon  Watts M.D.   On: 09/05/2018 04:52    PHYSICAL EXAM  Temp:  [97.9 F (36.6 C)-98.2 F (36.8 C)] 98.2 F (36.8 C)  (08/11 1155) Pulse Rate:  [44-66] 66 (08/11 1155) Resp:  [13-19] 19 (08/11 1155) BP: (113-131)/(73-91) 120/75 (08/11 1155) SpO2:  [97 %-100 %] 99 % (08/11 1155) Weight:  [99.3 kg-102.8 kg] 102.8 kg (08/11 0309)  General - Well nourished, well developed, in no apparent distress.  Ophthalmologic - fundi not visualized due to noncooperation.  Cardiovascular - Regular rate and rhythm.  Mental Status -  Level of arousal and orientation to time, place, and person were intact. Language including expression, naming, repetition, comprehension was assessed and found intact. Attention span and concentration were normal. Fund of Knowledge was assessed and was intact.  Cranial Nerves II - XII - II - Visual field intact OU. III, IV, VI - Extraocular movements intact. V - Facial sensation intact bilaterally. VII - Facial movement intact bilaterally. VIII - Hearing & vestibular intact bilaterally. X - Palate elevates symmetrically. XI - Chin turning & shoulder shrug intact bilaterally. XII - Tongue protrusion intact.  Motor Strength - The patient's strength was normal in all extremities and pronator drift was absent.  Bulk was  normal and fasciculations were absent.   Motor Tone - Muscle tone was assessed at the neck and appendages and was normal.  Reflexes - The patient's reflexes were symmetrical in all extremities and she had no pathological reflexes.  Sensory - Light touch, temperature/pinprick were assessed and were symmetrical.    Coordination - The patient had normal movements in the hands and feet with no ataxia or dysmetria.  Tremor was absent.  Gait and Station - deferred.   ASSESSMENT/PLAN Ms. Meghan Bowman is a 58 y.o. female with history of HTN, CAD, COPD, ETOH abuse, anxiety, PTSD and chronic migraines presenting with slurred speech, L facial numbness and HA.   Most likely complicated migraine. Less likely TIA  CT head No acute abnormality. [place] B basal ganglia and  brainstem.   MRI  No stroke  MRA  negative   Carotid Doppler  No stenosis in May 2020  2D Echo EF 60-65%. No source of embolus   LDL 41  HgbA1c 6.0  UDS positive for opiates  Lovenox 40 mg sq daily for VTE prophylaxis  aspirin 81 mg daily prior to admission, now on aspirin 325 mg daily. Ok to continue baby aspirin at d/c   Therapy recommendations:  OP PT  Disposition:  Return home  Migraine  Follows with Dr Krista [facility] at [facility]  On topamax but not taking as prescribed  She was educated on medication compliance  She will continue to see Dr. Krista Blue  Hypertension  Stable . BP goal normotensive  Hyperlipidemia  Home meds:  lipitor 40, resumed in hospital  LDL 41  Continue statin at discharge  Tobacco abuse  Current smoker  Smoking cessation counseling provided  Pt is willing to quit  Other Stroke Risk Factors  ETOH use, advised to drink no more than 1 drink(s) a day  Hx cocaine use 9 years ago  Obesity, Body mass index is 37.71 kg/m., recommend weight loss, diet and exercise as appropriate   coronary artery disease on aspirin and statin  Obstructive sleep apnea  Other Active Problems  COPD  Hypokalemia  Leukocytosis 17.2  Hospital day # 0  Neurology will sign off. Please call with questions. Pt will follow up with Dr. Krista Blue at [facility] in about 4 weeks. Thanks for the consult.  Rosalin Hawking, MD PhD Stroke Neurology 09/05/2018 2:53 PM    To contact Stroke Continuity provider, please refer to [email or web address] After hours, contact General Neurology

## 2018-09-05 NOTE — Discharge Summary (Signed)
Discharge Summary  Meghan Bowman JFH:[id number] DOB: 1960/12/23  PCP: Meghan Cowman, MD  Admit date: 09/04/2018 Discharge date: 09/05/2018  Time spent: 40 mins   Recommendations for Outpatient Follow-up:  1. PCP in 1 week 2. Neurology in 4 weeks  Discharge Diagnoses:  Active Hospital Problems   Diagnosis Date Noted   Transient neurological symptoms 09/05/2018   CAD (coronary artery disease) 06/09/2016   COPD (chronic obstructive pulmonary disease) ([place]) 06/09/2016   Hypokalemia 10/02/2012   HTN (hypertension) 10/02/2012   Chronic headaches 07/25/2012    Resolved Hospital Problems  No resolved problems to display.    Discharge Condition: Stable  Diet recommendation: Heart healthy/modified carb  Vitals:   09/05/18 0805 09/05/18 1155  BP:  120/75  Pulse:  66  Resp:  19  Temp:  98.2 F (36.8 C)  SpO2: 97% 99%    History of present illness:  Meghan Bowman is a 58 y.o. female with medical history significant for chronic headaches, hypertension, COPD, and coronary artery disease, now presenting to the emergency department for evaluation of difficulty with speech and balance.  Patient has been having difficulty controlling headaches for the last several months, mainly involving the left side of her head, noted that her headache was a little more severe than usual on 09/04/2018, and went on to experience subjective difficulty balancing, left-sided facial numbness, and difficulty finding the right words to use.  A friend felt that she was slurring her speech. Patient continued to have some numbness in the left side of her face, but otherwise felt back to her usual condition. In the ED, Noncontrasted CT is negative for acute intracranial abnormality but notable for chronic appearing lacunar infarcts that are new from the prior study.  Chemistry panel is notable for potassium of 3.1.  CBC features a leukocytosis to 17,600.  Neurology was consulted by the ED physician and  hospitalists asked to admit.     Today, patient still complained of headaches, denies any other symptoms, denies any chest pain, shortness of breath, nausea/vomiting, abdominal pain, fever/chills.  Of note, patient has been previously diagnosed with migraines but was in denial, never took any of the medications prescribed for migraine.  Work-up currently unremarkable, neurology signed off.  Patient stable to be discharged home to follow-up with neurology and patient assured me she would start taking her migraine medications.  Hospital Course:  Principal Problem:   Transient neurological symptoms Active Problems:   Chronic headaches   HTN (hypertension)   Hypokalemia   COPD (chronic obstructive pulmonary disease) ([facility])   CAD (coronary artery disease)   Likely complicated migraine/TIA CTA head showed old lacunar infarct in the basal ganglia and brainstem MRI brain/MRA head unremarkable, although motion degraded Carotid Doppler showed no stenosis in 05/2018 Echo with EF of 60 to 65%, no source of embolus LDL 41, HbA1c 6 Neurology on board, okay to continue baby aspirin.  Follow-up with Meghan Bowman neurology in the outpatient Patient encouraged to take her Topamax daily, and Imitrex as needed SLP/OP/PT no further recommendation Follow-up with PCP as well  Leukocytosis Afebrile, completely asymptomatic Patient currently on prednisone for a bone spur on her right shoulder Follow-up with PCP, with repeat labs  Prediabetes A1c 6 Follow-up with PCP  Hypertension Stable  Hyperlipidemia/CAD LDL 41 Continue statin, ASA  COPD Stable Continue home regimen  Tobacco abuse Advised to quit  Obesity Lifestyle modification advised          Malnutrition Type:      Malnutrition  Characteristics:      Nutrition Interventions:      Estimated body mass index is 37.71 kg/m as calculated from the following:   Height as of this encounter: 5\' 5"  (1.651 m).   Weight as of  this encounter: 102.8 kg.    Procedures:  None  Consultations:  Neurology  Discharge Exam: BP 120/75 (BP Location: Left Arm)    Pulse 66    Temp 98.2 F (36.8 C) (Oral)    Resp 19    Ht 5\' 5"  (1.651 m)    Wt 102.8 kg    SpO2 99%    BMI 37.71 kg/m   General: NAD Cardiovascular: S1, S2 present Respiratory: CTA B Neurology: No focal neurologic deficits noted  Discharge Instructions You were cared for by a hospitalist during your hospital stay. If you have any questions about your discharge medications or the care you received while you were in the hospital after you are discharged, you can call the unit and asked to speak with the hospitalist on call if the hospitalist that took care of you is not available. Once you are discharged, your primary care physician will handle any further medical issues. Please note that NO REFILLS for any discharge medications will be authorized once you are discharged, as it is imperative that you return to your primary care physician (or establish a relationship with a primary care physician if you do not have one) for your aftercare needs so that they can reassess your need for medications and monitor your lab values.  Discharge Instructions    Ambulatory referral to Neurology   Complete by: As directed    An appointment is requested in approximately: 4 weeks. Pt is Meghan Bowman pt in the past. Thanks.   Ambulatory referral to Physical Therapy   Complete by: As directed      Allergies as of 09/05/2018      Reactions   Morphine And Related Anaphylaxis, Hives   Causes VERY BAD ANXIETY   Other Other (See Comments)   Black mold - Causes lungs to collapse  PLEASE BE AWARE THAT PT IS A RECOVERING ALCOHOLIC AND DRUG ADDICT AND WOULD RATHER NOT USE PAIN MEDICATION ON CONTINUOUS BASIS  AFTER LEAVING THE HOSPITAL   Tape Itching, Rash   Please use "paper" tape  Bandages are worse      Medication List    TAKE these medications   acetaminophen-codeine 300-30  MG tablet Commonly known as: TYLENOL #3 Take 1 tablet by mouth every 8 (eight) hours as needed for up to 5 days for moderate pain.   amLODipine 5 MG tablet Commonly known as: NORVASC Take 5 mg by mouth daily.   aspirin 81 MG tablet Take 81 mg by mouth daily.   atorvastatin 40 MG tablet Commonly known as: LIPITOR Take 40 mg by mouth daily.   budesonide-formoterol 80-4.5 MCG/ACT inhaler Commonly known as: SYMBICORT Inhale 2 puffs into the lungs 2 (two) times daily as needed (wheezing).   EPINEPHrine 0.3 mg/0.3 mL Soaj injection Commonly known as: EPI-PEN Inject 0.3 mg into the muscle as needed (for emergencies).   metoprolol succinate 25 MG 24 hr tablet Commonly known as: TOPROL-XL Take 25 mg by mouth daily.   nitroGLYCERIN 0.4 MG SL tablet Commonly known as: NITROSTAT Place 0.4 mg under the tongue every 5 (five) minutes as needed for chest pain.   ondansetron 4 MG disintegrating tablet Commonly known as: Zofran ODT Take 1 tablet (4 mg total) by mouth every 8 (eight)  hours as needed.   pantoprazole 20 MG tablet Commonly known as: PROTONIX Take 40 mg by mouth daily.   ProAir HFA 108 (90 Base) MCG/ACT inhaler Generic drug: albuterol Inhale 2 puffs into the lungs every 6 (six) hours as needed for wheezing or shortness of breath.   sertraline 100 MG tablet Commonly known as: ZOLOFT Take 100 mg by mouth at bedtime.   SUMAtriptan 25 MG tablet Commonly known as: Imitrex Take 1 tablet (25 mg total) by mouth every 2 (two) hours as needed for migraine. May repeat in 2 hours if headache persists or recurs. What changed: additional instructions   topiramate 100 MG tablet Commonly known as: TOPAMAX Take 2 tablets (200 mg total) by mouth at bedtime. Please discard previous order of 200mg  bid, change the instruction to 200mg  every night What changed: additional instructions   Vitamin D (Ergocalciferol) 1.25 MG (50000 UT) Caps capsule Commonly known as: DRISDOL Take 50,000  Units by mouth every Friday.   Xyzal 5 MG tablet Generic drug: levocetirizine Take 5 mg by mouth every evening.      Allergies  Allergen Reactions   Morphine And Related Anaphylaxis and Hives    Causes VERY BAD ANXIETY   Other Other (See Comments)    Black mold - Causes lungs to collapse   PLEASE BE AWARE THAT PT IS A RECOVERING ALCOHOLIC AND DRUG ADDICT AND WOULD RATHER NOT USE PAIN MEDICATION ON CONTINUOUS BASIS  AFTER LEAVING THE HOSPITAL   Tape Itching and Rash    Please use "paper" tape  Bandages are worse   Follow-up Information    Levert Feinstein, MD. Schedule an appointment as soon as possible for a visit in 4 week(s).   Specialty: Neurology Contact information: [address] [place] [address] [phone]        [facility] Follow up.   Specialty: Rehabilitation Why: They will contact you for the first appointment Contact information: [address] [id number] mc [address] [place] [address] [phone]       Knox Royalty, MD. Schedule an appointment as soon as possible for a visit in 1 week(s).   Specialty: Family Medicine Contact information: [address] [place] [address] [phone]            The results of significant diagnostics from this hospitalization (including imaging, microbiology, ancillary and laboratory) are listed below for reference.    Significant Diagnostic Studies: Ct Head Wo Contrast  Result Date: 09/04/2018 CLINICAL DATA:  Evaluate headache. Change in speech and balance difficulties. EXAM: CT HEAD WITHOUT CONTRAST TECHNIQUE: Contiguous axial images were obtained from the base of the skull through the vertex without intravenous contrast. COMPARISON:  10/14/2017 FINDINGS: Brain: Small bilateral low-attenuation foci within the basal ganglia are identified and appear new from previous exam compatible with lacunar infarcts. There is also a small focus of low attenuation  within the brain stem, new from previous exam, image 4/3. No evidence for acute cortical infarct, intracranial hemorrhage or extra-axial fluid collection or mass. No mass effect identified. Vascular: No hyperdense vessel or unexpected calcification. Skull: Normal. Negative for fracture or focal lesion. Sinuses/Orbits: No acute finding. Other: None IMPRESSION: 1. No acute intracranial abnormality. 2. 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 New from previous exam. Electronically Signed   By: Signa Kell M.D.   On: 09/04/2018 18:49   Mr Angio Head Wo Contrast  Result Date: 09/05/2018 CLINICAL DATA:  TIA, initial exam EXAM: MRI HEAD WITHOUT  CONTRAST MRA HEAD WITHOUT CONTRAST TECHNIQUE: Multiplanar, multiecho pulse sequences of the brain and surrounding structures were obtained without intravenous contrast. Angiographic images of the head were obtained using MRA technique without contrast. COMPARISON:  Head CT from yesterday FINDINGS: MRI HEAD FINDINGS Brain: No acute infarction, hemorrhage, hydrocephalus, extra-axial collection or mass lesion. There are a few dilated perivascular spaces. Acute or infarct was suggested on prior head CT but are not seen at the level of the basal ganglia. Three or 4 FLAIR hyperintensities in the cerebral white matter from nonspecific remote insult, commonly seen to this degree at the patient's age. Vascular: Major flow voids are preserved Skull and upper cervical spine: Negative for marrow lesion Sinuses/Orbits: Negative Other: Intermittently motion degraded, especially sagittal T1 weighted imaging. MRA HEAD FINDINGS Mild motion artifact especially at the level of the cavernous sinuses. No branch occlusion, beading, flow limiting stenosis, or aneurysm. Unremarkable intracranial anatomy. IMPRESSION: Brain MRI: 1. No emergent finding or explanation for symptoms. 2. Motion degraded. Intracranial MRA: Negative. Electronically Signed   By:  Marnee SpringJonathon  Watts M.D.   On: 09/05/2018 04:52   Mr Brain Wo Contrast  Result Date: 09/05/2018 CLINICAL DATA:  TIA, initial exam EXAM: MRI HEAD WITHOUT CONTRAST MRA HEAD WITHOUT CONTRAST TECHNIQUE: Multiplanar, multiecho pulse sequences of the brain and surrounding structures were obtained without intravenous contrast. Angiographic images of the head were obtained using MRA technique without contrast. COMPARISON:  Head CT from yesterday FINDINGS: MRI HEAD FINDINGS Brain: No acute infarction, hemorrhage, hydrocephalus, extra-axial collection or mass lesion. There are a few dilated perivascular spaces. Acute or infarct was suggested on prior head CT but are not seen at the level of the basal ganglia. Three or 4 FLAIR hyperintensities in the cerebral white matter from nonspecific remote insult, commonly seen to this degree at the patient's age. Vascular: Major flow voids are preserved Skull and upper cervical spine: Negative for marrow lesion Sinuses/Orbits: Negative Other: Intermittently motion degraded, especially sagittal T1 weighted imaging. MRA HEAD FINDINGS Mild motion artifact especially at the level of the cavernous sinuses. No branch occlusion, beading, flow limiting stenosis, or aneurysm. Unremarkable intracranial anatomy. IMPRESSION: Brain MRI: 1. No emergent finding or explanation for symptoms. 2. Motion degraded. Intracranial MRA: Negative. Electronically Signed   By: Marnee SpringJonathon  Watts M.D.   On: 09/05/2018 04:52    Microbiology: Recent Results (from the past 240 hour(s))  SARS CORONAVIRUS 2 Nasal Swab Aptima Multi Swab     Status: None   Collection Time: 09/05/18  2:11 AM   Specimen: Aptima Multi Swab; Nasal Swab  Result Value Ref Range Status   SARS Coronavirus 2 NEGATIVE NEGATIVE Final    Comment: (NOTE) SARS-CoV-2 target nucleic acids are NOT DETECTED. The SARS-CoV-2 RNA is generally detectable in upper and lower respiratory specimens during the acute phase of infection. Negative results  do not preclude SARS-CoV-2 infection, do not rule out co-infections with other pathogens, and should not be used as the sole basis for treatment or other patient management decisions. Negative results must be combined with clinical observations, patient history, and epidemiological information. The expected result is Negative. Fact Sheet for Patients: HairSlick.nohttps://www.fda.gov/media/138098/download Fact Sheet for Healthcare Providers: quierodirigir.comhttps://www.fda.gov/media/138095/download This test is not yet approved or cleared by the [place] FDA and  has been authorized for detection and/or diagnosis of SARS-CoV-2 by FDA under an Emergency Use Authorization (EUA). This EUA will remain  in effect (meaning this test can be used) for the duration of the COVID-19 declaration under Section 56 4(b)(1) of the  Act, 21 U.S.C. section 360bbb-3(b)(1), unless the authorization is terminated or revoked sooner. Performed at [facility], [address] [place], [place] [address]      Labs: Basic Metabolic Panel: Recent Labs  Lab 09/04/18 1722 09/04/18 1749 09/05/18 0317 09/05/18 1449  NA 140 142  --  141  K 3.1* 3.5  --  3.3*  CL 105 103  --  110  CO2 23  --   --  21*  GLUCOSE 128* 118*  --  140*  BUN 8 9  --  10  CREATININE 0.81 0.80  --  0.82  CALCIUM 9.3  --   --  8.9  MG  --   --  1.8  --    Liver Function Tests: Recent Labs  Lab 09/04/18 1722  AST 18  ALT 20  ALKPHOS 70  BILITOT 1.0  PROT 6.8  ALBUMIN 4.3   No results for input(s): LIPASE, AMYLASE in the last 168 hours. No results for input(s): AMMONIA in the last 168 hours. CBC: Recent Labs  Lab 09/04/18 1722 09/04/18 1749 09/05/18 1449  WBC 17.6*  --  16.9*  NEUTROABS 13.1*  --  8.1*  HGB 12.5 12.9 12.3  HCT 38.2 38.0 37.4  MCV 90.5  --  90.8  PLT 275  --  258   Cardiac Enzymes: No results for input(s): CKTOTAL, CKMB, CKMBINDEX, TROPONINI in the last 168 hours. BNP: BNP (last 3 results) No results for  input(s): BNP in the last 8760 hours.  ProBNP (last 3 results) No results for input(s): PROBNP in the last 8760 hours.  CBG: Recent Labs  Lab 09/05/18 0000  GLUCAP 117*       Signed:  Briant CedarNkeiruka J Kemani Demarais, MD [facility] 09/05/2018, 6:36 PM
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
## 2018-09-05 NOTE — TOC Initial Note (Signed)
Transition of Care [facility]) - Initial/Assessment Note    Patient Details  Name: Meghan Bowman MRN: [id number] Date of Birth: August 29, 1960  [facility]) CM/SW Contact:    Pollie Friar, RN Phone Number: 09/05/2018, 3:12 PM  Clinical Narrative:                 Pt has been active with Outpatient rehab on [address] and would like to continue with them. Cm called and verified this with [employer]. Orders in Epic and information on the AVS. Pt states she has transportation and denies issue with her meds.  TOC following for further d/c needs.   Expected Discharge Plan: OP Rehab Barriers to Discharge: Continued Medical Work up   Patient Goals and CMS Choice     Choice offered to / list presented to : Patient  Expected Discharge Plan and Services Expected Discharge Plan: OP Rehab   Discharge Planning Services: CM Consult                                          Prior Living Arrangements/Services   Lives with:: Roommate Patient language and need for interpreter reviewed:: Yes(no needs) Do you feel safe going back to the place where you live?: Yes      Need for Family Participation in Patient Care: Yes (Comment)(supervision for mobility) Care giver support system in place?: Yes (comment)(pt states roommate is available for supervision with mobility)   Criminal Activity/Legal Involvement Pertinent to Current Situation/Hospitalization: No - Comment as needed  Activities of Daily Living      Permission Sought/Granted                  Emotional Assessment Appearance:: Appears stated age Attitude/Demeanor/Rapport: Engaged Affect (typically observed): Accepting, Pleasant Orientation: : Oriented to Self, Oriented to Place, Oriented to  Time, Oriented to Situation   Psych Involvement: No (comment)  Admission diagnosis:  headache weakness Patient Active Problem List   Diagnosis Date Noted  . Transient neurological symptoms 09/05/2018  .  Chronic migraine 11/02/2017  . COPD (chronic obstructive pulmonary disease) ([place]) 06/09/2016  . CAD (coronary artery disease) 06/09/2016  . Osteoarthritis of left knee 11/13/2013  . Knee osteoarthritis 11/13/2013  . HTN (hypertension) 10/02/2012  . Hypokalemia 10/02/2012  . Chronic headaches 07/25/2012  . Substance addiction recovering 07/25/2012  . Dyspnea 10/06/2011  . Tobacco abuse 10/06/2011  . KNEE PAIN, LEFT 01/13/2010  . PTSD 08/10/2007   PCP:  Kristie Cowman, MD Pharmacy:   [id number] [address], [place] [address] Phone: [phone] Fax: [phone]     Social Determinants of Health (SDOH) Interventions    Readmission Risk Interventions No flowsheet data found.

## 2018-09-11 ENCOUNTER — Encounter: Payer: Self-pay | Admitting: Adult Health

## 2018-09-11 ENCOUNTER — Telehealth: Payer: Self-pay | Admitting: Adult Health

## 2018-09-11 ENCOUNTER — Ambulatory Visit: Payer: Medicaid Other | Admitting: Adult Health

## 2018-09-11 ENCOUNTER — Other Ambulatory Visit: Payer: Self-pay

## 2018-09-11 VITALS — BP 121/86 | HR 89 | Temp 96.9°F | Wt 228.2 lb

## 2018-09-11 DIAGNOSIS — I10 Essential (primary) hypertension: Secondary | ICD-10-CM

## 2018-09-11 DIAGNOSIS — G4489 Other headache syndrome: Secondary | ICD-10-CM | POA: Diagnosis not present

## 2018-09-11 DIAGNOSIS — Z8673 Personal history of transient ischemic attack (TIA), and cerebral infarction without residual deficits: Secondary | ICD-10-CM | POA: Diagnosis not present

## 2018-09-11 DIAGNOSIS — M542 Cervicalgia: Secondary | ICD-10-CM

## 2018-09-11 DIAGNOSIS — E785 Hyperlipidemia, unspecified: Secondary | ICD-10-CM

## 2018-09-11 DIAGNOSIS — G43111 Migraine with aura, intractable, with status migrainosus: Secondary | ICD-10-CM | POA: Diagnosis not present

## 2018-09-11 MED ORDER — PREGABALIN 50 MG PO CAPS: 50.0000 mg | ORAL_CAPSULE | Freq: Two times a day (BID) | ORAL | 2 refills | Status: DC

## 2018-09-11 NOTE — Patient Instructions (Addendum)
Your Plan:  Start Lyrica 50 mg twice daily  You will be called to schedule MRI cervical spine to assess for any potential nerve impingement  Continue Topamax at this time but will likely need to decrease dose in the next couple weeks due to complaints of memory loss which can be a side effect of this medication  If no benefit with Lyrica, we can consider use of occipital nerve blocks  Due to old strokes on imaging, it would not be recommended for use of Imitrex and discontinue use of ibuprofen    Follow-up in 2 months or call earlier if needed       Thank you for coming to see [place] at [facility]. I hope we have been able to provide you high quality care today.  You may receive a patient satisfaction survey over the next few weeks. We would appreciate your feedback and comments so that we may continue to improve ourselves and the health of our patients.
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
## 2018-09-11 NOTE — Telephone Encounter (Signed)
Medicaid order sent to GI. They will obtain the auth and reach out to the patient to schedule.  

## 2018-09-13 ENCOUNTER — Telehealth: Payer: Self-pay | Admitting: *Deleted

## 2018-09-13 NOTE — Progress Notes (Signed)
I have reviewed and agreed above plan. 

## 2018-09-15 ENCOUNTER — Encounter: Payer: Medicaid Other | Admitting: Physical Therapy

## 2018-09-19 NOTE — Telephone Encounter (Signed)
Would you be able to call patient regarding prior use of duloxetine or gabapentin.  I believe she told me during visit that she has trialed gabapentin in the past without benefit unable to tolerate but does not appear as though I documented this.  If she has not trialed in the past, it will be recommended to trial gabapentin as insurance will not approve pregabalin.  If she has trialed in the past, it would be recommended to trial duloxetine per insurance recommendations.

## 2018-09-19 NOTE — Telephone Encounter (Signed)
[employer] [facility] denied pregabalin for pt.  [phone].

## 2018-09-19 NOTE — Telephone Encounter (Signed)
Are we able to determine why this was denied?

## 2018-09-19 NOTE — Telephone Encounter (Signed)
I received letter from [place], [place] tracks.  Stated that pt is required to try and fail 2 preferred agents (when available) same class.  Duloxetine and gabapentin.

## 2018-09-20 ENCOUNTER — Telehealth: Payer: Self-pay | Admitting: Physical Therapy

## 2018-09-20 ENCOUNTER — Ambulatory Visit: Payer: Medicaid Other | Admitting: Physical Therapy

## 2018-09-20 NOTE — Telephone Encounter (Signed)
LVM regarding missed appointment today. Noted when her next scheduled appointment is and if she is unable to attend to please call and we can cancel or reschedule that appointment for her.

## 2018-09-20 NOTE — Telephone Encounter (Signed)
I called pt and relayed that per medicaid, denied for pregabalin (lyrica) since have not tried gabapentin or duloxetine.  I LMVM for her if has tried these previously.  She is to call back and let me know.

## 2018-09-23 ENCOUNTER — Other Ambulatory Visit: Payer: Medicaid Other

## 2018-10-03 ENCOUNTER — Ambulatory Visit: Payer: Medicaid Other | Admitting: Physical Therapy

## 2018-10-04 ENCOUNTER — Ambulatory Visit
Admission: RE | Admit: 2018-10-04 | Discharge: 2018-10-04 | Disposition: A | Discharge status: Home / Self Care | Payer: Medicaid Other | Source: Ambulatory Visit | Attending: Adult Health | Admitting: Adult Health

## 2018-10-05 ENCOUNTER — Ambulatory Visit: Payer: Medicaid Other | Admitting: Physical Therapy

## 2018-10-05 ENCOUNTER — Telehealth: Payer: Self-pay | Admitting: Adult Health

## 2018-10-05 NOTE — Telephone Encounter (Signed)
Pt has called stating she has been told by GI that the results have been read for her MRI and that she needed to call the office for the results.  Pt was told that once the results are available and have been read either the provider or the RN will call to discuss the results of the MRI.  Please call once results are available.

## 2018-10-09 ENCOUNTER — Telehealth: Payer: Self-pay | Admitting: *Deleted

## 2018-10-09 ENCOUNTER — Encounter: Payer: Medicaid Other | Admitting: Physical Therapy

## 2018-10-09 NOTE — Telephone Encounter (Signed)
Called pt and asked who her orthoped doctor is and she said Dr. Mardelle Matte at [facility]. I will send to Dr Mardelle Matte per pt request.

## 2018-10-12 ENCOUNTER — Ambulatory Visit: Payer: Medicaid Other | Admitting: Physical Therapy

## 2018-10-31 ENCOUNTER — Emergency Department (HOSPITAL_COMMUNITY): Payer: Medicaid Other

## 2018-10-31 ENCOUNTER — Emergency Department (HOSPITAL_COMMUNITY)
Admission: EM | Admit: 2018-10-31 | Discharge: 2018-10-31 | Disposition: A | Discharge status: Home / Self Care | Payer: Medicaid Other | Attending: Emergency Medicine | Admitting: Emergency Medicine

## 2018-10-31 ENCOUNTER — Other Ambulatory Visit: Payer: Self-pay

## 2018-10-31 ENCOUNTER — Encounter (HOSPITAL_COMMUNITY): Payer: Self-pay

## 2018-10-31 DIAGNOSIS — Y999 Unspecified external cause status: Secondary | ICD-10-CM | POA: Insufficient documentation

## 2018-10-31 DIAGNOSIS — Z79899 Other long term (current) drug therapy: Secondary | ICD-10-CM | POA: Diagnosis not present

## 2018-10-31 DIAGNOSIS — I10 Essential (primary) hypertension: Secondary | ICD-10-CM | POA: Insufficient documentation

## 2018-10-31 DIAGNOSIS — I2510 Atherosclerotic heart disease of native coronary artery without angina pectoris: Secondary | ICD-10-CM | POA: Diagnosis not present

## 2018-10-31 DIAGNOSIS — Z7982 Long term (current) use of aspirin: Secondary | ICD-10-CM | POA: Insufficient documentation

## 2018-10-31 DIAGNOSIS — S82852A Displaced trimalleolar fracture of left lower leg, initial encounter for closed fracture: Secondary | ICD-10-CM | POA: Diagnosis not present

## 2018-10-31 DIAGNOSIS — S8992XA Unspecified injury of left lower leg, initial encounter: Secondary | ICD-10-CM | POA: Diagnosis present

## 2018-10-31 DIAGNOSIS — J45909 Unspecified asthma, uncomplicated: Secondary | ICD-10-CM | POA: Insufficient documentation

## 2018-10-31 DIAGNOSIS — Y929 Unspecified place or not applicable: Secondary | ICD-10-CM | POA: Diagnosis not present

## 2018-10-31 DIAGNOSIS — Y939 Activity, unspecified: Secondary | ICD-10-CM | POA: Insufficient documentation

## 2018-10-31 DIAGNOSIS — J449 Chronic obstructive pulmonary disease, unspecified: Secondary | ICD-10-CM | POA: Insufficient documentation

## 2018-10-31 DIAGNOSIS — W19XXXA Unspecified fall, initial encounter: Secondary | ICD-10-CM | POA: Diagnosis not present

## 2018-10-31 MED ORDER — LIDOCAINE HCL (PF) 1 % IJ SOLN
5.0000 mL | Freq: Once | INTRAMUSCULAR | Status: AC
  Administered 2018-10-31: 5 mL
  Filled 2018-10-31: qty 30

## 2018-10-31 MED ORDER — FENTANYL CITRATE (PF) 100 MCG/2ML IJ SOLN
50.0000 ug | Freq: Once | INTRAMUSCULAR | Status: AC
  Administered 2018-10-31: 23:00:00 50 ug via INTRAVENOUS
  Filled 2018-10-31: qty 2

## 2018-10-31 MED ORDER — OXYCODONE HCL 5 MG PO TABS: 5.0000 mg | ORAL_TABLET | Freq: Four times a day (QID) | ORAL | 0 refills | Status: DC | PRN

## 2018-10-31 MED ORDER — PROPOFOL 10 MG/ML IV BOLUS
INTRAVENOUS | Status: AC | PRN
  Administered 2018-10-31: 80 mg via INTRAVENOUS

## 2018-10-31 MED ORDER — FENTANYL CITRATE (PF) 100 MCG/2ML IJ SOLN
100.0000 ug | Freq: Once | INTRAMUSCULAR | Status: AC
  Administered 2018-10-31: 100 ug via INTRAVENOUS
  Filled 2018-10-31: qty 2

## 2018-10-31 MED ORDER — PROPOFOL 10 MG/ML IV BOLUS
INTRAVENOUS | Status: AC | PRN
  Administered 2018-10-31 (×4): 20 mg via INTRAVENOUS

## 2018-10-31 MED ORDER — SODIUM CHLORIDE 0.9 % IV BOLUS
1000.0000 mL | Freq: Once | INTRAVENOUS | Status: AC
  Administered 2018-10-31: 1000 mL via INTRAVENOUS

## 2018-10-31 MED ORDER — FENTANYL CITRATE (PF) 100 MCG/2ML IJ SOLN
50.0000 ug | Freq: Once | INTRAMUSCULAR | Status: AC
  Administered 2018-10-31: 50 ug via INTRAVENOUS
  Filled 2018-10-31: qty 2

## 2018-10-31 MED ORDER — PROPOFOL 10 MG/ML IV BOLUS
INTRAVENOUS | Status: AC | PRN
  Administered 2018-10-31: 20 mg via INTRAVENOUS

## 2018-10-31 MED ORDER — PROPOFOL 10 MG/ML IV BOLUS
1.0000 mg/kg | Freq: Once | INTRAVENOUS | Status: AC
  Administered 2018-10-31: 75.6 mg via INTRAVENOUS
  Filled 2018-10-31: qty 20

## 2018-10-31 MED ORDER — ONDANSETRON HCL 4 MG/2ML IJ SOLN
4.0000 mg | Freq: Once | INTRAMUSCULAR | Status: AC
  Administered 2018-10-31: 4 mg via INTRAVENOUS
  Filled 2018-10-31: qty 2

## 2018-10-31 NOTE — ED Notes (Addendum)
Ortho and radiology contacted

## 2018-10-31 NOTE — Sedation Documentation (Signed)
2118 - Xray at bedside.

## 2018-10-31 NOTE — ED Notes (Signed)
Pt requesting pain medication. MD made aware. 

## 2018-10-31 NOTE — ED Notes (Signed)
Patient transported to X-ray 

## 2018-10-31 NOTE — ED Notes (Signed)
Sherrie (sister) [phone]

## 2018-10-31 NOTE — Sedation Documentation (Signed)
2100 - 100 mcg fentanyl administered. 2102 - MD stated conscious sedation needed due to pt unable to tolerate procedure. 2103 - Respiratory called for sedation  2109 - Bolus started and zofran administered

## 2018-10-31 NOTE — ED Notes (Addendum)
Ortho contacted to assist in demonstrating proper use of crutches

## 2018-10-31 NOTE — ED Notes (Signed)
Pt was verbalized discharge instructions. Pt had no further questions at this time. NAD. 

## 2018-10-31 NOTE — Sedation Documentation (Signed)
X-ray at bedside

## 2018-10-31 NOTE — Discharge Instructions (Addendum)
Use Tylenol, Motrin for pain.  Use Roxicodone for breakthrough pain.  No weightbearing until you follow-up with orthopedics.
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
## 2018-10-31 NOTE — ED Triage Notes (Addendum)
Pt arrives from home with [facility] after a fall. Pt is in a c-collar, has injury/deformity to the right lower leg. Pt received 20 mg Ketamine, 100 mcg Fentanyl en route.

## 2018-11-03 ENCOUNTER — Other Ambulatory Visit: Payer: Self-pay | Admitting: Orthopedic Surgery

## 2018-11-03 DIAGNOSIS — M25572 Pain in left ankle and joints of left foot: Secondary | ICD-10-CM

## 2018-11-10 ENCOUNTER — Other Ambulatory Visit: Payer: Self-pay

## 2018-11-10 ENCOUNTER — Ambulatory Visit
Admission: RE | Admit: 2018-11-10 | Discharge: 2018-11-10 | Disposition: A | Discharge status: Home / Self Care | Payer: Medicaid Other | Source: Ambulatory Visit | Attending: Orthopedic Surgery | Admitting: Orthopedic Surgery

## 2018-11-10 DIAGNOSIS — M25572 Pain in left ankle and joints of left foot: Secondary | ICD-10-CM

## 2018-11-10 DIAGNOSIS — M25511 Pain in right shoulder: Secondary | ICD-10-CM

## 2018-11-11 ENCOUNTER — Inpatient Hospital Stay: Admission: RE | Admit: 2018-11-11 | Payer: Medicaid Other | Source: Ambulatory Visit

## 2018-11-15 ENCOUNTER — Ambulatory Visit: Payer: Medicaid Other | Admitting: Adult Health

## 2018-11-28 ENCOUNTER — Telehealth: Payer: Self-pay | Admitting: *Deleted

## 2018-11-28 NOTE — Telephone Encounter (Signed)
Wrong chart

## 2018-12-06 ENCOUNTER — Other Ambulatory Visit: Payer: Self-pay

## 2018-12-06 ENCOUNTER — Encounter (HOSPITAL_COMMUNITY): Payer: Self-pay | Admitting: Emergency Medicine

## 2018-12-06 ENCOUNTER — Emergency Department (HOSPITAL_COMMUNITY)
Admission: EM | Admit: 2018-12-06 | Discharge: 2018-12-07 | Disposition: A | Discharge status: Home / Self Care | Payer: Medicaid Other | Attending: Emergency Medicine | Admitting: Emergency Medicine

## 2018-12-06 ENCOUNTER — Emergency Department (HOSPITAL_COMMUNITY): Payer: Medicaid Other

## 2018-12-06 DIAGNOSIS — Z79899 Other long term (current) drug therapy: Secondary | ICD-10-CM | POA: Insufficient documentation

## 2018-12-06 DIAGNOSIS — Z20828 Contact with and (suspected) exposure to other viral communicable diseases: Secondary | ICD-10-CM | POA: Diagnosis not present

## 2018-12-06 DIAGNOSIS — F17210 Nicotine dependence, cigarettes, uncomplicated: Secondary | ICD-10-CM | POA: Insufficient documentation

## 2018-12-06 DIAGNOSIS — I10 Essential (primary) hypertension: Secondary | ICD-10-CM | POA: Insufficient documentation

## 2018-12-06 DIAGNOSIS — R079 Chest pain, unspecified: Secondary | ICD-10-CM

## 2018-12-06 DIAGNOSIS — J45909 Unspecified asthma, uncomplicated: Secondary | ICD-10-CM | POA: Diagnosis not present

## 2018-12-06 DIAGNOSIS — R0789 Other chest pain: Secondary | ICD-10-CM | POA: Diagnosis present

## 2018-12-06 DIAGNOSIS — Z7982 Long term (current) use of aspirin: Secondary | ICD-10-CM | POA: Diagnosis not present

## 2018-12-06 LAB — BASIC METABOLIC PANEL
Anion gap: 16 — ABNORMAL HIGH (ref 5–15)
BUN: <5 mg/dL — ABNORMAL LOW (ref 6–20)
CO2: 20 mmol/L — ABNORMAL LOW (ref 22–32)
Calcium: 9.4 mg/dL (ref 8.9–10.3)
Chloride: 103 mmol/L (ref 98–111)
Creatinine, Ser: 0.62 mg/dL (ref 0.44–1.00)
GFR calc Af Amer: >60 mL/min (ref >60)
GFR calc non Af Amer: >60 mL/min (ref >60)
Glucose, Bld: 104 mg/dL — ABNORMAL HIGH (ref 70–99)
Potassium: 2.9 mmol/L — ABNORMAL LOW (ref 3.5–5.1)
Sodium: 139 mmol/L (ref 135–145)

## 2018-12-06 LAB — CBC
HCT: 37.2 % (ref 36.0–46.0)
Hemoglobin: 12.1 g/dL (ref 12.0–15.0)
MCH: 29.5 pg (ref 26.0–34.0)
MCHC: 32.5 g/dL (ref 30.0–36.0)
MCV: 90.7 fL (ref 80.0–100.0)
Platelets: 248 10*3/uL (ref 150–400)
RBC: 4.1 MIL/uL (ref 3.87–5.11)
RDW: 11.9 % (ref 11.5–15.5)
WBC: 7.5 10*3/uL (ref 4.0–10.5)
nRBC: 0 % (ref 0.0–0.2)

## 2018-12-06 LAB — I-STAT BETA HCG BLOOD, ED (MC, WL, AP ONLY): I-stat hCG, quantitative: <5 m[IU]/mL (ref <5)

## 2018-12-06 LAB — TROPONIN I (HIGH SENSITIVITY): Troponin I (High Sensitivity): 3 ng/L (ref <18)

## 2018-12-06 MED ORDER — POTASSIUM CHLORIDE CRYS ER 20 MEQ PO TBCR
40.0000 meq | EXTENDED_RELEASE_TABLET | Freq: Once | ORAL | Status: AC
  Administered 2018-12-06: 40 meq via ORAL
  Filled 2018-12-06: qty 2

## 2018-12-06 MED ORDER — NITROGLYCERIN 0.4 MG SL SUBL
0.4000 mg | SUBLINGUAL_TABLET | SUBLINGUAL | Status: DC | PRN
  Administered 2018-12-06: 0.4 mg via SUBLINGUAL
  Filled 2018-12-06: qty 1

## 2018-12-06 MED ORDER — SODIUM CHLORIDE 0.9% FLUSH: 3.0000 mL | Freq: Once | INTRAVENOUS | Status: DC
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
## 2018-12-06 NOTE — ED Triage Notes (Signed)
BIB EMS from home. Pt reports central CP with radiation into back onset PTA. Reports SOB/dizziness. Given 324 ASA, 1NTG en route taking pain 10/10 to 7/10. VSS.

## 2018-12-07 ENCOUNTER — Emergency Department (HOSPITAL_COMMUNITY): Payer: Medicaid Other

## 2018-12-07 LAB — D-DIMER, QUANTITATIVE: D-Dimer, Quant: 0.61 ug/mL-FEU — ABNORMAL HIGH (ref 0.00–0.50)

## 2018-12-07 LAB — SARS CORONAVIRUS 2 (TAT 6-24 HRS): SARS Coronavirus 2: NEGATIVE

## 2018-12-07 LAB — TROPONIN I (HIGH SENSITIVITY): Troponin I (High Sensitivity): 4 ng/L (ref <18)

## 2018-12-07 MED ORDER — ONDANSETRON HCL 4 MG/2ML IJ SOLN
4.0000 mg | Freq: Once | INTRAMUSCULAR | Status: AC
  Administered 2018-12-07: 4 mg via INTRAVENOUS
  Filled 2018-12-07: qty 2

## 2018-12-07 MED ORDER — PANTOPRAZOLE SODIUM 40 MG PO TBEC
40.0000 mg | DELAYED_RELEASE_TABLET | Freq: Once | ORAL | Status: AC
  Administered 2018-12-07: 02:00:00 40 mg via ORAL
  Filled 2018-12-07: qty 1

## 2018-12-07 MED ORDER — IOHEXOL 350 MG/ML SOLN
75.0000 mL | Freq: Once | INTRAVENOUS | Status: AC | PRN
  Administered 2018-12-07: 75 mL via INTRAVENOUS

## 2018-12-07 MED ORDER — SODIUM CHLORIDE 0.9 % IV BOLUS (SEPSIS)
1000.0000 mL | Freq: Once | INTRAVENOUS | Status: AC
  Administered 2018-12-07: 1000 mL via INTRAVENOUS

## 2018-12-07 MED ORDER — LIDOCAINE VISCOUS HCL 2 % MT SOLN
15.0000 mL | Freq: Once | OROMUCOSAL | Status: AC
  Administered 2018-12-07: 02:00:00 15 mL via ORAL
  Filled 2018-12-07: qty 15

## 2018-12-07 MED ORDER — ALUM & MAG HYDROXIDE-SIMETH 200-200-20 MG/5ML PO SUSP
30.0000 mL | Freq: Once | ORAL | Status: AC
  Administered 2018-12-07: 02:00:00 30 mL via ORAL
  Filled 2018-12-07: qty 30

## 2018-12-07 NOTE — Discharge Instructions (Addendum)
Your cardiac labs today were reassuring.  Your EKG showed no new changes.  Your CT scan showed no blood clots.  Your pain is likely GI in nature.  Please call Dr. Terrence Dupont in the morning to schedule close outpatient follow-up.  If you develop return of your chest pain, shortness of breath, please return the emergency department.  Please continue your Protonix as prescribed.

## 2019-02-06 ENCOUNTER — Other Ambulatory Visit (HOSPITAL_COMMUNITY): Payer: Self-pay | Admitting: Gastroenterology

## 2019-02-06 DIAGNOSIS — K449 Diaphragmatic hernia without obstruction or gangrene: Secondary | ICD-10-CM

## 2019-02-06 DIAGNOSIS — K219 Gastro-esophageal reflux disease without esophagitis: Secondary | ICD-10-CM

## 2019-02-09 ENCOUNTER — Ambulatory Visit (HOSPITAL_COMMUNITY): Payer: Medicaid Other

## 2019-02-14 ENCOUNTER — Ambulatory Visit (HOSPITAL_COMMUNITY)
Admission: RE | Admit: 2019-02-14 | Discharge: 2019-02-14 | Disposition: A | Discharge status: Home / Self Care | Payer: Medicaid Other | Source: Ambulatory Visit | Attending: Gastroenterology | Admitting: Gastroenterology

## 2019-02-14 ENCOUNTER — Other Ambulatory Visit: Payer: Self-pay

## 2019-02-14 DIAGNOSIS — K449 Diaphragmatic hernia without obstruction or gangrene: Secondary | ICD-10-CM | POA: Diagnosis present

## 2019-02-14 DIAGNOSIS — K219 Gastro-esophageal reflux disease without esophagitis: Secondary | ICD-10-CM | POA: Diagnosis not present

## 2019-03-03 ENCOUNTER — Other Ambulatory Visit: Payer: Self-pay

## 2019-03-03 ENCOUNTER — Emergency Department (HOSPITAL_COMMUNITY)
Admission: EM | Admit: 2019-03-03 | Discharge: 2019-03-03 | Disposition: A | Discharge status: Home / Self Care | Payer: Medicaid Other | Attending: Emergency Medicine | Admitting: Emergency Medicine

## 2019-03-03 ENCOUNTER — Emergency Department (HOSPITAL_COMMUNITY): Payer: Medicaid Other

## 2019-03-03 DIAGNOSIS — M25572 Pain in left ankle and joints of left foot: Secondary | ICD-10-CM | POA: Insufficient documentation

## 2019-03-03 DIAGNOSIS — F17210 Nicotine dependence, cigarettes, uncomplicated: Secondary | ICD-10-CM | POA: Diagnosis not present

## 2019-03-03 DIAGNOSIS — Z96652 Presence of left artificial knee joint: Secondary | ICD-10-CM | POA: Diagnosis not present

## 2019-03-03 DIAGNOSIS — Z79899 Other long term (current) drug therapy: Secondary | ICD-10-CM | POA: Insufficient documentation

## 2019-03-03 DIAGNOSIS — I10 Essential (primary) hypertension: Secondary | ICD-10-CM | POA: Diagnosis not present

## 2019-03-03 DIAGNOSIS — I2510 Atherosclerotic heart disease of native coronary artery without angina pectoris: Secondary | ICD-10-CM | POA: Insufficient documentation

## 2019-03-03 DIAGNOSIS — Z7982 Long term (current) use of aspirin: Secondary | ICD-10-CM | POA: Diagnosis not present

## 2019-03-03 DIAGNOSIS — J449 Chronic obstructive pulmonary disease, unspecified: Secondary | ICD-10-CM | POA: Insufficient documentation

## 2019-03-03 DIAGNOSIS — G8929 Other chronic pain: Secondary | ICD-10-CM

## 2019-03-03 MED ORDER — HYDROCODONE-ACETAMINOPHEN 5-325 MG PO TABS
1.0000 | ORAL_TABLET | Freq: Once | ORAL | Status: AC
  Administered 2019-03-03: 1 via ORAL
  Filled 2019-03-03: qty 1
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
## 2019-03-03 NOTE — Discharge Instructions (Addendum)
Please return for any problem.  Follow-up with your regular care provider as instructed -- follow-up with Dr. Susa Simmonds of orthopedics as instructed.

## 2019-03-03 NOTE — ED Triage Notes (Signed)
Patient reports she fell down steps on 10/31/18, broke left lower leg. Patient states she still has pain. She called her pcp and was instructed to come to ED for CT scan. Patient states PCP told her the CT may reveal something that PCP is not seeing.

## 2019-03-26 ENCOUNTER — Other Ambulatory Visit: Payer: Self-pay | Admitting: Orthopaedic Surgery

## 2019-04-05 NOTE — Progress Notes (Signed)
Your procedure is scheduled on Tuesday April 10, 2019.  Report to [facility] Main Entrance "A" at 05:30 A.M., and check in at the Admitting office.  Call this number if you have problems the morning of surgery:  [phone]  Call [phone] if you have any questions prior to your surgery date Monday-Friday 8am-4pm    Remember: Do not eat after midnight the night before your surgery  You may drink clear liquids until 04:30 AM the morning of your surgery.   Clear liquids allowed are: Water, Non-Citrus Juices (without pulp), Carbonated Beverages, Clear Tea, Black Coffee Only, and Gatorade    Take these medicines the morning of surgery with A SIP OF WATER: amLODipine (NORVASC) atorvastatin (LIPITOR) metoprolol succinate (TOPROL-XL)  pantoprazole (PROTONIX)   Take as needed:  albuterol (PROAIR HFA)  budesonide-formoterol (SYMBICORT) EPINEPHrine 0.3 mg/0.3 mL  nitroGLYCERIN (NITROSTAT)  pregabalin (LYRICA)   Please bring all inhalers with you the day of surgery.   >>Follow your surgeon's instructions on when to stop Aspirin.  If no instructions were given by your surgeon then you will need to call the office to get those instructions.     As of today, STOP taking any Aleve, Naproxen, Ibuprofen, Motrin, Advil, Goody's, BC's, all herbal medications, fish oil, and all vitamins.    The Morning of Surgery  Do not wear jewelry, make-up or nail polish.  Do not wear lotions, powders, perfumes, or deodorant  Do not shave 48 hours prior to surgery.   Do not bring valuables to the hospital.  [facility] is not responsible for any belongings or valuables.  If you are a smoker, DO NOT Smoke 24 hours prior to surgery  If you wear a CPAP at night please bring your mask the morning of surgery   Remember that you must have someone to transport you home after your surgery, and remain with you for 24 hours if you are discharged the same day.   Please bring cases for contacts, glasses,  hearing aids, dentures or bridgework because it cannot be worn into surgery.    Leave your suitcase in the car.  After surgery it may be brought to your room.  For patients admitted to the hospital, discharge time will be determined by your treatment team.  Patients discharged the day of surgery will not be allowed to drive home.    Special instructions:   [place]- Preparing For Surgery  Before surgery, you can play an important role. Because skin is not sterile, your skin needs to be as free of germs as possible. You can reduce the number of germs on your skin by washing with CHG (chlorahexidine gluconate) Soap before surgery.  CHG is an antiseptic cleaner which kills germs and bonds with the skin to continue killing germs even after washing.    Oral Hygiene is also important to reduce your risk of infection.  Remember - BRUSH YOUR TEETH THE MORNING OF SURGERY WITH YOUR REGULAR TOOTHPASTE  Please do not use if you have an allergy to CHG or antibacterial soaps. If your skin becomes reddened/irritated stop using the CHG.  Do not shave (including legs and underarms) for at least 48 hours prior to first CHG shower. It is OK to shave your face.  Please follow these instructions carefully.   1. Shower the NIGHT BEFORE SURGERY and the MORNING OF SURGERY with CHG Soap.   2. If you chose to wash your hair, wash your hair first as usual with your normal shampoo.  3. After you shampoo, rinse your hair and body thoroughly to remove the shampoo.  4. Use CHG as you would any other liquid soap. You can apply CHG directly to the skin and wash gently with a scrungie or a clean washcloth.   5. Apply the CHG Soap to your body ONLY FROM THE NECK DOWN.  Do not use on open wounds or open sores. Avoid contact with your eyes, ears, mouth and genitals (private parts). Wash Face and genitals (private parts)  with your normal soap.   6. Wash thoroughly, paying special attention to the area where your  surgery will be performed.  7. Thoroughly rinse your body with warm water from the neck down.  8. DO NOT shower/wash with your normal soap after using and rinsing off the CHG Soap.  9. Pat yourself dry with a CLEAN TOWEL.  10. Wear CLEAN PAJAMAS to bed the night before surgery, wear comfortable clothes the morning of surgery  11. Place CLEAN SHEETS on your bed the night of your first shower and DO NOT SLEEP WITH PETS.    Day of Surgery:  Please shower the morning of surgery with the CHG soap Do not apply any deodorants/lotions. Please wear clean clothes to the hospital/surgery center.   Remember to brush your teeth WITH YOUR REGULAR TOOTHPASTE.   Please read over the following fact sheets that you were given.

## 2019-04-06 ENCOUNTER — Encounter (HOSPITAL_COMMUNITY): Payer: Self-pay

## 2019-04-06 ENCOUNTER — Other Ambulatory Visit (HOSPITAL_COMMUNITY)
Admission: RE | Admit: 2019-04-06 | Discharge: 2019-04-06 | Disposition: A | Discharge status: Home / Self Care | Payer: Medicaid Other | Source: Ambulatory Visit | Attending: Orthopaedic Surgery | Admitting: Orthopaedic Surgery

## 2019-04-06 ENCOUNTER — Encounter (HOSPITAL_COMMUNITY)
Admission: RE | Admit: 2019-04-06 | Discharge: 2019-04-06 | Disposition: A | Discharge status: Home / Self Care | Payer: Medicaid Other | Source: Ambulatory Visit | Attending: Orthopaedic Surgery | Admitting: Orthopaedic Surgery

## 2019-04-06 ENCOUNTER — Other Ambulatory Visit: Payer: Self-pay

## 2019-04-06 DIAGNOSIS — F419 Anxiety disorder, unspecified: Secondary | ICD-10-CM | POA: Insufficient documentation

## 2019-04-06 DIAGNOSIS — S82852A Displaced trimalleolar fracture of left lower leg, initial encounter for closed fracture: Secondary | ICD-10-CM | POA: Insufficient documentation

## 2019-04-06 DIAGNOSIS — K219 Gastro-esophageal reflux disease without esophagitis: Secondary | ICD-10-CM | POA: Diagnosis not present

## 2019-04-06 DIAGNOSIS — Z7982 Long term (current) use of aspirin: Secondary | ICD-10-CM | POA: Diagnosis not present

## 2019-04-06 DIAGNOSIS — Z7951 Long term (current) use of inhaled steroids: Secondary | ICD-10-CM | POA: Insufficient documentation

## 2019-04-06 DIAGNOSIS — X58XXXA Exposure to other specified factors, initial encounter: Secondary | ICD-10-CM | POA: Diagnosis not present

## 2019-04-06 DIAGNOSIS — Z01812 Encounter for preprocedural laboratory examination: Secondary | ICD-10-CM | POA: Insufficient documentation

## 2019-04-06 DIAGNOSIS — J45909 Unspecified asthma, uncomplicated: Secondary | ICD-10-CM | POA: Diagnosis not present

## 2019-04-06 DIAGNOSIS — Z20822 Contact with and (suspected) exposure to covid-19: Secondary | ICD-10-CM | POA: Diagnosis not present

## 2019-04-06 DIAGNOSIS — I2510 Atherosclerotic heart disease of native coronary artery without angina pectoris: Secondary | ICD-10-CM | POA: Diagnosis not present

## 2019-04-06 DIAGNOSIS — Z79899 Other long term (current) drug therapy: Secondary | ICD-10-CM | POA: Insufficient documentation

## 2019-04-06 LAB — COMPREHENSIVE METABOLIC PANEL
ALT: 14 U/L (ref 0–44)
AST: 18 U/L (ref 15–41)
Albumin: 3.8 g/dL (ref 3.5–5.0)
Alkaline Phosphatase: 82 U/L (ref 38–126)
Anion gap: 10 (ref 5–15)
BUN: 7 mg/dL (ref 6–20)
CO2: 26 mmol/L (ref 22–32)
Calcium: 8.9 mg/dL (ref 8.9–10.3)
Chloride: 104 mmol/L (ref 98–111)
Creatinine, Ser: 0.61 mg/dL (ref 0.44–1.00)
GFR calc Af Amer: >60 mL/min (ref >60)
GFR calc non Af Amer: >60 mL/min (ref >60)
Glucose, Bld: 99 mg/dL (ref 70–99)
Potassium: 3.2 mmol/L — ABNORMAL LOW (ref 3.5–5.1)
Sodium: 140 mmol/L (ref 135–145)
Total Bilirubin: 0.8 mg/dL (ref 0.3–1.2)
Total Protein: 6.3 g/dL — ABNORMAL LOW (ref 6.5–8.1)

## 2019-04-06 LAB — CBC
HCT: 38.1 % (ref 36.0–46.0)
Hemoglobin: 12 g/dL (ref 12.0–15.0)
MCH: 30 pg (ref 26.0–34.0)
MCHC: 31.5 g/dL (ref 30.0–36.0)
MCV: 95.3 fL (ref 80.0–100.0)
Platelets: 250 10*3/uL (ref 150–400)
RBC: 4 MIL/uL (ref 3.87–5.11)
RDW: 13.9 % (ref 11.5–15.5)
WBC: 7.4 10*3/uL (ref 4.0–10.5)
nRBC: 0 % (ref 0.0–0.2)

## 2019-04-06 LAB — SARS CORONAVIRUS 2 (TAT 6-24 HRS): SARS Coronavirus 2: NEGATIVE

## 2019-04-06 NOTE — Progress Notes (Addendum)
PCP - Knox Royalty, MD Cardiologist - Sharyn Lull, MD  Chest x-ray - 12-06-18 EKG - 12-07-18 Stress Test - 2017 ECHO - 09/05/18 Cardiac Cath - 2017  Sleep Study - yes  CPAP - negative test, pt does not have CPAP    Aspirin Instructions: Follow your surgeon's instructions on when to stop Aspirin.  If no instructions were given by your surgeon then you will need to call the office to get those instructions.    (Dr. Donnie Mesa office called, spoke with Kyla Balzarine - pt does not have instructions to hold ASA for DOS)   ERAS Protcol - yes PRE-SURGERY Ensure or G2-  ensure  COVID TEST- 04/06/19 after PAT appt   Anesthesia review: yes, cardiac hx  Patient denies shortness of breath, fever, cough and chest pain at PAT appointment   All instructions explained to the patient, with a verbal understanding of the material. Patient agrees to go over the instructions while at home for a better understanding. Patient also instructed to self quarantine after being tested for COVID-19. The opportunity to ask questions was provided.

## 2019-04-09 ENCOUNTER — Encounter (HOSPITAL_COMMUNITY): Payer: Self-pay | Admitting: Orthopaedic Surgery

## 2019-04-09 ENCOUNTER — Encounter (HOSPITAL_COMMUNITY): Payer: Self-pay
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
## 2019-04-09 NOTE — Anesthesia Preprocedure Evaluation (Addendum)
Anesthesia Evaluation  Patient identified by MRN, date of birth, ID band Patient awake    Reviewed: Allergy & Precautions, NPO status , Patient's Chart, lab work & pertinent test results  History of Anesthesia Complications Negative for: history of anesthetic complications  Airway Mallampati: II  TM Distance: >3 FB Neck ROM: Full    Dental  (+) Missing, Dental Advisory Given,    Pulmonary asthma , COPD,  COPD inhaler, Current SmokerPatient did not abstain from smoking.,    Pulmonary exam normal        Cardiovascular hypertension, Pt. on medications and Pt. on home beta blockers + CAD (distal LAD 40% by 2017 cath)  Normal cardiovascular exam  Echo 09/05/18: EF 60-65%, mild LAE, normal valves    Neuro/Psych negative neurological ROS  negative psych ROS   GI/Hepatic Neg liver ROS, hiatal hernia, GERD  Medicated and Controlled,  Endo/Other  negative endocrine ROS  Renal/GU negative Renal ROS  negative genitourinary   Musculoskeletal  (+) Arthritis ,   Abdominal   Peds  Hematology negative hematology ROS (+)   Anesthesia Other Findings Day of surgery medications reviewed with patient.  Reproductive/Obstetrics negative OB ROS                          Anesthesia Physical Anesthesia Plan  ASA: II  Anesthesia Plan: General   Post-op Pain Management: GA combined w/ Regional for post-op pain   Induction: Intravenous  PONV Risk Score and Plan: 2 and Treatment may vary due to age or medical condition, Ondansetron, Dexamethasone and Midazolam  Airway Management Planned: Oral ETT  Additional Equipment: None  Intra-op Plan:   Post-operative Plan: Extubation in OR  Informed Consent: I have reviewed the patients History and Physical, chart, labs and discussed the procedure including the risks, benefits and alternatives for the proposed anesthesia with the patient or authorized representative who  has indicated his/her understanding and acceptance.     Dental advisory given  Plan Discussed with: CRNA  Anesthesia Plan Comments: (See APP note by Joslyn Hy, FNP)      Anesthesia Quick Evaluation

## 2019-04-10 ENCOUNTER — Ambulatory Visit (HOSPITAL_COMMUNITY): Payer: Medicaid Other

## 2019-04-10 ENCOUNTER — Ambulatory Visit (HOSPITAL_COMMUNITY): Payer: Medicaid Other | Admitting: Vascular Surgery

## 2019-04-10 ENCOUNTER — Encounter (HOSPITAL_COMMUNITY): Payer: Self-pay | Admitting: Orthopaedic Surgery

## 2019-04-10 ENCOUNTER — Ambulatory Visit (HOSPITAL_COMMUNITY)
Admission: RE | Admit: 2019-04-10 | Discharge: 2019-04-10 | Disposition: A | Discharge status: Home / Self Care | Payer: Medicaid Other | Attending: Orthopaedic Surgery | Admitting: Orthopaedic Surgery

## 2019-04-10 ENCOUNTER — Other Ambulatory Visit: Payer: Self-pay

## 2019-04-10 ENCOUNTER — Encounter (HOSPITAL_COMMUNITY)
Admission: RE | Disposition: A | Discharge status: Home / Self Care | Payer: Self-pay | Source: Home / Self Care | Attending: Orthopaedic Surgery

## 2019-04-10 ENCOUNTER — Ambulatory Visit (HOSPITAL_COMMUNITY): Payer: Medicaid Other | Admitting: Anesthesiology

## 2019-04-10 DIAGNOSIS — G43909 Migraine, unspecified, not intractable, without status migrainosus: Secondary | ICD-10-CM | POA: Diagnosis not present

## 2019-04-10 DIAGNOSIS — X58XXXD Exposure to other specified factors, subsequent encounter: Secondary | ICD-10-CM | POA: Insufficient documentation

## 2019-04-10 DIAGNOSIS — K219 Gastro-esophageal reflux disease without esophagitis: Secondary | ICD-10-CM | POA: Diagnosis not present

## 2019-04-10 DIAGNOSIS — F17210 Nicotine dependence, cigarettes, uncomplicated: Secondary | ICD-10-CM | POA: Insufficient documentation

## 2019-04-10 DIAGNOSIS — Z7982 Long term (current) use of aspirin: Secondary | ICD-10-CM | POA: Diagnosis not present

## 2019-04-10 DIAGNOSIS — J449 Chronic obstructive pulmonary disease, unspecified: Secondary | ICD-10-CM | POA: Insufficient documentation

## 2019-04-10 DIAGNOSIS — S82852K Displaced trimalleolar fracture of left lower leg, subsequent encounter for closed fracture with nonunion: Secondary | ICD-10-CM | POA: Diagnosis present

## 2019-04-10 DIAGNOSIS — Z419 Encounter for procedure for purposes other than remedying health state, unspecified: Secondary | ICD-10-CM

## 2019-04-10 DIAGNOSIS — G4730 Sleep apnea, unspecified: Secondary | ICD-10-CM | POA: Insufficient documentation

## 2019-04-10 DIAGNOSIS — F419 Anxiety disorder, unspecified: Secondary | ICD-10-CM | POA: Insufficient documentation

## 2019-04-10 DIAGNOSIS — Z79899 Other long term (current) drug therapy: Secondary | ICD-10-CM | POA: Insufficient documentation

## 2019-04-10 DIAGNOSIS — I10 Essential (primary) hypertension: Secondary | ICD-10-CM | POA: Insufficient documentation

## 2019-04-10 DIAGNOSIS — Z96652 Presence of left artificial knee joint: Secondary | ICD-10-CM | POA: Insufficient documentation

## 2019-04-10 DIAGNOSIS — Z20822 Contact with and (suspected) exposure to covid-19: Secondary | ICD-10-CM | POA: Diagnosis not present

## 2019-04-10 DIAGNOSIS — I2510 Atherosclerotic heart disease of native coronary artery without angina pectoris: Secondary | ICD-10-CM | POA: Insufficient documentation

## 2019-04-10 DIAGNOSIS — F329 Major depressive disorder, single episode, unspecified: Secondary | ICD-10-CM | POA: Diagnosis not present

## 2019-04-10 HISTORY — PX: ORIF ANKLE FRACTURE: SHX5408

## 2019-04-10 SURGERY — OPEN REDUCTION INTERNAL FIXATION (ORIF) ANKLE FRACTURE
Anesthesia: General | Site: Ankle | Laterality: Left

## 2019-04-10 MED ORDER — LACTATED RINGERS IV SOLN: INTRAVENOUS | Status: DC | PRN

## 2019-04-10 MED ORDER — DEXAMETHASONE SODIUM PHOSPHATE 10 MG/ML IJ SOLN
INTRAMUSCULAR | Status: AC
  Filled 2019-04-10: qty 1

## 2019-04-10 MED ORDER — PROPOFOL 10 MG/ML IV BOLUS
INTRAVENOUS | Status: DC | PRN
  Administered 2019-04-10: 170 mg via INTRAVENOUS
  Administered 2019-04-10: 30 mg via INTRAVENOUS

## 2019-04-10 MED ORDER — PHENYLEPHRINE 40 MCG/ML (10ML) SYRINGE FOR IV PUSH (FOR BLOOD PRESSURE SUPPORT)
PREFILLED_SYRINGE | INTRAVENOUS | Status: DC | PRN
  Administered 2019-04-10 (×3): 80 ug via INTRAVENOUS

## 2019-04-10 MED ORDER — FENTANYL CITRATE (PF) 250 MCG/5ML IJ SOLN
INTRAMUSCULAR | Status: AC
  Filled 2019-04-10: qty 5

## 2019-04-10 MED ORDER — ROCURONIUM BROMIDE 10 MG/ML (PF) SYRINGE
PREFILLED_SYRINGE | INTRAVENOUS | Status: DC | PRN
  Administered 2019-04-10: 20 mg via INTRAVENOUS
  Administered 2019-04-10: 60 mg via INTRAVENOUS
  Administered 2019-04-10: 20 mg via INTRAVENOUS

## 2019-04-10 MED ORDER — POVIDONE-IODINE 10 % EX SWAB
2.0000 "application " | Freq: Once | CUTANEOUS | Status: AC
  Administered 2019-04-10: 2 via TOPICAL

## 2019-04-10 MED ORDER — BUPIVACAINE HCL (PF) 0.25 % IJ SOLN
INTRAMUSCULAR | Status: AC
  Filled 2019-04-10: qty 30

## 2019-04-10 MED ORDER — PROMETHAZINE HCL 25 MG/ML IJ SOLN: 6.2500 mg | INTRAMUSCULAR | Status: DC | PRN

## 2019-04-10 MED ORDER — 0.9 % SODIUM CHLORIDE (POUR BTL) OPTIME
TOPICAL | Status: DC | PRN
  Administered 2019-04-10: 1000 mL

## 2019-04-10 MED ORDER — FENTANYL CITRATE (PF) 100 MCG/2ML IJ SOLN
INTRAMUSCULAR | Status: DC | PRN
  Administered 2019-04-10 (×5): 50 ug via INTRAVENOUS

## 2019-04-10 MED ORDER — PROPOFOL 10 MG/ML IV BOLUS
INTRAVENOUS | Status: AC
  Filled 2019-04-10: qty 20

## 2019-04-10 MED ORDER — OXYCODONE HCL 5 MG PO TABS
5.0000 mg | ORAL_TABLET | Freq: Once | ORAL | Status: AC | PRN
  Administered 2019-04-10: 5 mg via ORAL

## 2019-04-10 MED ORDER — MIDAZOLAM HCL 5 MG/5ML IJ SOLN
INTRAMUSCULAR | Status: DC | PRN
  Administered 2019-04-10: 2 mg via INTRAVENOUS

## 2019-04-10 MED ORDER — VANCOMYCIN HCL 500 MG IV SOLR
INTRAVENOUS | Status: DC | PRN
  Administered 2019-04-10: 500 mg

## 2019-04-10 MED ORDER — PHENYLEPHRINE 40 MCG/ML (10ML) SYRINGE FOR IV PUSH (FOR BLOOD PRESSURE SUPPORT)
PREFILLED_SYRINGE | INTRAVENOUS | Status: AC
  Filled 2019-04-10: qty 10

## 2019-04-10 MED ORDER — MIDAZOLAM HCL 2 MG/2ML IJ SOLN
INTRAMUSCULAR | Status: AC
  Filled 2019-04-10: qty 2

## 2019-04-10 MED ORDER — ONDANSETRON HCL 4 MG/2ML IJ SOLN
INTRAMUSCULAR | Status: AC
  Filled 2019-04-10: qty 2

## 2019-04-10 MED ORDER — BUPIVACAINE-EPINEPHRINE (PF) 0.5% -1:200000 IJ SOLN
INTRAMUSCULAR | Status: DC | PRN
  Administered 2019-04-10: 10 mL via PERINEURAL
  Administered 2019-04-10: 20 mL via PERINEURAL

## 2019-04-10 MED ORDER — DEXAMETHASONE SODIUM PHOSPHATE 10 MG/ML IJ SOLN
INTRAMUSCULAR | Status: DC | PRN
  Administered 2019-04-10: 10 mg via INTRAVENOUS

## 2019-04-10 MED ORDER — LIDOCAINE 2% (20 MG/ML) 5 ML SYRINGE
INTRAMUSCULAR | Status: AC
  Filled 2019-04-10: qty 5

## 2019-04-10 MED ORDER — HYDROMORPHONE HCL 1 MG/ML IJ SOLN
0.2500 mg | INTRAMUSCULAR | Status: DC | PRN
  Administered 2019-04-10: 0.5 mg via INTRAVENOUS

## 2019-04-10 MED ORDER — HYDROMORPHONE HCL 1 MG/ML IJ SOLN
INTRAMUSCULAR | Status: AC
  Filled 2019-04-10: qty 1

## 2019-04-10 MED ORDER — SUGAMMADEX SODIUM 200 MG/2ML IV SOLN
INTRAVENOUS | Status: DC | PRN
  Administered 2019-04-10: 175 mg via INTRAVENOUS

## 2019-04-10 MED ORDER — VANCOMYCIN HCL 500 MG IV SOLR
INTRAVENOUS | Status: AC
  Filled 2019-04-10: qty 500

## 2019-04-10 MED ORDER — LIDOCAINE 2% (20 MG/ML) 5 ML SYRINGE
INTRAMUSCULAR | Status: DC | PRN
  Administered 2019-04-10: 100 mg via INTRAVENOUS

## 2019-04-10 MED ORDER — BUPIVACAINE LIPOSOME 1.3 % IJ SUSP
INTRAMUSCULAR | Status: DC | PRN
  Administered 2019-04-10 (×2): 5 mL

## 2019-04-10 MED ORDER — EPINEPHRINE PF 1 MG/ML IJ SOLN
INTRAMUSCULAR | Status: AC
  Filled 2019-04-10: qty 1

## 2019-04-10 MED ORDER — CEFAZOLIN SODIUM-DEXTROSE 2-4 GM/100ML-% IV SOLN
2.0000 g | INTRAVENOUS | Status: AC
  Administered 2019-04-10: 2 g via INTRAVENOUS
  Filled 2019-04-10: qty 100

## 2019-04-10 MED ORDER — OXYCODONE HCL 5 MG PO TABS
ORAL_TABLET | ORAL | Status: AC
  Filled 2019-04-10: qty 1

## 2019-04-10 MED ORDER — OXYCODONE HCL 5 MG/5ML PO SOLN: 5.0000 mg | Freq: Once | ORAL | Status: AC | PRN

## 2019-04-10 MED ORDER — ACETAMINOPHEN 500 MG PO TABS
1000.0000 mg | ORAL_TABLET | Freq: Once | ORAL | Status: AC
  Administered 2019-04-10: 1000 mg via ORAL
  Filled 2019-04-10: qty 2

## 2019-04-10 MED ORDER — ROCURONIUM BROMIDE 10 MG/ML (PF) SYRINGE
PREFILLED_SYRINGE | INTRAVENOUS | Status: AC
  Filled 2019-04-10: qty 10

## 2019-04-10 MED ORDER — ONDANSETRON HCL 4 MG/2ML IJ SOLN
INTRAMUSCULAR | Status: DC | PRN
  Administered 2019-04-10: 4 mg via INTRAVENOUS

## 2019-04-10 SURGICAL SUPPLY — 97 items
ALCOHOL 70% 16 OZ (MISCELLANEOUS) ×2 IMPLANT
APL PRP STRL LF DISP 70% ISPRP (MISCELLANEOUS) ×4
APL SKNCLS STERI-STRIP NONHPOA (GAUZE/BANDAGES/DRESSINGS)
BANDAGE ESMARK 6X9 LF (GAUZE/BANDAGES/DRESSINGS) IMPLANT
BENZOIN TINCTURE PRP APPL 2/3 (GAUZE/BANDAGES/DRESSINGS) IMPLANT
BIT DRILL 2 CANN GRADUATED (BIT) ×2 IMPLANT
BIT DRILL 2.5 CANN LNG (BIT) ×2 IMPLANT
BIT DRILL 2.6 CANN (BIT) ×2 IMPLANT
BIT DRILL 2.7 (BIT) ×4
BIT DRILL 2.7X2.7/3XSCR ANKL (BIT) IMPLANT
BIT DRL 2.7X2.7/3XSCR ANKL (BIT) ×2
BLADE LONG MED 31MMX9MM (MISCELLANEOUS) ×1
BLADE LONG MED 31X9 (MISCELLANEOUS) ×3 IMPLANT
BLADE SURG 15 STRL LF DISP TIS (BLADE) ×4 IMPLANT
BLADE SURG 15 STRL SS (BLADE) ×20
BNDG CMPR 9X6 STRL LF SNTH (GAUZE/BANDAGES/DRESSINGS)
BNDG CMPR MED 10X6 ELC LF (GAUZE/BANDAGES/DRESSINGS) ×2
BNDG CMPR MED 15X6 ELC VLCR LF (GAUZE/BANDAGES/DRESSINGS) ×2
BNDG COHESIVE 4X5 TAN STRL (GAUZE/BANDAGES/DRESSINGS) IMPLANT
BNDG COHESIVE 6X5 TAN STRL LF (GAUZE/BANDAGES/DRESSINGS) IMPLANT
BNDG ELASTIC 6X10 VLCR STRL LF (GAUZE/BANDAGES/DRESSINGS) ×4 IMPLANT
BNDG ELASTIC 6X15 VLCR STRL LF (GAUZE/BANDAGES/DRESSINGS) ×2 IMPLANT
BNDG ESMARK 6X9 LF (GAUZE/BANDAGES/DRESSINGS)
CANISTER SUCT 3000ML PPV (MISCELLANEOUS) ×4 IMPLANT
CHLORAPREP W/TINT 26 (MISCELLANEOUS) ×8 IMPLANT
CLOSURE WOUND 1/2 X4 (GAUZE/BANDAGES/DRESSINGS)
COVER SURGICAL LIGHT HANDLE (MISCELLANEOUS) ×4 IMPLANT
COVER WAND RF STERILE (DRAPES) ×2 IMPLANT
CUFF TOURN SGL QUICK 34 (TOURNIQUET CUFF) ×4
CUFF TOURN SGL QUICK 42 (TOURNIQUET CUFF) IMPLANT
CUFF TRNQT CYL 34X4.125X (TOURNIQUET CUFF) ×2 IMPLANT
DECANTER SPIKE VIAL GLASS SM (MISCELLANEOUS) IMPLANT
DRAPE C-ARM 42X72 X-RAY (DRAPES) ×2 IMPLANT
DRAPE C-ARM MINI (DRAPES) ×2 IMPLANT
DRAPE C-ARMOR (DRAPES) ×2 IMPLANT
DRAPE OEC MINIVIEW 54X84 (DRAPES) ×2 IMPLANT
DRAPE U-SHAPE 47X51 STRL (DRAPES) ×4 IMPLANT
DRSG MEPITEL 4X7.2 (GAUZE/BANDAGES/DRESSINGS) ×4 IMPLANT
DRSG PAD ABDOMINAL 8X10 ST (GAUZE/BANDAGES/DRESSINGS) ×8 IMPLANT
DRSG XEROFORM 1X8 (GAUZE/BANDAGES/DRESSINGS) ×4 IMPLANT
ELECT REM PT RETURN 9FT ADLT (ELECTROSURGICAL) ×4
ELECTRODE REM PT RTRN 9FT ADLT (ELECTROSURGICAL) ×2 IMPLANT
GAUZE SPONGE 4X4 12PLY STRL (GAUZE/BANDAGES/DRESSINGS) ×4 IMPLANT
GAUZE SPONGE 4X4 12PLY STRL LF (GAUZE/BANDAGES/DRESSINGS) ×4 IMPLANT
GLOVE BIOGEL M STRL SZ7.5 (GLOVE) ×4 IMPLANT
GLOVE BIOGEL PI IND STRL 8 (GLOVE) ×2 IMPLANT
GLOVE BIOGEL PI INDICATOR 8 (GLOVE) ×2
GOWN STRL REUS W/ TWL LRG LVL3 (GOWN DISPOSABLE) ×2 IMPLANT
GOWN STRL REUS W/ TWL XL LVL3 (GOWN DISPOSABLE) ×2 IMPLANT
GOWN STRL REUS W/TWL LRG LVL3 (GOWN DISPOSABLE) ×4
GOWN STRL REUS W/TWL XL LVL3 (GOWN DISPOSABLE) ×4
GUIDEWIRE 1.35MM (WIRE) ×4 IMPLANT
GUIDEWIRE 1.6 (WIRE) ×8
GUIDEWIRE ORTH 157X1.6XTROC (WIRE) IMPLANT
KIT BASIN OR (CUSTOM PROCEDURE TRAY) ×4 IMPLANT
KIT TURNOVER KIT B (KITS) ×4 IMPLANT
NDL TAPERED W/ NITINOL LOOP (MISCELLANEOUS) ×2 IMPLANT
NEEDLE TAPERED W/ NITINOL LOOP (MISCELLANEOUS) ×4 IMPLANT
NS IRRIG 1000ML POUR BTL (IV SOLUTION) ×4 IMPLANT
PACK ORTHO EXTREMITY (CUSTOM PROCEDURE TRAY) ×4 IMPLANT
PAD ARMBOARD 7.5X6 YLW CONV (MISCELLANEOUS) ×8 IMPLANT
PAD CAST 4YDX4 CTTN HI CHSV (CAST SUPPLIES) ×2 IMPLANT
PADDING CAST COTTON 4X4 STRL (CAST SUPPLIES) ×4
PADDING CAST SYNTHETIC 4 (CAST SUPPLIES) ×2
PADDING CAST SYNTHETIC 4X4 STR (CAST SUPPLIES) ×2 IMPLANT
PLATE DISTAL FIBULA 4H LOCKING (Plate) ×2 IMPLANT
PUTTY DBM ALLOSYNC PURE 5CC (Putty) ×2 IMPLANT
SCREW CANCELLOUS 3MM 3X12MM (Screw) ×2 IMPLANT
SCREW CORT 2.7X30MM (Screw) ×2 IMPLANT
SCREW LOCKING 2.7X12 ANKLE (Screw) ×2 IMPLANT
SCREW LOW PROFILE 3.5X14 (Screw) ×4 IMPLANT
SCREW LOW PROFILE 3.5X16 (Screw) ×2 IMPLANT
SCREW LP CANN 4.0X45MM (Screw) ×4 IMPLANT
SPONGE LAP 18X18 RF (DISPOSABLE) ×4 IMPLANT
STRIP CLOSURE SKIN 1/2X4 (GAUZE/BANDAGES/DRESSINGS) IMPLANT
SUCTION FRAZIER HANDLE 10FR (MISCELLANEOUS) ×4
SUCTION TUBE FRAZIER 10FR DISP (MISCELLANEOUS) ×2 IMPLANT
SUT ETHILON 3 0 PS 1 (SUTURE) ×2 IMPLANT
SUT FIBERWIRE 2-0 18 17.9 3/8 (SUTURE) ×8
SUT MNCRL AB 3-0 PS2 18 (SUTURE) IMPLANT
SUT MON AB 2-0 CT1 36 (SUTURE) ×2 IMPLANT
SUT MON AB 3-0 SH 27 (SUTURE) ×12
SUT MON AB 3-0 SH27 (SUTURE) ×2 IMPLANT
SUT PDS AB 2-0 CT2 27 (SUTURE) IMPLANT
SUT VIC AB 0 CT1 27 (SUTURE)
SUT VIC AB 0 CT1 27XBRD ANBCTR (SUTURE) IMPLANT
SUT VIC AB 2-0 CT1 27 (SUTURE) ×8
SUT VIC AB 2-0 CT1 TAPERPNT 27 (SUTURE) ×4 IMPLANT
SUTURE FIBERWR 2-0 18 17.9 3/8 (SUTURE) IMPLANT
SYR 10ML LL (SYRINGE) ×2 IMPLANT
TOWEL GREEN STERILE (TOWEL DISPOSABLE) ×4 IMPLANT
TOWEL GREEN STERILE FF (TOWEL DISPOSABLE) ×8 IMPLANT
TUBE CONNECTING 12'X1/4 (SUCTIONS) ×1
TUBE CONNECTING 12X1/4 (SUCTIONS) ×3 IMPLANT
TUBE CONNECTING 20'X1/4 (TUBING) ×1
TUBE CONNECTING 20X1/4 (TUBING) ×5 IMPLANT
UNDERPAD 30X30 (UNDERPADS AND DIAPERS) ×4 IMPLANT

## 2019-04-10 NOTE — Anesthesia Procedure Notes (Signed)
Procedure Name: Intubation Date/Time: 04/10/2019 7:39 AM Performed by: Lovie Chol, CRNA Pre-anesthesia Checklist: Patient identified, Emergency Drugs available, Suction available and Patient being monitored Patient Re-evaluated:Patient Re-evaluated prior to induction Oxygen Delivery Method: Circle System Utilized Preoxygenation: Pre-oxygenation with 100% oxygen Induction Type: IV induction Ventilation: Mask ventilation without difficulty Laryngoscope Size: Miller and 3 Grade View: Grade II Tube type: Oral Tube size: 7.0 mm Number of attempts: 1 Airway Equipment and Method: Stylet and Oral airway Placement Confirmation: ETT inserted through vocal cords under direct vision,  positive ETCO2 and breath sounds checked- equal and bilateral Secured at: 21 cm Tube secured with: Tape Dental Injury: Teeth and Oropharynx as per pre-operative assessment

## 2019-04-10 NOTE — Anesthesia Postprocedure Evaluation (Signed)
Anesthesia Post Note  Patient: Meghan Bowman  Procedure(s) Performed: OPEN REDUCTION INTERNAL FIXATION (ORIF) ANKLE FRACTURE (Left Ankle)     Patient location during evaluation: PACU Anesthesia Type: General Level of consciousness: awake and alert and oriented Pain management: pain level controlled Vital Signs Assessment: post-procedure vital signs reviewed and stable Respiratory status: spontaneous breathing, nonlabored ventilation and respiratory function stable Cardiovascular status: blood pressure returned to baseline Postop Assessment: no apparent nausea or vomiting Anesthetic complications: no    Last Vitals:  Vitals:   04/10/19 1036 04/10/19 1051  BP: 92/61 96/69  Pulse: 74 77  Resp: 17 14  Temp:  (!) 36.4 C  SpO2: 93% 94%    Last Pain:  Vitals:   04/10/19 1051  PainSc: 7                  Kaylyn Layer
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
## 2019-04-10 NOTE — Op Note (Signed)
Meghan Bowman female 59 y.o. 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 female sustained a trimalleolar ankle fracture in October 2020.  There is minimal displacement and therefore attempt at nonoperative management was undertaken.  Due to instability she had progressive deformity and went on to nonunion of her medial malleolus and malunion of her distal fibula with lateral and anterior subluxation of her tibiotalar joint and hindfoot valgus resulting in continued pain.  Given this she was indicated for open treatment and repair of malunion and nonunion.  Patient understood the risk benefits alternatives surgery which include but not limited to wound healing complications, infection, nonunion, malunion, need for further surgery, continued pain, development of ankle arthritis.  She understood the importance of maintaining her nonweightbearing to reduce the risk of development of nonunion and malunion.  After weighing these risks he opted to proceed with surgery.  Patient has a history of narcotic use and therefore will be managed with regard to her postoperative pain by her primary care physician Dr. Kristie Cowman.  PROCEDURE:Patient was identified the preoperative holding area.  The left leg was marked myself.  Consent was signed myself and the patient.  Peripheral nerve block was performed by anesthesia.  Patient was taken to the operative  suite and placed supine on the operative table.  General anesthesia was induced out difficulty.  Preoperative antibiotics were given.  Thigh tourniquet was placed on the operative thigh and a bump was placed under the hip. Bone foam was used.  All bony prominences well-padded.  Surgical timeout was performed.  The operative extremity was then elevated and the tourniquet inflated to 250 mmHg.  We began by making a longitudinal incision overlying the distal fibula at the site of the fibular malunion.  This taken sharply down through skin and subcutaneous tissue.  Skin bleeders were Bovie cauterized.  Skin flaps were created anteriorly and posteriorly.  Then the fibula was identified and no branches of the superficial peroneal nerve were within the surgical field.  A subperiosteal dissection was taken about the site of her distal fibular malunion.  This taken sharply down through the periosteum and these flaps were elevated.  The malunion site was identified.  Using a Valora Corporal the the malunion site was identified.  An osteotome was used to osteotomize the malunion site and free of the site of the prior fracture.  This was a posterior distal fragment of the fibula.  This was then mobilized and freed up from surrounding soft tissue.  There is a large amount of scarring within the lateral ankle joint.  This was excised using a 15 blade to allow for appropriate reduction of the distal fibula.  Then we turned our attention to the medial malleolus.  A longitudinal incision was made overlying the medial malleolus at the site of the nonunion.  This taken sharply down through skin and subcutaneous tissue.  Bovie cautery was used to cauterize skin bleeders.  None anterior and posterior flaps were created of the skin and subcutaneous tissue.  Then sharp dissection down to bone was performed.  Subperiosteal flaps at the site of the nonunion were created.  Using a Therapist, nutritional the nonunion site was identified.  Fluoroscopic  imaging was used to confirm the appropriate position of the malunion and nonunion.  Then using an osteotome and a Glorious Peach the nonunion site was opened up.  There is some fibrinous tissue within this area that was cleared out with a rondure.  Then the ankle joint was inspected.  There is small amount of cartilage wear on the medial malleolus within the ankle joint as well as with the medial talar dome.  There is some anterior subluxation of the tibiotalar joint.  Then we turned our attention back to the lateral malleolus.  The distal fracture fragment was reduced under direct visualization and held with a lobster claw and K wire fixation.  Then a lag screw was placed across this.  This was done by technique.  Then an distal fibular locking plate was used with a combination of nonlocking and locking screws to fix the fracture site.  The fracture site was found to be acceptably reduced on fluoroscopy.  We turned our attention back to the medial malleolus.  This was again inspected for any evidence of fibrous tissue within the nonunion site.  Then the fracture site was reduced under direct visualization helped visually with K wire fixation.  Then 2 partially-threaded 4 oh cannulated screws were placed across this with good compression and fixation.  The tibiotalar joint was acceptably reduced on fluoroscopy.  Then the posterior tibial tendon sheath was identified.  A small incision was made within the posterior tibial tendon sheath and the tendon was inspected at the level of the medial malleolus and distal and there is found to be no tearing within the tendon.  This tissue was then repaired with an absorbable suture.  Once hardware was then placed the ankle was stressed.  There was a reduction in the amount of hindfoot valgus after fixation and reduction of the fibula.  Then the soft tissue overlying the fracture site of the fibula was closed using a 2-0 Monocryl stitch.  The subcuticular tissue was closed on both sides  with a 3-0 Monocryl.  Skin was closed with staples.  A soft dressing and short leg splint was placed. She was awakened from anesthesia and taken recovery in stable condition.  There were no complications.  All counts were correct at the end of the case.  POST OPERATIVE INSTRUCTIONS: Nonweightbearing to left lower extremity Keep splint dry and intact She will obtain pain medication from her primary care physician Dr. Adolphus Birchwood.  We have called his office to inform him that surgery has been completed. She will call the office with concerns Follow-up in 2 weeks for splint removal, suture removal and x-rays nonweightbearing on the left ankle.  TOURNIQUET TIME: 114 minutes  BLOOD LOSS:  less than 50 mL         DRAINS: none         SPECIMEN: none       COMPLICATIONS:  * No complications entered in OR log *         Disposition: PACU - hemodynamically stable.         Condition: stable

## 2019-04-10 NOTE — Anesthesia Procedure Notes (Signed)
Anesthesia Regional Block: Adductor canal block   Pre-Anesthetic Checklist: ,, timeout performed, Correct Patient, Correct Site, Correct Laterality, Correct Procedure, Correct Position, site marked, Risks and benefits discussed, pre-op evaluation,  At surgeon's request and post-op pain management  Laterality: Left  Prep: Maximum Sterile Barrier Precautions used, chloraprep       Needles:  Injection technique: Single-shot  Needle Type: Echogenic Stimulator Needle     Needle Length: 9cm  Needle Gauge: 22     Additional Needles:   Procedures:,,,, ultrasound used (permanent image in chart),,,,  Narrative:  Start time: 04/10/2019 7:05 AM End time: 04/10/2019 7:08 AM Injection made incrementally with aspirations every 5 mL.  Performed by: Personally  Anesthesiologist: Kaylyn Layer, MD  Additional Notes: Risks, benefits, and alternative discussed. Patient gave consent for procedure. Patient prepped and draped in sterile fashion. Sedation administered, patient remains easily responsive to voice. Relevant anatomy identified with ultrasound guidance. Local anesthetic given in 5cc increments with no signs or symptoms of intravascular injection. No pain or paraesthesias with injection. Patient monitored throughout procedure with signs of LAST or immediate complications. Tolerated well. Ultrasound image placed in chart.  Amalia Greenhouse, MD

## 2019-04-10 NOTE — Discharge Instructions (Signed)
DR. Duron Meister FOOT [facility] ANKLE SURGERY POST-OP INSTRUCTIONS   Pain Management 1. The numbing medicine and your leg will last around 18 hours, take a dose of your pain medicine as soon as you feel it wearing off to avoid rebound pain. 2. Keep your foot elevated above heart level.  Make sure that your heel hangs free ('floats'). 3. Take all prescribed medication as directed. 4. If taking narcotic pain medication you may want to use an over-the-counter stool softener to avoid constipation. 5. You may take over-the-counter NSAIDs (ibuprofen, naproxen, etc.) as well as over-the-counter acetaminophen as directed on the packaging as a supplement for your pain and may also use it to wean away from the prescription medication.  Activity ? Non-weightbearing ? Keep splint intact  First Postoperative Visit 1. Your first postop visit will be at least 2 weeks after surgery.  This should be scheduled when you schedule surgery. 2. If you do not have a postoperative visit scheduled please call [phone] to schedule an appointment. 3. At the appointment your incision will be evaluated for suture removal, x-rays will be obtained if necessary.  General Instructions 1. Swelling is very common after foot and ankle surgery.  It often takes 3 months for the foot and ankle to begin to feel comfortable.  Some amount of swelling will persist for 6-12 months. 2. DO NOT change the dressing.  If there is a problem with the dressing (too tight, loose, gets wet, etc.) please contact Dr. Dalayah Deahl's office. 3. DO NOT get the dressing wet.  For showers you can use an over-the-counter cast cover or wrap a washcloth around the top of your dressing and then cover it with a plastic bag and tape it to your leg. 4. DO NOT soak the incision (no tubs, pools, bath, etc.) until you have approval from Dr. Piera Downs.  Contact Dr. Adairs office or go to Emergency Room if: 1. Temperature above 101 F. 2. Increasing pain that is unresponsive to pain  medication or elevation 3. Excessive redness or swelling in your foot 4. Dressing problems - excessive bloody drainage, looseness or tightness, or if dressing gets wet 5. Develop pain, swelling, warmth, or discoloration of your calf  

## 2019-04-10 NOTE — Transfer of Care (Signed)
Immediate Anesthesia Transfer of Care Note  Patient: Meghan Bowman  Procedure(s) Performed: OPEN REDUCTION INTERNAL FIXATION (ORIF) ANKLE FRACTURE (Left Ankle)  Patient Location: PACU  Anesthesia Type:General and Regional  Level of Consciousness: awake, oriented and patient cooperative  Airway & Oxygen Therapy: Patient Spontanous Breathing and Patient connected to face mask oxygen  Post-op Assessment: Report given to RN and Post -op Vital signs reviewed and stable  Post vital signs: Reviewed  Last Vitals:  Vitals Value Taken Time  BP 99/51 04/10/19 1021  Temp    Pulse 73 04/10/19 1026  Resp 12 04/10/19 1026  SpO2 92 % 04/10/19 1026  Vitals shown include unvalidated device data.  Last Pain:  Vitals:   04/10/19 0604  PainSc: 7       Patients Stated Pain Goal: 4 (04/10/19 0604)  Complications: No apparent anesthesia complications

## 2019-04-10 NOTE — Anesthesia Procedure Notes (Signed)
Anesthesia Regional Block: Popliteal block   Pre-Anesthetic Checklist: ,, timeout performed, Correct Patient, Correct Site, Correct Laterality, Correct Procedure, Correct Position, site marked, Risks and benefits discussed, pre-op evaluation,  At surgeon's request and post-op pain management  Laterality: Left  Prep: Maximum Sterile Barrier Precautions used, chloraprep       Needles:  Injection technique: Single-shot  Needle Type: Echogenic Stimulator Needle     Needle Length: 9cm  Needle Gauge: 22     Additional Needles:   Procedures:,,,, ultrasound used (permanent image in chart),,,,  Narrative:  Start time: 04/10/2019 7:11 AM End time: 04/10/2019 7:14 AM Injection made incrementally with aspirations every 5 mL.  Performed by: Personally  Anesthesiologist: Kaylyn Layer, MD  Additional Notes: Risks, benefits, and alternative discussed. Patient gave consent for procedure. Patient prepped and draped in sterile fashion. Sedation administered, patient remains easily responsive to voice. Relevant anatomy identified with ultrasound guidance. Local anesthetic given in 5cc increments with no signs or symptoms of intravascular injection. No pain or paraesthesias with injection. Patient monitored throughout procedure with signs of LAST or immediate complications. Tolerated well. Ultrasound image placed in chart.  Meghan Greenhouse, MD

## 2019-04-11 ENCOUNTER — Encounter: Payer: Self-pay | Admitting: *Deleted

## 2019-04-19 ENCOUNTER — Other Ambulatory Visit: Payer: Self-pay | Admitting: *Deleted

## 2019-04-19 NOTE — Telephone Encounter (Signed)
error 

## 2019-06-11 ENCOUNTER — Encounter: Payer: Self-pay | Admitting: Rehabilitative and Restorative Service Providers"

## 2019-06-11 ENCOUNTER — Ambulatory Visit: Payer: Medicaid Other | Attending: Orthopaedic Surgery | Admitting: Rehabilitative and Restorative Service Providers"

## 2019-06-11 ENCOUNTER — Other Ambulatory Visit: Payer: Self-pay

## 2019-06-11 DIAGNOSIS — M25672 Stiffness of left ankle, not elsewhere classified: Secondary | ICD-10-CM | POA: Insufficient documentation

## 2019-06-11 DIAGNOSIS — R2689 Other abnormalities of gait and mobility: Secondary | ICD-10-CM | POA: Diagnosis not present

## 2019-06-11 DIAGNOSIS — M25572 Pain in left ankle and joints of left foot: Secondary | ICD-10-CM | POA: Diagnosis present
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
## 2019-06-11 NOTE — Patient Instructions (Signed)
Access Code: [email or web address] URL: [email or web address] Date: 06/11/2019 Prepared by: Pauletta Browns  Exercises Long Sitting Calf Stretch with Strap - 3 x daily - 7 x weekly - 1 sets - 5 reps - 20 hold Isometric Ankle Eversion at Wall - 1 x daily - 7 x weekly - 1 sets - 50 reps - 5 hold Isometric Ankle Inversion at Wall - 1 x daily - 7 x weekly - 1 sets - 50 reps - 5 hold Seated Heel Raise - 5 x daily - 7 x weekly - 1 sets - 10 reps - 3 hold

## 2019-06-27 ENCOUNTER — Encounter: Payer: Self-pay | Admitting: Rehabilitative and Restorative Service Providers"

## 2019-06-27 ENCOUNTER — Ambulatory Visit: Payer: Medicaid Other | Attending: Orthopaedic Surgery | Admitting: Rehabilitative and Restorative Service Providers"

## 2019-06-27 ENCOUNTER — Other Ambulatory Visit: Payer: Self-pay

## 2019-06-27 DIAGNOSIS — M25672 Stiffness of left ankle, not elsewhere classified: Secondary | ICD-10-CM | POA: Insufficient documentation

## 2019-06-27 DIAGNOSIS — R2689 Other abnormalities of gait and mobility: Secondary | ICD-10-CM | POA: Diagnosis present

## 2019-06-27 DIAGNOSIS — M25572 Pain in left ankle and joints of left foot: Secondary | ICD-10-CM | POA: Insufficient documentation

## 2019-06-27 NOTE — Patient Instructions (Signed)
Access Code: [email or web address] URL: [email or web address] Date: 06/27/2019 Prepared by: Pauletta Browns  Exercises Supine Quadricep Sets - 2-5 x daily - 7 x weekly - 5-10 sets - 5 reps - 5 hold Sidelying Hip Abduction - 1 x daily - 7 x weekly - 3 sets - 10 reps - 3 seconds hold Standing Gastroc Stretch - 2-3 x daily - 7 x weekly - 1 sets - 5 reps - 20 hold
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
## 2019-07-02 ENCOUNTER — Ambulatory Visit: Payer: Medicaid Other | Admitting: Physical Therapy

## 2019-12-24 ENCOUNTER — Emergency Department (HOSPITAL_COMMUNITY): Payer: Medicaid Other

## 2019-12-24 ENCOUNTER — Emergency Department (HOSPITAL_COMMUNITY)
Admission: EM | Admit: 2019-12-24 | Discharge: 2019-12-24 | Disposition: A | Discharge status: Home / Self Care | Payer: Medicaid Other | Attending: Emergency Medicine | Admitting: Emergency Medicine

## 2019-12-24 ENCOUNTER — Other Ambulatory Visit: Payer: Self-pay

## 2019-12-24 ENCOUNTER — Encounter (HOSPITAL_COMMUNITY): Payer: Self-pay

## 2019-12-24 DIAGNOSIS — Y9301 Activity, walking, marching and hiking: Secondary | ICD-10-CM | POA: Insufficient documentation

## 2019-12-24 DIAGNOSIS — X501XXA Overexertion from prolonged static or awkward postures, initial encounter: Secondary | ICD-10-CM | POA: Diagnosis not present

## 2019-12-24 DIAGNOSIS — J449 Chronic obstructive pulmonary disease, unspecified: Secondary | ICD-10-CM | POA: Insufficient documentation

## 2019-12-24 DIAGNOSIS — S8002XA Contusion of left knee, initial encounter: Secondary | ICD-10-CM | POA: Insufficient documentation

## 2019-12-24 DIAGNOSIS — H9312 Tinnitus, left ear: Secondary | ICD-10-CM | POA: Insufficient documentation

## 2019-12-24 DIAGNOSIS — Z96652 Presence of left artificial knee joint: Secondary | ICD-10-CM | POA: Diagnosis not present

## 2019-12-24 DIAGNOSIS — S99912A Unspecified injury of left ankle, initial encounter: Secondary | ICD-10-CM | POA: Diagnosis present

## 2019-12-24 DIAGNOSIS — I10 Essential (primary) hypertension: Secondary | ICD-10-CM | POA: Insufficient documentation

## 2019-12-24 DIAGNOSIS — S93402A Sprain of unspecified ligament of left ankle, initial encounter: Secondary | ICD-10-CM | POA: Diagnosis not present

## 2019-12-24 DIAGNOSIS — Z7982 Long term (current) use of aspirin: Secondary | ICD-10-CM | POA: Diagnosis not present

## 2019-12-24 DIAGNOSIS — Z7951 Long term (current) use of inhaled steroids: Secondary | ICD-10-CM | POA: Insufficient documentation

## 2019-12-24 DIAGNOSIS — I2510 Atherosclerotic heart disease of native coronary artery without angina pectoris: Secondary | ICD-10-CM | POA: Diagnosis not present

## 2019-12-24 DIAGNOSIS — Z79899 Other long term (current) drug therapy: Secondary | ICD-10-CM | POA: Insufficient documentation

## 2019-12-24 DIAGNOSIS — F17210 Nicotine dependence, cigarettes, uncomplicated: Secondary | ICD-10-CM | POA: Insufficient documentation

## 2019-12-24 DIAGNOSIS — W19XXXA Unspecified fall, initial encounter: Secondary | ICD-10-CM

## 2019-12-24 NOTE — Discharge Instructions (Signed)
1.  You appear to have an ankle sprain on the left.  Elevate and ice the ankle as well as your knee if it is sore.  May use the walking boot for weightbearing on the left.  You may use an Ace wrap on your left knee for any swelling. 2.  Schedule follow-up appointment with your orthopedic specialist. 3.  You reported some sinus pressure and ringing in the ear.  You may try over-the-counter Allegra or Claritin for sinus congestion.  Your symptoms may also be due to your chronic left-sided headache pattern.  Discussed this with your neurologist if your symptoms are persisting or worsening.
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
## 2019-12-24 NOTE — ED Notes (Signed)
Ortho tech at bedside 

## 2019-12-24 NOTE — Progress Notes (Signed)
Orthopedic Tech Progress Note Patient Details:  Meghan Bowman 06/08/1960 [id number]  Ortho Devices Type of Ortho Device: Ace wrap Ortho Device/Splint Location: applied ace wrap to knee and cam walker to left foot Ortho Device/Splint Interventions: Ordered, Application   Post Interventions Patient Tolerated: Well Instructions Provided: Care of device   [name] 12/24/2019, 3:34 PM  
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
## 2019-12-24 NOTE — Progress Notes (Signed)
Orthopedic Tech Progress Note Patient Details:  Meghan Bowman 1960/11/30 [id number]  Ortho Devices Type of Ortho Device: Ace wrap Ortho Device/Splint Location: applied ace wrap to knee and cam walker to left foot Ortho Device/Splint Interventions: Ordered, Application   Post Interventions Patient Tolerated: Well Instructions Provided: Care of device   Jennye Moccasin 12/24/2019, 3:34 PM

## 2020-02-15 ENCOUNTER — Other Ambulatory Visit: Payer: Self-pay | Admitting: *Deleted

## 2020-02-15 NOTE — Telephone Encounter (Signed)
Error

## 2020-08-12 IMAGING — MR MRI HEAD WITHOUT CONTRAST
13 of 14 series · 37 of 48 positions shown · non-contrast
Comparison: Head CT from yesterday

CLINICAL DATA: TIA, initial exam

EXAM:
MRI HEAD WITHOUT CONTRAST
MRA HEAD WITHOUT CONTRAST
TECHNIQUE: Multiplanar, multiecho pulse sequences of the brain and surrounding
structures were obtained without intravenous contrast. Angiographic
images of the head were obtained using MRA technique without
contrast.

[Series 5: DWI · axial · 3.0mm · 0.88mm/px · z∈[-47,+96]mm · 5 of 98 slices shown (1 of 4)]
[im 1/98]
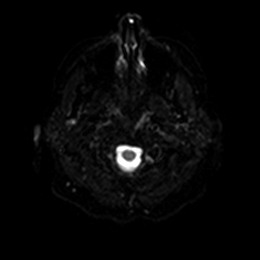
[im 25/98]
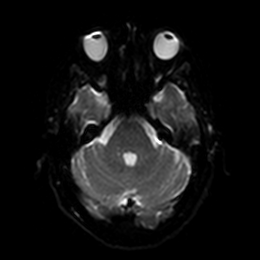
[im 49/98]
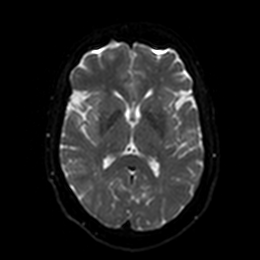
[im 73/98]
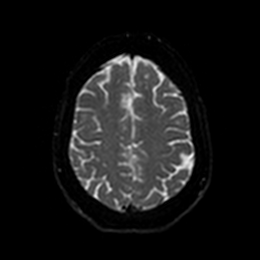
[im 98/98]
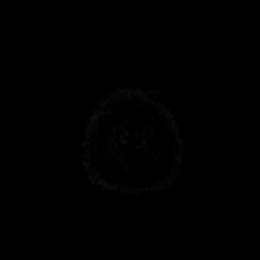

[Series 6: DWI · axial · 3.0mm · 0.88mm/px · z∈[-47,+96]mm · 2 of 49 slices shown (2 of 4)]
[im 1/49]
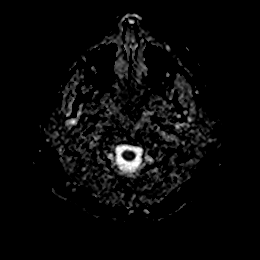
[im 49/49]
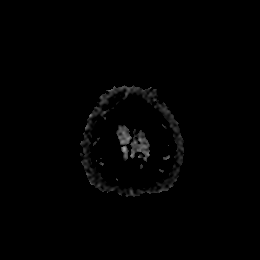

[Series 11: DWI · coronal · 4.0mm · 0.88mm/px · 4 of 70 slices shown (3 of 4)]
[im 1/70]
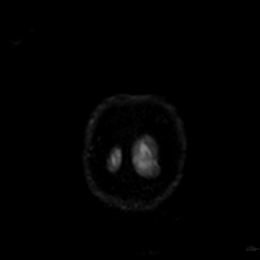
[im 24/70]
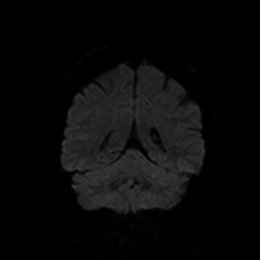
[im 47/70]
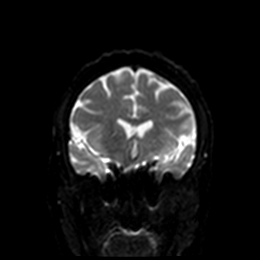
[im 70/70]
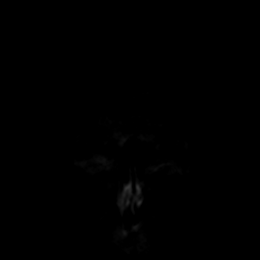

[Series 12: DWI · coronal · 4.0mm · 0.88mm/px · 2 of 35 slices shown (4 of 4)]
[im 1/35]
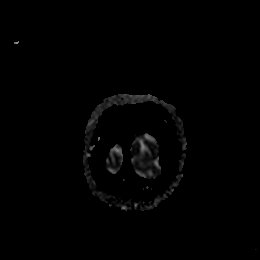
[im 35/35]
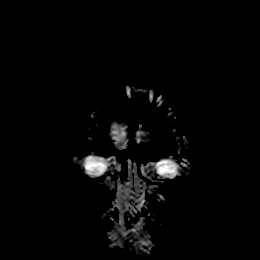

[Series 13: T1 · sagittal · 5.0mm · 0.75mm/px · 1 of 23 slices shown]
[im 1/23]
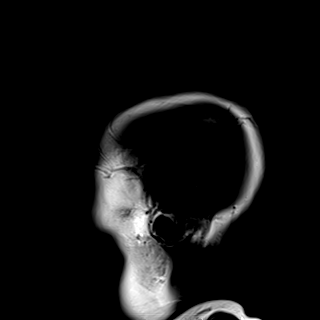

[Series 14: T2 · axial · 5.0mm · 0.72mm/px · z∈[-48,+95]mm · 2 of 25 slices shown (1 of 2)]
[im 1/25]
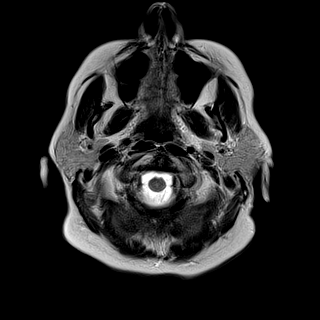
[im 25/25]
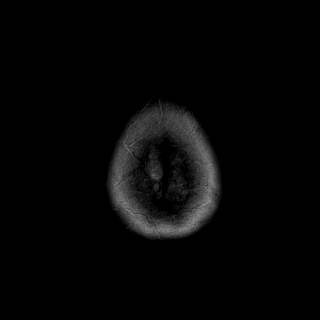

[Series 15: FLAIR · axial · 5.0mm · 0.45mm/px · z∈[-50,+92]mm · 2 of 25 slices shown (1 of 2)]
[im 1/25]
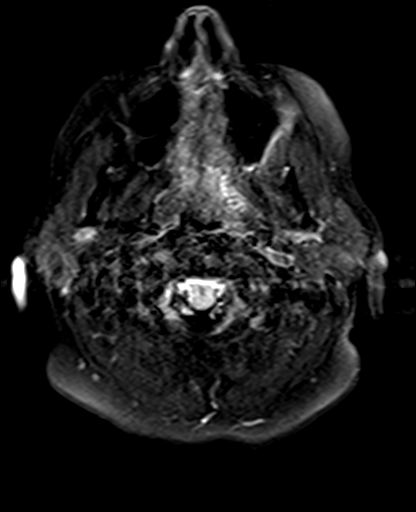
[im 25/25]
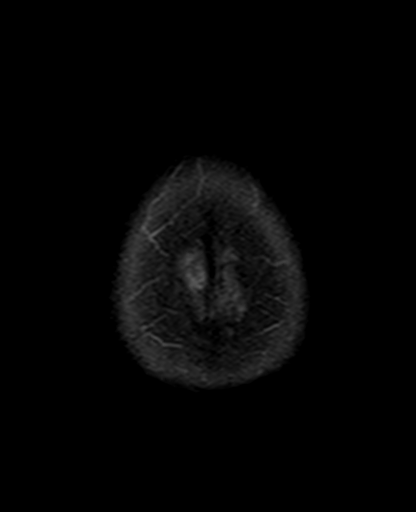

[Series 16: mag_images · axial · 3.0mm · 0.90mm/px · z∈[-60,+115]mm · 4 of 60 slices shown]
[im 1/60]
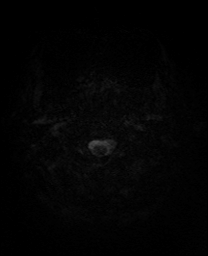
[im 20/60]
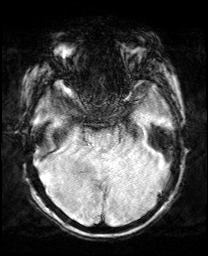
[im 40/60]
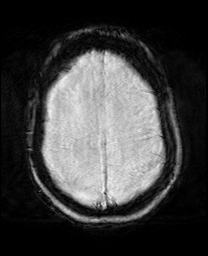
[im 60/60]
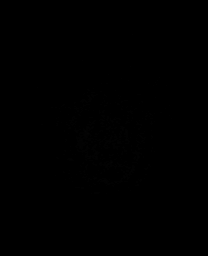

[Series 17: pha_images · axial · 3.0mm · 0.90mm/px · z∈[-60,+115]mm · 4 of 60 slices shown]
[im 1/60]
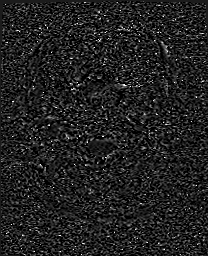
[im 20/60]
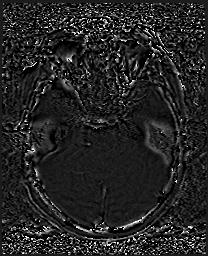
[im 40/60]
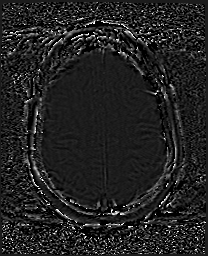
[im 60/60]
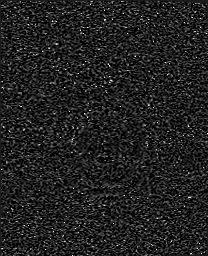

[Series 18: swi_images · axial · 3.0mm · 0.90mm/px · z∈[-60,+115]mm · 4 of 60 slices shown]
[im 1/60]
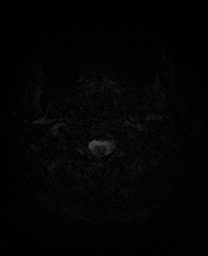
[im 20/60]
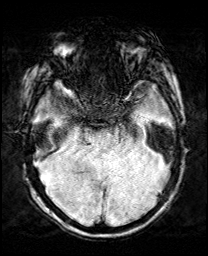
[im 40/60]
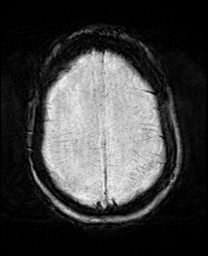
[im 60/60]
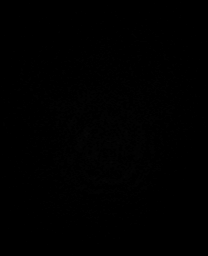

[Series 19: mip_images(sw) · axial · 24.0mm · 0.90mm/px · z∈[-50,+105]mm · 3 of 53 slices shown]
[im 1/53]
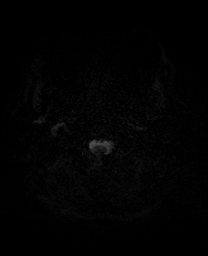
[im 27/53]
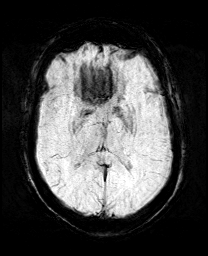
[im 53/53]
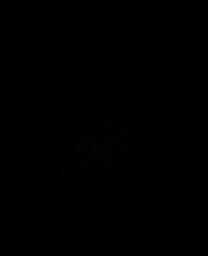

[Series 20: FLAIR · axial · 5.0mm · 0.90mm/px · z∈[-49,+94]mm · 2 of 25 slices shown (2 of 2)]
[im 1/25]
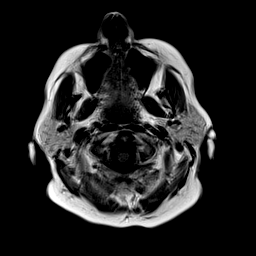
[im 25/25]
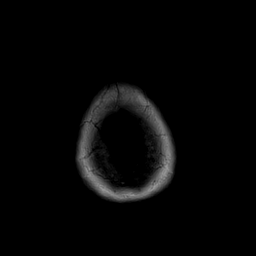

[Series 21: T2 · coronal · 5.0mm · 0.72mm/px · 2 of 28 slices shown (2 of 2)]
[im 1/28]
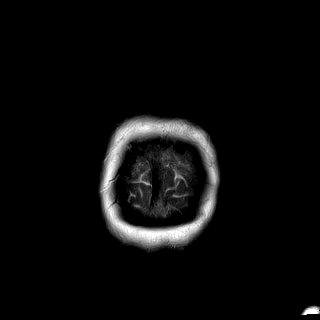
[im 28/28]
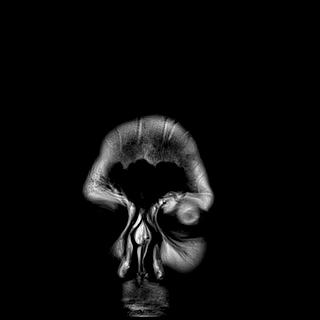

[37 of 48 positions shown; findings below may reference images not displayed]

FINDINGS: MRI HEAD FINDINGS

Brain: No acute infarction, hemorrhage, hydrocephalus, extra-axial
collection or mass lesion. There are a few dilated perivascular
spaces. Acute or infarct was suggested on prior head CT but are not
seen at the level of the basal ganglia. Three or 4 FLAIR
hyperintensities in the cerebral white matter from nonspecific
remote insult, commonly seen to this degree at the patient's age.

Vascular: Major flow voids are preserved

Skull and upper cervical spine: Negative for marrow lesion

Sinuses/Orbits: Negative

Other: Intermittently motion degraded, especially sagittal T1
weighted imaging.

MRA HEAD FINDINGS

Mild motion artifact especially at the level of the cavernous
sinuses. No branch occlusion, beading, flow limiting stenosis, or
aneurysm. Unremarkable intracranial anatomy.
IMPRESSION: Brain MRI:

1. No emergent finding or explanation for symptoms.
2. Motion degraded.

Intracranial MRA:

Negative.

## 2020-10-24 ENCOUNTER — Emergency Department (HOSPITAL_COMMUNITY): Payer: Medicaid Other

## 2020-10-24 ENCOUNTER — Encounter (HOSPITAL_COMMUNITY): Payer: Self-pay

## 2020-10-24 ENCOUNTER — Emergency Department (HOSPITAL_COMMUNITY)
Admission: EM | Admit: 2020-10-24 | Discharge: 2020-10-25 | Disposition: A | Discharge status: Home / Self Care | Payer: Medicaid Other | Attending: Emergency Medicine | Admitting: Emergency Medicine

## 2020-10-24 ENCOUNTER — Other Ambulatory Visit: Payer: Self-pay

## 2020-10-24 DIAGNOSIS — I10 Essential (primary) hypertension: Secondary | ICD-10-CM | POA: Diagnosis not present

## 2020-10-24 DIAGNOSIS — E876 Hypokalemia: Secondary | ICD-10-CM | POA: Diagnosis not present

## 2020-10-24 DIAGNOSIS — F17210 Nicotine dependence, cigarettes, uncomplicated: Secondary | ICD-10-CM | POA: Insufficient documentation

## 2020-10-24 DIAGNOSIS — R519 Headache, unspecified: Secondary | ICD-10-CM | POA: Insufficient documentation

## 2020-10-24 DIAGNOSIS — Z96652 Presence of left artificial knee joint: Secondary | ICD-10-CM | POA: Insufficient documentation

## 2020-10-24 DIAGNOSIS — J449 Chronic obstructive pulmonary disease, unspecified: Secondary | ICD-10-CM | POA: Diagnosis not present

## 2020-10-24 DIAGNOSIS — I2510 Atherosclerotic heart disease of native coronary artery without angina pectoris: Secondary | ICD-10-CM | POA: Diagnosis not present

## 2020-10-24 DIAGNOSIS — H9319 Tinnitus, unspecified ear: Secondary | ICD-10-CM | POA: Insufficient documentation

## 2020-10-24 DIAGNOSIS — R42 Dizziness and giddiness: Secondary | ICD-10-CM | POA: Insufficient documentation

## 2020-10-24 DIAGNOSIS — Z8616 Personal history of COVID-19: Secondary | ICD-10-CM | POA: Diagnosis not present

## 2020-10-24 DIAGNOSIS — Z79899 Other long term (current) drug therapy: Secondary | ICD-10-CM | POA: Diagnosis not present

## 2020-10-24 DIAGNOSIS — J45909 Unspecified asthma, uncomplicated: Secondary | ICD-10-CM | POA: Diagnosis not present

## 2020-10-24 LAB — CBC
HCT: 43.4 % (ref 36.0–46.0)
Hemoglobin: 14.1 g/dL (ref 12.0–15.0)
MCH: 29.3 pg (ref 26.0–34.0)
MCHC: 32.5 g/dL (ref 30.0–36.0)
MCV: 90 fL (ref 80.0–100.0)
Platelets: 225 10*3/uL (ref 150–400)
RBC: 4.82 MIL/uL (ref 3.87–5.11)
RDW: 12.1 % (ref 11.5–15.5)
WBC: 8.8 10*3/uL (ref 4.0–10.5)
nRBC: 0 % (ref 0.0–0.2)

## 2020-10-24 LAB — I-STAT BETA HCG BLOOD, ED (MC, WL, AP ONLY): I-stat hCG, quantitative: <5 m[IU]/mL (ref <5)

## 2020-10-24 LAB — BASIC METABOLIC PANEL
Anion gap: 10 (ref 5–15)
BUN: 9 mg/dL (ref 6–20)
CO2: 27 mmol/L (ref 22–32)
Calcium: 10 mg/dL (ref 8.9–10.3)
Chloride: 109 mmol/L (ref 98–111)
Creatinine, Ser: 0.68 mg/dL (ref 0.44–1.00)
GFR, Estimated: >60 mL/min (ref >60)
Glucose, Bld: 105 mg/dL — ABNORMAL HIGH (ref 70–99)
Potassium: 3.1 mmol/L — ABNORMAL LOW (ref 3.5–5.1)
Sodium: 146 mmol/L — ABNORMAL HIGH (ref 135–145)

## 2020-10-24 LAB — TROPONIN I (HIGH SENSITIVITY): Troponin I (High Sensitivity): 3 ng/L (ref <18)

## 2020-10-24 MED ORDER — PROCHLORPERAZINE EDISYLATE 10 MG/2ML IJ SOLN
5.0000 mg | Freq: Once | INTRAMUSCULAR | Status: AC
  Administered 2020-10-24: 5 mg via INTRAVENOUS
  Filled 2020-10-24: qty 2

## 2020-10-24 MED ORDER — LACTATED RINGERS IV BOLUS
1000.0000 mL | Freq: Once | INTRAVENOUS | Status: AC
  Administered 2020-10-24: 1000 mL via INTRAVENOUS

## 2020-10-24 MED ORDER — LORAZEPAM 2 MG/ML IJ SOLN
1.0000 mg | Freq: Once | INTRAMUSCULAR | Status: AC
  Administered 2020-10-24: 1 mg via INTRAVENOUS
  Filled 2020-10-24: qty 1

## 2020-10-24 NOTE — ED Triage Notes (Signed)
Pt arrived via POV, c/o dizziness, chest tightness, SOB, ear full ness for several days.

## 2020-10-24 NOTE — ED Provider Notes (Signed)
Emergency Medicine Provider Triage Evaluation Note  Meghan Bowman , a 60 y.o. female  was evaluated in triage.  Pt complains of dizziness, ear fullness, and chest pain.  Symptoms have been intermittent over the last week.  Dizziness has been getting progressively worse.  Chest pain located to left chest.  Described as a tingling sensation.  No associated nausea, vomiting, shortness of breath.  Review of Systems  Positive: Dizziness, ear fullness, chest pain Negative: Numbness, weakness, facial asymmetry, aphasia, dysarthria, nausea, vomiting, shortness of breath  Physical Exam  BP 115/88 (BP Location: Left Arm)   Pulse (!) 110   Temp 97.7 F (36.5 C) (Oral)   Resp 18   SpO2 100%  Gen:   Awake, no distress   Resp:  Normal effort  MSK:   Moves extremities without difficulty  Other:    Medical Decision Making  Medically screening exam initiated at 3:09 PM.  Appropriate orders placed.  Meghan Bowman was informed that the remainder of the evaluation will be completed by another provider, this initial triage assessment does not replace that evaluation, and the importance of remaining in the ED until their evaluation is complete.  The patient appears stable so that the remainder of the work up may be completed by another provider.      Meghan Schroeder, PA-C 10/24/20 1510    Meghan Savoy, MD 10/25/20 [phone]
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
## 2020-10-24 NOTE — ED Notes (Signed)
Off the Floor MRI 

## 2020-10-24 NOTE — ED Notes (Signed)
Took over patient care, patient alert and oriented but is very dizzy with ambulation

## 2020-10-24 NOTE — ED Notes (Signed)
Pt is in MRI  

## 2020-10-25 MED ORDER — POTASSIUM CHLORIDE CRYS ER 20 MEQ PO TBCR: 20.0000 meq | EXTENDED_RELEASE_TABLET | Freq: Two times a day (BID) | ORAL | 0 refills | Status: DC

## 2020-10-25 MED ORDER — MECLIZINE HCL 25 MG PO TABS: 25.0000 mg | ORAL_TABLET | Freq: Three times a day (TID) | ORAL | 0 refills | Status: DC | PRN

## 2020-10-25 NOTE — Discharge Instructions (Addendum)
You were seen today for vertigo symptoms.  Your work-up was reassuring including MRI of your brain.  This is likely benign positional vertigo.  Follow-up with your primary physician.  Take medications as prescribed.  You also had some low potassium.  Supplement your potassium and follow-up for recheck within 1 week.

## 2021-01-02 ENCOUNTER — Other Ambulatory Visit: Payer: Self-pay | Admitting: Family Medicine

## 2021-01-02 DIAGNOSIS — Z1231 Encounter for screening mammogram for malignant neoplasm of breast: Secondary | ICD-10-CM

## 2021-02-03 ENCOUNTER — Ambulatory Visit: Payer: Medicaid Other

## 2021-05-25 ENCOUNTER — Ambulatory Visit
Admission: RE | Admit: 2021-05-25 | Discharge: 2021-05-25 | Disposition: A | Discharge status: Home / Self Care | Payer: Medicaid Other | Source: Ambulatory Visit | Attending: Family Medicine | Admitting: Family Medicine

## 2021-05-25 DIAGNOSIS — Z1231 Encounter for screening mammogram for malignant neoplasm of breast: Secondary | ICD-10-CM

## 2021-12-15 ENCOUNTER — Inpatient Hospital Stay (HOSPITAL_COMMUNITY)
Admission: EM | Admit: 2021-12-15 | Discharge: 2021-12-24 | DRG: 481 | Disposition: A | Discharge status: Rehabilitation Facility | Payer: Medicaid Other | Attending: Internal Medicine | Admitting: Internal Medicine

## 2021-12-15 ENCOUNTER — Other Ambulatory Visit: Payer: Self-pay

## 2021-12-15 ENCOUNTER — Emergency Department (HOSPITAL_COMMUNITY): Payer: Medicaid Other

## 2021-12-15 DIAGNOSIS — E785 Hyperlipidemia, unspecified: Secondary | ICD-10-CM | POA: Diagnosis present

## 2021-12-15 DIAGNOSIS — M9712XA Periprosthetic fracture around internal prosthetic left knee joint, initial encounter: Secondary | ICD-10-CM | POA: Diagnosis present

## 2021-12-15 DIAGNOSIS — F4310 Post-traumatic stress disorder, unspecified: Secondary | ICD-10-CM | POA: Diagnosis present

## 2021-12-15 DIAGNOSIS — Z8679 Personal history of other diseases of the circulatory system: Secondary | ICD-10-CM | POA: Diagnosis not present

## 2021-12-15 DIAGNOSIS — K5901 Slow transit constipation: Secondary | ICD-10-CM | POA: Diagnosis not present

## 2021-12-15 DIAGNOSIS — F17210 Nicotine dependence, cigarettes, uncomplicated: Secondary | ICD-10-CM | POA: Diagnosis not present

## 2021-12-15 DIAGNOSIS — Z9109 Other allergy status, other than to drugs and biological substances: Secondary | ICD-10-CM

## 2021-12-15 DIAGNOSIS — S72402A Unspecified fracture of lower end of left femur, initial encounter for closed fracture: Secondary | ICD-10-CM | POA: Diagnosis not present

## 2021-12-15 DIAGNOSIS — I10 Essential (primary) hypertension: Secondary | ICD-10-CM | POA: Diagnosis not present

## 2021-12-15 DIAGNOSIS — I9589 Other hypotension: Secondary | ICD-10-CM

## 2021-12-15 DIAGNOSIS — G43909 Migraine, unspecified, not intractable, without status migrainosus: Secondary | ICD-10-CM | POA: Diagnosis present

## 2021-12-15 DIAGNOSIS — M978XXA Periprosthetic fracture around other internal prosthetic joint, initial encounter: Secondary | ICD-10-CM | POA: Diagnosis not present

## 2021-12-15 DIAGNOSIS — S728X2A Other fracture of left femur, initial encounter for closed fracture: Secondary | ICD-10-CM | POA: Diagnosis not present

## 2021-12-15 DIAGNOSIS — Z6835 Body mass index (BMI) 35.0-35.9, adult: Secondary | ICD-10-CM | POA: Diagnosis not present

## 2021-12-15 DIAGNOSIS — I2510 Atherosclerotic heart disease of native coronary artery without angina pectoris: Secondary | ICD-10-CM | POA: Diagnosis not present

## 2021-12-15 DIAGNOSIS — Z8249 Family history of ischemic heart disease and other diseases of the circulatory system: Secondary | ICD-10-CM | POA: Diagnosis not present

## 2021-12-15 DIAGNOSIS — G8929 Other chronic pain: Secondary | ICD-10-CM | POA: Diagnosis present

## 2021-12-15 DIAGNOSIS — W19XXXA Unspecified fall, initial encounter: Secondary | ICD-10-CM

## 2021-12-15 DIAGNOSIS — G4733 Obstructive sleep apnea (adult) (pediatric): Secondary | ICD-10-CM | POA: Diagnosis present

## 2021-12-15 DIAGNOSIS — S728X9A Other fracture of unspecified femur, initial encounter for closed fracture: Principal | ICD-10-CM

## 2021-12-15 DIAGNOSIS — Y9301 Activity, walking, marching and hiking: Secondary | ICD-10-CM | POA: Diagnosis present

## 2021-12-15 DIAGNOSIS — Y92009 Unspecified place in unspecified non-institutional (private) residence as the place of occurrence of the external cause: Secondary | ICD-10-CM

## 2021-12-15 DIAGNOSIS — R5383 Other fatigue: Secondary | ICD-10-CM | POA: Diagnosis not present

## 2021-12-15 DIAGNOSIS — Z886 Allergy status to analgesic agent status: Secondary | ICD-10-CM

## 2021-12-15 DIAGNOSIS — I959 Hypotension, unspecified: Secondary | ICD-10-CM | POA: Diagnosis present

## 2021-12-15 DIAGNOSIS — E669 Obesity, unspecified: Secondary | ICD-10-CM | POA: Diagnosis present

## 2021-12-15 DIAGNOSIS — S72492D Other fracture of lower end of left femur, subsequent encounter for closed fracture with routine healing: Secondary | ICD-10-CM | POA: Diagnosis not present

## 2021-12-15 DIAGNOSIS — Z79899 Other long term (current) drug therapy: Secondary | ICD-10-CM

## 2021-12-15 DIAGNOSIS — W010XXA Fall on same level from slipping, tripping and stumbling without subsequent striking against object, initial encounter: Secondary | ICD-10-CM | POA: Diagnosis present

## 2021-12-15 DIAGNOSIS — K219 Gastro-esophageal reflux disease without esophagitis: Secondary | ICD-10-CM | POA: Diagnosis present

## 2021-12-15 DIAGNOSIS — M25562 Pain in left knee: Secondary | ICD-10-CM | POA: Diagnosis present

## 2021-12-15 DIAGNOSIS — D62 Acute posthemorrhagic anemia: Secondary | ICD-10-CM | POA: Diagnosis not present

## 2021-12-15 DIAGNOSIS — F411 Generalized anxiety disorder: Secondary | ICD-10-CM | POA: Diagnosis not present

## 2021-12-15 DIAGNOSIS — Z96659 Presence of unspecified artificial knee joint: Secondary | ICD-10-CM | POA: Diagnosis not present

## 2021-12-15 DIAGNOSIS — K5900 Constipation, unspecified: Secondary | ICD-10-CM | POA: Diagnosis present

## 2021-12-15 DIAGNOSIS — E782 Mixed hyperlipidemia: Secondary | ICD-10-CM

## 2021-12-15 DIAGNOSIS — Z9081 Acquired absence of spleen: Secondary | ICD-10-CM

## 2021-12-15 DIAGNOSIS — Z8781 Personal history of (healed) traumatic fracture: Secondary | ICD-10-CM | POA: Diagnosis not present

## 2021-12-15 DIAGNOSIS — R3915 Urgency of urination: Secondary | ICD-10-CM | POA: Diagnosis not present

## 2021-12-15 DIAGNOSIS — S72492A Other fracture of lower end of left femur, initial encounter for closed fracture: Principal | ICD-10-CM | POA: Diagnosis present

## 2021-12-15 DIAGNOSIS — J4520 Mild intermittent asthma, uncomplicated: Secondary | ICD-10-CM | POA: Diagnosis present

## 2021-12-15 DIAGNOSIS — D72829 Elevated white blood cell count, unspecified: Secondary | ICD-10-CM | POA: Diagnosis present

## 2021-12-15 DIAGNOSIS — F32A Depression, unspecified: Secondary | ICD-10-CM | POA: Diagnosis present

## 2021-12-15 LAB — CBC WITH DIFFERENTIAL/PLATELET
Abs Immature Granulocytes: 0.05 10*3/uL (ref 0.00–0.07)
Basophils Absolute: 0.1 10*3/uL (ref 0.0–0.1)
Basophils Relative: 0 %
Eosinophils Absolute: 0.1 10*3/uL (ref 0.0–0.5)
Eosinophils Relative: 1 %
HCT: 36.6 % (ref 36.0–46.0)
Hemoglobin: 12.1 g/dL (ref 12.0–15.0)
Immature Granulocytes: 0 %
Lymphocytes Relative: 25 %
Lymphs Abs: 3 10*3/uL (ref 0.7–4.0)
MCH: 29.7 pg (ref 26.0–34.0)
MCHC: 33.1 g/dL (ref 30.0–36.0)
MCV: 89.9 fL (ref 80.0–100.0)
Monocytes Absolute: 0.8 10*3/uL (ref 0.1–1.0)
Monocytes Relative: 7 %
Neutro Abs: 7.9 10*3/uL — ABNORMAL HIGH (ref 1.7–7.7)
Neutrophils Relative %: 67 %
Platelets: 222 10*3/uL (ref 150–400)
RBC: 4.07 MIL/uL (ref 3.87–5.11)
RDW: 12.3 % (ref 11.5–15.5)
WBC: 11.9 10*3/uL — ABNORMAL HIGH (ref 4.0–10.5)
nRBC: 0 % (ref 0.0–0.2)

## 2021-12-15 LAB — COMPREHENSIVE METABOLIC PANEL
ALT: 17 U/L (ref 0–44)
AST: 21 U/L (ref 15–41)
Albumin: 4 g/dL (ref 3.5–5.0)
Alkaline Phosphatase: 71 U/L (ref 38–126)
Anion gap: 7 (ref 5–15)
BUN: 7 mg/dL — ABNORMAL LOW (ref 8–23)
CO2: 26 mmol/L (ref 22–32)
Calcium: 8.9 mg/dL (ref 8.9–10.3)
Chloride: 110 mmol/L (ref 98–111)
Creatinine, Ser: 0.68 mg/dL (ref 0.44–1.00)
GFR, Estimated: >60 mL/min (ref >60)
Glucose, Bld: 108 mg/dL — ABNORMAL HIGH (ref 70–99)
Potassium: 3.5 mmol/L (ref 3.5–5.1)
Sodium: 143 mmol/L (ref 135–145)
Total Bilirubin: 0.9 mg/dL (ref 0.3–1.2)
Total Protein: 6.7 g/dL (ref 6.5–8.1)

## 2021-12-15 LAB — MAGNESIUM: Magnesium: 1.5 mg/dL — ABNORMAL LOW (ref 1.7–2.4)

## 2021-12-15 MED ORDER — ACETAMINOPHEN 325 MG PO TABS
650.0000 mg | ORAL_TABLET | Freq: Four times a day (QID) | ORAL | Status: DC | PRN
  Administered 2021-12-17 – 2021-12-24 (×7): 650 mg via ORAL
  Filled 2021-12-15 (×7): qty 2

## 2021-12-15 MED ORDER — KETOROLAC TROMETHAMINE 15 MG/ML IJ SOLN
15.0000 mg | Freq: Once | INTRAMUSCULAR | Status: AC
  Administered 2021-12-15: 15 mg via INTRAVENOUS
  Filled 2021-12-15: qty 1

## 2021-12-15 MED ORDER — MELATONIN 3 MG PO TABS
3.0000 mg | ORAL_TABLET | Freq: Every evening | ORAL | Status: DC | PRN
  Administered 2021-12-18 – 2021-12-23 (×6): 3 mg via ORAL
  Filled 2021-12-15 (×6): qty 1

## 2021-12-15 MED ORDER — NALOXONE HCL 0.4 MG/ML IJ SOLN: 0.4000 mg | INTRAMUSCULAR | Status: DC | PRN

## 2021-12-15 MED ORDER — ACETAMINOPHEN 650 MG RE SUPP: 650.0000 mg | Freq: Four times a day (QID) | RECTAL | Status: DC | PRN

## 2021-12-15 MED ORDER — LACTATED RINGERS IV BOLUS
1000.0000 mL | Freq: Once | INTRAVENOUS | Status: AC
  Administered 2021-12-15: 1000 mL via INTRAVENOUS

## 2021-12-15 MED ORDER — MORPHINE SULFATE (PF) 4 MG/ML IV SOLN
4.0000 mg | Freq: Once | INTRAVENOUS | Status: AC
  Administered 2021-12-15: 4 mg via INTRAVENOUS
  Filled 2021-12-15: qty 1

## 2021-12-15 MED ORDER — FENTANYL CITRATE PF 50 MCG/ML IJ SOSY
100.0000 ug | PREFILLED_SYRINGE | Freq: Once | INTRAMUSCULAR | Status: AC
  Administered 2021-12-15: 100 ug via INTRAVENOUS
  Filled 2021-12-15: qty 2

## 2021-12-15 MED ORDER — ONDANSETRON HCL 4 MG/2ML IJ SOLN
4.0000 mg | Freq: Once | INTRAMUSCULAR | Status: DC
  Filled 2021-12-15: qty 2

## 2021-12-15 MED ORDER — FENTANYL CITRATE PF 50 MCG/ML IJ SOSY
25.0000 ug | PREFILLED_SYRINGE | INTRAMUSCULAR | Status: DC | PRN
  Administered 2021-12-15: 25 ug via INTRAVENOUS
  Filled 2021-12-15: qty 1

## 2021-12-15 MED ORDER — LACTATED RINGERS IV SOLN: INTRAVENOUS | Status: AC

## 2021-12-15 MED ORDER — METHOCARBAMOL 500 MG PO TABS
500.0000 mg | ORAL_TABLET | Freq: Three times a day (TID) | ORAL | Status: DC | PRN
  Administered 2021-12-16 – 2021-12-23 (×15): 500 mg via ORAL
  Filled 2021-12-15 (×16): qty 1

## 2021-12-15 MED ORDER — OXYCODONE-ACETAMINOPHEN 5-325 MG PO TABS
1.0000 | ORAL_TABLET | Freq: Once | ORAL | Status: AC
  Administered 2021-12-15: 1 via ORAL
  Filled 2021-12-15: qty 1

## 2021-12-15 MED ORDER — FENTANYL CITRATE PF 50 MCG/ML IJ SOSY
50.0000 ug | PREFILLED_SYRINGE | Freq: Once | INTRAMUSCULAR | Status: AC
  Administered 2021-12-15: 50 ug via INTRAVENOUS
  Filled 2021-12-15: qty 1

## 2021-12-15 MED ORDER — MORPHINE SULFATE (PF) 4 MG/ML IV SOLN
4.0000 mg | INTRAVENOUS | Status: DC | PRN
  Administered 2021-12-15 – 2021-12-16 (×3): 4 mg via INTRAVENOUS
  Filled 2021-12-15 (×3): qty 1

## 2021-12-15 MED ORDER — MORPHINE SULFATE (PF) 4 MG/ML IV SOLN
INTRAVENOUS | Status: AC
  Administered 2021-12-16: 4 mg
  Filled 2021-12-15: qty 1

## 2021-12-15 MED ORDER — ONDANSETRON HCL 4 MG/2ML IJ SOLN
4.0000 mg | Freq: Four times a day (QID) | INTRAMUSCULAR | Status: DC | PRN
  Administered 2021-12-15: 4 mg via INTRAVENOUS

## 2021-12-15 NOTE — ED Triage Notes (Signed)
Pt to ED via EMS from home. States pt was outside and then came inside with wet shoes and slid. She landed on her L knee. Dislocation noted. Pt has a hx of L knee replacement. EMS gave 200mg  fentanyl IV.   EMS reports  BP 118/70 HR 70 O2 sat 98% on RA

## 2021-12-15 NOTE — Progress Notes (Signed)
Orthopedic Tech Progress Note Patient Details:  Meghan Bowman 1960-05-25 [id number]  Patient ID: Meghan Bowman, female   DOB: 1960-04-22, 61 y.o.   MRN: [id number]  Meghan Bowman 12/15/2021, 8:54 PM Bulky dressing applied to left leg. Knee immobilizer applied to left leg.
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
## 2021-12-15 NOTE — Progress Notes (Addendum)
[facility]thopaedic Trauma [facility]   Orthopaedics aware of patient  S/p L TKA by Dr. Dion Saucier 10/2013 Ground level fall today with resultant L periprosthetic distal femur fracture   Unstable pattern   Will need ORIF/IMN  Full length L femur xrays   Admit to medicine  Full consult to follow, still in OR with acute traumas over at [facility]   Will make NPO after midnight in hopes that we can find OR availability tomorrow Ice and elevate  Bulky compressive dressing from foot to thigh and knee immobilizer  NWB L leg   Mearl Latin, PA-C [phone] (C) 12/15/2021, 7:24 PM  [facility] [address] [place] [address] [phone] Collier Bullock (F)        Patient ID: Meghan Bowman, female   DOB: 08-28-1960, 61 y.o.   MRN: [id number]
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
## 2021-12-16 ENCOUNTER — Inpatient Hospital Stay (HOSPITAL_COMMUNITY): Payer: Medicaid Other

## 2021-12-16 ENCOUNTER — Encounter (HOSPITAL_COMMUNITY): Payer: Self-pay | Admitting: Internal Medicine

## 2021-12-16 ENCOUNTER — Inpatient Hospital Stay (HOSPITAL_COMMUNITY): Payer: Medicaid Other | Admitting: Anesthesiology

## 2021-12-16 ENCOUNTER — Encounter (HOSPITAL_COMMUNITY)
Admission: EM | Disposition: A | Discharge status: Rehabilitation Facility | Payer: Self-pay | Source: Home / Self Care | Attending: Internal Medicine

## 2021-12-16 DIAGNOSIS — F411 Generalized anxiety disorder: Secondary | ICD-10-CM | POA: Diagnosis present

## 2021-12-16 DIAGNOSIS — K219 Gastro-esophageal reflux disease without esophagitis: Secondary | ICD-10-CM | POA: Diagnosis present

## 2021-12-16 DIAGNOSIS — F17210 Nicotine dependence, cigarettes, uncomplicated: Secondary | ICD-10-CM

## 2021-12-16 DIAGNOSIS — D72829 Elevated white blood cell count, unspecified: Secondary | ICD-10-CM | POA: Diagnosis not present

## 2021-12-16 DIAGNOSIS — S72402A Unspecified fracture of lower end of left femur, initial encounter for closed fracture: Secondary | ICD-10-CM

## 2021-12-16 DIAGNOSIS — W19XXXA Unspecified fall, initial encounter: Secondary | ICD-10-CM | POA: Insufficient documentation

## 2021-12-16 DIAGNOSIS — E785 Hyperlipidemia, unspecified: Secondary | ICD-10-CM | POA: Diagnosis present

## 2021-12-16 DIAGNOSIS — J4520 Mild intermittent asthma, uncomplicated: Secondary | ICD-10-CM | POA: Diagnosis present

## 2021-12-16 DIAGNOSIS — S728X2A Other fracture of left femur, initial encounter for closed fracture: Secondary | ICD-10-CM | POA: Diagnosis not present

## 2021-12-16 DIAGNOSIS — I10 Essential (primary) hypertension: Secondary | ICD-10-CM | POA: Diagnosis not present

## 2021-12-16 DIAGNOSIS — Z8679 Personal history of other diseases of the circulatory system: Secondary | ICD-10-CM

## 2021-12-16 DIAGNOSIS — I2510 Atherosclerotic heart disease of native coronary artery without angina pectoris: Secondary | ICD-10-CM

## 2021-12-16 DIAGNOSIS — I959 Hypotension, unspecified: Secondary | ICD-10-CM | POA: Insufficient documentation

## 2021-12-16 HISTORY — PX: ORIF FEMUR FRACTURE: SHX2119

## 2021-12-16 LAB — COMPREHENSIVE METABOLIC PANEL
ALT: 15 U/L (ref 0–44)
AST: 17 U/L (ref 15–41)
Albumin: 3.4 g/dL — ABNORMAL LOW (ref 3.5–5.0)
Alkaline Phosphatase: 65 U/L (ref 38–126)
Anion gap: 6 (ref 5–15)
BUN: 10 mg/dL (ref 8–23)
CO2: 29 mmol/L (ref 22–32)
Calcium: 8.5 mg/dL — ABNORMAL LOW (ref 8.9–10.3)
Chloride: 107 mmol/L (ref 98–111)
Creatinine, Ser: 0.71 mg/dL (ref 0.44–1.00)
GFR, Estimated: >60 mL/min (ref >60)
Glucose, Bld: 106 mg/dL — ABNORMAL HIGH (ref 70–99)
Potassium: 3.5 mmol/L (ref 3.5–5.1)
Sodium: 142 mmol/L (ref 135–145)
Total Bilirubin: 0.9 mg/dL (ref 0.3–1.2)
Total Protein: 5.8 g/dL — ABNORMAL LOW (ref 6.5–8.1)

## 2021-12-16 LAB — CBC WITH DIFFERENTIAL/PLATELET
Abs Immature Granulocytes: 0.04 10*3/uL (ref 0.00–0.07)
Basophils Absolute: 0 10*3/uL (ref 0.0–0.1)
Basophils Relative: 0 %
Eosinophils Absolute: 0.2 10*3/uL (ref 0.0–0.5)
Eosinophils Relative: 2 %
HCT: 32.2 % — ABNORMAL LOW (ref 36.0–46.0)
Hemoglobin: 10.4 g/dL — ABNORMAL LOW (ref 12.0–15.0)
Immature Granulocytes: 0 %
Lymphocytes Relative: 40 %
Lymphs Abs: 3.9 10*3/uL (ref 0.7–4.0)
MCH: 29.9 pg (ref 26.0–34.0)
MCHC: 32.3 g/dL (ref 30.0–36.0)
MCV: 92.5 fL (ref 80.0–100.0)
Monocytes Absolute: 1 10*3/uL (ref 0.1–1.0)
Monocytes Relative: 10 %
Neutro Abs: 4.6 10*3/uL (ref 1.7–7.7)
Neutrophils Relative %: 48 %
Platelets: 197 10*3/uL (ref 150–400)
RBC: 3.48 MIL/uL — ABNORMAL LOW (ref 3.87–5.11)
RDW: 12.5 % (ref 11.5–15.5)
WBC: 9.8 10*3/uL (ref 4.0–10.5)
nRBC: 0 % (ref 0.0–0.2)

## 2021-12-16 LAB — TYPE AND SCREEN
ABO/RH(D): B POS
Antibody Screen: NEGATIVE

## 2021-12-16 LAB — SURGICAL PCR SCREEN
MRSA, PCR: NEGATIVE
Staphylococcus aureus: NEGATIVE

## 2021-12-16 LAB — ABO/RH: ABO/RH(D): B POS

## 2021-12-16 LAB — PROTIME-INR
INR: 1.1 (ref 0.8–1.2)
Prothrombin Time: 14.3 seconds (ref 11.4–15.2)

## 2021-12-16 LAB — MAGNESIUM: Magnesium: 1.7 mg/dL (ref 1.7–2.4)

## 2021-12-16 SURGERY — OPEN REDUCTION INTERNAL FIXATION (ORIF) DISTAL FEMUR FRACTURE
Anesthesia: General | Site: Leg Upper | Laterality: Left

## 2021-12-16 MED ORDER — ONDANSETRON HCL 4 MG/2ML IJ SOLN
INTRAMUSCULAR | Status: AC
  Filled 2021-12-16: qty 2

## 2021-12-16 MED ORDER — BUPIVACAINE-EPINEPHRINE (PF) 0.5% -1:200000 IJ SOLN
INTRAMUSCULAR | Status: AC
  Filled 2021-12-16: qty 30

## 2021-12-16 MED ORDER — ATORVASTATIN CALCIUM 20 MG PO TABS
40.0000 mg | ORAL_TABLET | Freq: Every day | ORAL | Status: DC
  Administered 2021-12-16 – 2021-12-24 (×9): 40 mg via ORAL
  Filled 2021-12-16: qty 1
  Filled 2021-12-16 (×2): qty 2
  Filled 2021-12-16: qty 1
  Filled 2021-12-16: qty 2
  Filled 2021-12-16: qty 1
  Filled 2021-12-16: qty 2
  Filled 2021-12-16: qty 1
  Filled 2021-12-16: qty 2
  Filled 2021-12-16: qty 1

## 2021-12-16 MED ORDER — KETAMINE HCL 50 MG/5ML IJ SOSY
PREFILLED_SYRINGE | INTRAMUSCULAR | Status: AC
  Filled 2021-12-16: qty 5

## 2021-12-16 MED ORDER — OXYCODONE HCL 5 MG PO TABS
5.0000 mg | ORAL_TABLET | ORAL | Status: DC | PRN
  Administered 2021-12-16: 5 mg via ORAL
  Administered 2021-12-17 – 2021-12-21 (×18): 10 mg via ORAL
  Filled 2021-12-16 (×12): qty 2
  Filled 2021-12-16: qty 1
  Filled 2021-12-16 (×6): qty 2

## 2021-12-16 MED ORDER — PROPOFOL 10 MG/ML IV BOLUS
INTRAVENOUS | Status: AC
  Filled 2021-12-16: qty 20

## 2021-12-16 MED ORDER — DEXAMETHASONE SODIUM PHOSPHATE 10 MG/ML IJ SOLN
INTRAMUSCULAR | Status: AC
  Filled 2021-12-16: qty 1

## 2021-12-16 MED ORDER — FENTANYL CITRATE (PF) 100 MCG/2ML IJ SOLN
INTRAMUSCULAR | Status: AC
  Filled 2021-12-16: qty 2

## 2021-12-16 MED ORDER — PROMETHAZINE HCL 25 MG/ML IJ SOLN
INTRAMUSCULAR | Status: AC
  Administered 2021-12-16: 12.5 mg
  Filled 2021-12-16: qty 1

## 2021-12-16 MED ORDER — LACTATED RINGERS IV SOLN: INTRAVENOUS | Status: DC | PRN

## 2021-12-16 MED ORDER — MAGNESIUM SULFATE 2 GM/50ML IV SOLN
2.0000 g | Freq: Once | INTRAVENOUS | Status: AC
  Administered 2021-12-16: 2 g via INTRAVENOUS
  Filled 2021-12-16: qty 50

## 2021-12-16 MED ORDER — ACETAMINOPHEN 500 MG PO TABS: 1000.0000 mg | ORAL_TABLET | Freq: Once | ORAL | Status: DC

## 2021-12-16 MED ORDER — CEFAZOLIN SODIUM-DEXTROSE 2-4 GM/100ML-% IV SOLN
2.0000 g | Freq: Three times a day (TID) | INTRAVENOUS | Status: AC
  Administered 2021-12-16 – 2021-12-17 (×2): 2 g via INTRAVENOUS
  Filled 2021-12-16 (×2): qty 100

## 2021-12-16 MED ORDER — TRANEXAMIC ACID-NACL 1000-0.7 MG/100ML-% IV SOLN
1000.0000 mg | INTRAVENOUS | Status: AC
  Administered 2021-12-16: 1000 mg via INTRAVENOUS

## 2021-12-16 MED ORDER — ACETAMINOPHEN 10 MG/ML IV SOLN
INTRAVENOUS | Status: DC | PRN
  Administered 2021-12-16: 1000 mg via INTRAVENOUS

## 2021-12-16 MED ORDER — DEXTROSE-NACL 5-0.9 % IV SOLN: INTRAVENOUS | Status: DC

## 2021-12-16 MED ORDER — KETAMINE HCL 10 MG/ML IJ SOLN
INTRAMUSCULAR | Status: DC | PRN
  Administered 2021-12-16: 20 mg via INTRAVENOUS
  Administered 2021-12-16 (×2): 10 mg via INTRAVENOUS

## 2021-12-16 MED ORDER — DIPHENHYDRAMINE HCL 50 MG/ML IJ SOLN
INTRAMUSCULAR | Status: DC | PRN
  Administered 2021-12-16: 12.5 mg via INTRAVENOUS

## 2021-12-16 MED ORDER — FENTANYL CITRATE (PF) 100 MCG/2ML IJ SOLN
INTRAMUSCULAR | Status: DC | PRN
  Administered 2021-12-16 (×4): 50 ug via INTRAVENOUS

## 2021-12-16 MED ORDER — PROPOFOL 10 MG/ML IV BOLUS
INTRAVENOUS | Status: DC | PRN
  Administered 2021-12-16: 130 mg via INTRAVENOUS

## 2021-12-16 MED ORDER — BUPIVACAINE-EPINEPHRINE (PF) 0.5% -1:200000 IJ SOLN
INTRAMUSCULAR | Status: DC | PRN
  Administered 2021-12-16: 30 mL

## 2021-12-16 MED ORDER — ROCURONIUM BROMIDE 10 MG/ML (PF) SYRINGE
PREFILLED_SYRINGE | INTRAVENOUS | Status: AC
  Filled 2021-12-16: qty 10

## 2021-12-16 MED ORDER — LIDOCAINE HCL (PF) 2 % IJ SOLN
INTRAMUSCULAR | Status: AC
  Filled 2021-12-16: qty 5

## 2021-12-16 MED ORDER — CEFAZOLIN SODIUM-DEXTROSE 2-4 GM/100ML-% IV SOLN
INTRAVENOUS | Status: AC
  Filled 2021-12-16: qty 100

## 2021-12-16 MED ORDER — ONDANSETRON HCL 4 MG/2ML IJ SOLN
INTRAMUSCULAR | Status: DC | PRN
  Administered 2021-12-16: 4 mg via INTRAVENOUS

## 2021-12-16 MED ORDER — CEFAZOLIN SODIUM-DEXTROSE 2-4 GM/100ML-% IV SOLN
2.0000 g | INTRAVENOUS | Status: AC
  Administered 2021-12-16: 2 g via INTRAVENOUS

## 2021-12-16 MED ORDER — LIDOCAINE 2% (20 MG/ML) 5 ML SYRINGE
INTRAMUSCULAR | Status: DC | PRN
  Administered 2021-12-16: 60 mg via INTRAVENOUS

## 2021-12-16 MED ORDER — SERTRALINE HCL 100 MG PO TABS
100.0000 mg | ORAL_TABLET | Freq: Every day | ORAL | Status: DC
  Administered 2021-12-16 – 2021-12-23 (×8): 100 mg via ORAL
  Filled 2021-12-16 (×8): qty 1

## 2021-12-16 MED ORDER — ACETAMINOPHEN 10 MG/ML IV SOLN
INTRAVENOUS | Status: AC
  Filled 2021-12-16: qty 100

## 2021-12-16 MED ORDER — MIDAZOLAM HCL 2 MG/2ML IJ SOLN
INTRAMUSCULAR | Status: AC
  Filled 2021-12-16: qty 2

## 2021-12-16 MED ORDER — ROCURONIUM BROMIDE 10 MG/ML (PF) SYRINGE
PREFILLED_SYRINGE | INTRAVENOUS | Status: DC | PRN
  Administered 2021-12-16: 100 mg via INTRAVENOUS

## 2021-12-16 MED ORDER — LACTATED RINGERS IV SOLN: INTRAVENOUS | Status: DC

## 2021-12-16 MED ORDER — ALBUTEROL SULFATE (2.5 MG/3ML) 0.083% IN NEBU: 2.5000 mg | INHALATION_SOLUTION | RESPIRATORY_TRACT | Status: DC | PRN

## 2021-12-16 MED ORDER — MIDAZOLAM HCL 2 MG/2ML IJ SOLN
INTRAMUSCULAR | Status: DC | PRN
  Administered 2021-12-16: 2 mg via INTRAVENOUS

## 2021-12-16 MED ORDER — TRANEXAMIC ACID-NACL 1000-0.7 MG/100ML-% IV SOLN
INTRAVENOUS | Status: AC
  Filled 2021-12-16: qty 100

## 2021-12-16 MED ORDER — DIPHENHYDRAMINE HCL 50 MG/ML IJ SOLN
INTRAMUSCULAR | Status: AC
  Filled 2021-12-16: qty 1

## 2021-12-16 MED ORDER — PANTOPRAZOLE SODIUM 40 MG IV SOLR
40.0000 mg | INTRAVENOUS | Status: DC
  Administered 2021-12-16 – 2021-12-17 (×2): 40 mg via INTRAVENOUS
  Filled 2021-12-16 (×2): qty 10

## 2021-12-16 MED ORDER — DIPHENHYDRAMINE HCL 25 MG PO CAPS
25.0000 mg | ORAL_CAPSULE | Freq: Four times a day (QID) | ORAL | Status: DC | PRN
  Administered 2021-12-17 – 2021-12-22 (×7): 25 mg via ORAL
  Filled 2021-12-16 (×7): qty 1

## 2021-12-16 MED ORDER — 0.9 % SODIUM CHLORIDE (POUR BTL) OPTIME
TOPICAL | Status: DC | PRN
  Administered 2021-12-16: 1000 mL

## 2021-12-16 MED ORDER — FENTANYL CITRATE PF 50 MCG/ML IJ SOSY: 25.0000 ug | PREFILLED_SYRINGE | INTRAMUSCULAR | Status: DC | PRN

## 2021-12-16 MED ORDER — METOPROLOL SUCCINATE ER 25 MG PO TB24
25.0000 mg | ORAL_TABLET | Freq: Every day | ORAL | Status: DC
  Administered 2021-12-16 – 2021-12-24 (×9): 25 mg via ORAL
  Filled 2021-12-16 (×12): qty 1

## 2021-12-16 MED ORDER — VANCOMYCIN HCL 1000 MG IV SOLR
INTRAVENOUS | Status: DC | PRN
  Administered 2021-12-16: 1000 mg via TOPICAL

## 2021-12-16 MED ORDER — HYDROMORPHONE HCL 1 MG/ML IJ SOLN
1.0000 mg | INTRAMUSCULAR | Status: DC | PRN
  Administered 2021-12-16 – 2021-12-17 (×5): 1 mg via INTRAVENOUS
  Filled 2021-12-16 (×5): qty 1

## 2021-12-16 MED ORDER — DEXAMETHASONE SODIUM PHOSPHATE 10 MG/ML IJ SOLN
INTRAMUSCULAR | Status: DC | PRN
  Administered 2021-12-16: 10 mg via INTRAVENOUS

## 2021-12-16 MED ORDER — SUGAMMADEX SODIUM 200 MG/2ML IV SOLN
INTRAVENOUS | Status: DC | PRN
  Administered 2021-12-16: 200 mg via INTRAVENOUS

## 2021-12-16 SURGICAL SUPPLY — 64 items
APL PRP STRL LF DISP 70% ISPRP (MISCELLANEOUS) ×1
BAG COUNTER SPONGE SURGICOUNT (BAG) ×2 IMPLANT
BAG SPNG CNTER NS LX DISP (BAG) ×1
BIT DRILL 2.5X2.75 QC CALB (BIT) IMPLANT
BIT DRILL 3.5X5.5 QC CALB (BIT) IMPLANT
BIT DRILL 4.3 (BIT) ×1
BIT DRILL 4.3X300MM (BIT) IMPLANT
BIT DRILL LONG 3.3 (BIT) IMPLANT
BIT DRILL QC 3.3X195 (BIT) IMPLANT
BNDG ELASTIC 4X5.8 VLCR STR LF (GAUZE/BANDAGES/DRESSINGS) IMPLANT
BNDG ELASTIC 6X5.8 VLCR STR LF (GAUZE/BANDAGES/DRESSINGS) IMPLANT
CAP LOCK NCB (Cap) IMPLANT
CHLORAPREP W/TINT 26 (MISCELLANEOUS) ×2 IMPLANT
DRAPE C-ARM 42X120 X-RAY (DRAPES) ×2 IMPLANT
DRAPE C-ARMOR (DRAPES) IMPLANT
DRAPE HIP W/POCKET STRL (MISCELLANEOUS) ×2 IMPLANT
DRAPE IMP U-DRAPE 54X76 (DRAPES) ×2 IMPLANT
DRAPE ORTHO SPLIT 77X108 STRL (DRAPES) ×2
DRAPE SURG ORHT 6 SPLT 77X108 (DRAPES) ×4 IMPLANT
DRAPE U-SHAPE 47X51 STRL (DRAPES) ×2 IMPLANT
DRSG ADAPTIC 3X8 NADH LF (GAUZE/BANDAGES/DRESSINGS) IMPLANT
DRSG TEGADERM 4X4.75 (GAUZE/BANDAGES/DRESSINGS) IMPLANT
DRSG TEGADERM 6X8 (GAUZE/BANDAGES/DRESSINGS) IMPLANT
ELECT REM PT RETURN 15FT ADLT (MISCELLANEOUS) ×2 IMPLANT
GAUZE SPONGE 4X4 12PLY STRL (GAUZE/BANDAGES/DRESSINGS) ×2 IMPLANT
GLOVE BIO SURGEON STRL SZ7.5 (GLOVE) ×2 IMPLANT
GLOVE BIOGEL PI IND STRL 7.5 (GLOVE) ×2 IMPLANT
GLOVE BIOGEL PI IND STRL 8 (GLOVE) ×2 IMPLANT
GLOVE PI ORTHO PRO STRL SZ8 (GLOVE) ×2 IMPLANT
GOWN STRL REUS W/ TWL LRG LVL3 (GOWN DISPOSABLE) ×2 IMPLANT
GOWN STRL REUS W/ TWL XL LVL3 (GOWN DISPOSABLE) ×2 IMPLANT
GOWN STRL REUS W/TWL LRG LVL3 (GOWN DISPOSABLE) ×1
GOWN STRL REUS W/TWL XL LVL3 (GOWN DISPOSABLE) ×1
HOOD PEEL AWAY T7 (MISCELLANEOUS) ×4 IMPLANT
K-WIRE 2.0 (WIRE) ×1
K-WIRE FXSTD 280X2XNS SS (WIRE) ×1
KIT BASIN OR (CUSTOM PROCEDURE TRAY) ×2 IMPLANT
KIT TURNOVER KIT A (KITS) IMPLANT
KWIRE FXSTD 280X2XNS SS (WIRE) IMPLANT
MANIFOLD NEPTUNE II (INSTRUMENTS) ×2 IMPLANT
NS IRRIG 1000ML POUR BTL (IV SOLUTION) ×2 IMPLANT
PACK TOTAL JOINT (CUSTOM PROCEDURE TRAY) ×2 IMPLANT
PAD ARMBOARD 7.5X6 YLW CONV (MISCELLANEOUS) ×4 IMPLANT
PADDING CAST COTTON 6X4 STRL (CAST SUPPLIES) IMPLANT
PLATE DIST FEM 12H (Plate) IMPLANT
SCREW 5.0 32MM (Screw) IMPLANT
SCREW 5.0 60MM (Screw) IMPLANT
SCREW 5.0 70MM (Screw) IMPLANT
SCREW 5.0 80MM (Screw) IMPLANT
SCREW CORTICAL 3.5MM  42MM (Screw) ×1 IMPLANT
SCREW CORTICAL 3.5MM  46MM (Screw) ×1 IMPLANT
SCREW CORTICAL 3.5MM  55MM (Screw) ×1 IMPLANT
SCREW CORTICAL 3.5MM 38MM (Screw) IMPLANT
SCREW CORTICAL 3.5MM 42MM (Screw) IMPLANT
SCREW CORTICAL 3.5MM 46MM (Screw) IMPLANT
SCREW CORTICAL 3.5MM 55MM (Screw) IMPLANT
SCREW NCB 3.5X75X5X6.2XST (Screw) IMPLANT
SCREW NCB 4.0MX34M (Screw) IMPLANT
SCREW NCB 5.0X34MM (Screw) IMPLANT
SCREW NCB 5.0X75MM (Screw) ×2 IMPLANT
SUT ETHILON 3 0 PS 1 (SUTURE) IMPLANT
TOWEL OR 17X26 10 PK STRL BLUE (TOWEL DISPOSABLE) ×4 IMPLANT
TOWEL OR NON WOVEN STRL DISP B (DISPOSABLE) ×2 IMPLANT
TRAY FOLEY W/BAG SLVR 14FR LF (SET/KITS/TRAYS/PACK) IMPLANT

## 2021-12-16 NOTE — Anesthesia Postprocedure Evaluation (Signed)
Anesthesia Post Note  Patient: [name]  Procedure(s) Performed: OPEN REDUCTION INTERNAL FIXATION (ORIF) DISTAL FEMUR FRACTURE (Left: Leg Upper)     Patient location during evaluation: PACU Anesthesia Type: General Level of consciousness: awake and alert Pain management: pain level controlled Vital Signs Assessment: post-procedure vital signs reviewed and stable Respiratory status: spontaneous breathing, nonlabored ventilation, respiratory function stable and patient connected to nasal cannula oxygen Cardiovascular status: blood pressure returned to baseline and stable Postop Assessment: no apparent nausea or vomiting Anesthetic complications: no   No notable events documented.  Last Vitals:  Vitals:   12/16/21 1645 12/16/21 1700  BP: 118/83 119/80  Pulse: 73 97  Resp: (!) 9 11  Temp:  36.7 C  SpO2: 95% 97%    Last Pain:  Vitals:   12/16/21 1700  TempSrc:   PainSc: Asleep                 Shelton Silvas

## 2021-12-16 NOTE — Op Note (Addendum)
12/16/2021  3:51 PM  PATIENT:  Meghan Bowman    PRE-OPERATIVE DIAGNOSIS: Periprosthetic left distal femur fracture  POST-OPERATIVE DIAGNOSIS:  Same  PROCEDURE:  OPEN REDUCTION INTERNAL FIXATION (ORIF) DISTAL FEMUR FRACTURE  SURGEON:  Willaim Sheng, MD  PHYSICIAN ASSISTANT: Nehemiah Massed, PA-C, present and scrubbed throughout the case, critical for completion in a timely fashion, and for retraction, instrumentation, and closure.  ANESTHESIA:   General  PREOPERATIVE INDICATIONS:  Meghan Bowman is a  61 y.o. female who sustained a periprosthetic left distal femur fracture after a fall  The risks benefits and alternatives were discussed with the patient preoperatively including but not limited to the risks of infection, bleeding, nerve injury, cardiopulmonary complications, the need for revision surgery, among others, and the patient was willing to proceed.  ESTIMATED BLOOD LOSS: 250cc  OPERATIVE IMPLANTS: 3.5 cortical lag screws x2 across the fracture, 12 hole Biomet NCB plate 6 5.0 screws with locking caps distally, 3 5.0 and 1 4.0 screws proximally  OPERATIVE FINDINGS: Successful open reduction with fixation of left periprosthetic distal femur fracture mild residual recurvatum.  OPERATIVE PROCEDURE:  The patient was identified in the preoperative holding area. Consent was confirmed with the patient and their family and all questions were answered. The operative extremity was marked after confirmation with the patient. The patient was then brought back to the operating room by our anesthesia colleagues. The patient was placed under general anesthesia and was carefully transferred over to a radiolucent flattop table. They were secured to the bed and all bony prominences were padded.  A bump was placed under the operative hip. The operative extremity was then prepped and draped in usual sterile fashion. A timeout was performed to verify the patient, the procedure and extremity.  Preoperative antibiotics were dosed.   Fluoroscopy was used to identify the fracture. A lateral approach was made to the distal femur. It was taken down through the skin and the IT band was incised in line with the incision. The distal portion of the vastus lateralis was reflected off the IT band and the distal articular block of the lateral femoral condyle was exposed.  The fracture itself was exposed and visualized.  See [place], clamp was first applied medially and laterally to the plate and patient 3.5 lag screws were placed perpendicular to the fracture.  With this fixation felt we had significant residual recurvatum.  Elected to remove the lag screws.  Elected to instead try a large periarticular reduction clamp.  With this felt we had improved reduction of the fracture and better alignment in the AP and lateral planes.  Again 2 additional very well likely reduction and labs were placed in team: Ventricular fracture.  The fluoroscopy demonstrated subtotal reduction of the fracture.. I used fluoroscopy to identify the correct length plate to bridge the whole femoral shaft and proximal to the tip of the hip prothesis. I chose a 12-hole plate. The aiming arm was attached to the plate and slide submuscularly under the vastus to the proximal femur. The distal portion of the plate was placed flush against the lateral cortex of the distal articular block. A 3.8mm drill bit was used to position the proximal portion of the plate. A perfect lateral was obtained to show appropriate positioning of the plate.   The distal segment was drilled and 5.46mm cortical screws were placed in the distal fracture segment. A total of 3 screws were placed initially. A 5.0 mm screw was placed percutaneously in the shaft to  reduce the coronal alignment. A total of 4 shaft screws were placed to allow for a significant working length. Locking caps were placed in the shaft screws except for the two most proximal screw to prevent a stress  riser. The targeting guide was removed. Three more screws were placed in the distal segment. Fluoroscopy was used to confirm adequate reduction of the fracture and appropriate position of the plate and screws.   The incisions were irrigated with normal saline. A gram of vancomycin powder was placed in the incision around the plate. The IT band was closed with 0 stratafix. The skin was closed with 2-vicryl, 3-0 nylon. The incisions were dressed with a Adaptic, 4x4s and sterile cast padding and the leg was wrapped with a ACE wrap.  A knee immobilizer was applied to the knee.  The patient was then transferred to the regular floor bed and taken to the PACU in stable condition.  Post op recs: WB: TTWB LLE x6 weeks Abx: ancef x23 hours post op Imaging: PACU xrays Dressing: keep intact until follow up, change PRN if soiled or saturated. DVT prophylaxis: Eliquis 2.5mg  BID POD1 x4 weeks Follow up: 2 weeks after surgery for a wound check with Dr. Blanchie Dessert at [facility].  Address: [address], [place], [place] [address]  Office Phone: [phone]   Weber Cooks, MD Orthopaedic Surgery
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
## 2021-12-16 NOTE — Assessment & Plan Note (Addendum)
Continue blood pressure control with metoprolol.  Systolic blood pressure has been 116 to 125 mmHg.

## 2021-12-16 NOTE — Assessment & Plan Note (Signed)
Continue with sertraline  

## 2021-12-16 NOTE — Assessment & Plan Note (Signed)
Continue statin therapy.

## 2021-12-16 NOTE — TOC Initial Note (Signed)
Transition of Care [facility]) - Initial/Assessment Note    Patient Details  Name: Meghan Bowman MRN: [id number] Date of Birth: 04/15/1960  [facility]) CM/SW Contact:    Golda Acre, RN Phone Number: 12/16/2021, 7:29 AM  Clinical Narrative:                 Patient with fx'd femure may need snf placement.  Information for smoking added to AVS.  Expected Discharge Plan: Skilled Nursing Facility Barriers to Discharge: Continued Medical Work up   Patient Goals and CMS Choice Patient states their goals for this hospitalization and ongoing recovery are:: to go home CMS Medicare.gov Compare Post Acute Care list provided to:: Patient Choice offered to / list presented to : Patient  Expected Discharge Plan and Services Expected Discharge Plan: Skilled Nursing Facility   Discharge Planning Services: CM Consult Post Acute Care Choice: Skilled Nursing Facility Living arrangements for the past 2 months: Single Family Home                                      Prior Living Arrangements/Services Living arrangements for the past 2 months: Single Family Home Lives with:: Self Patient language and need for interpreter reviewed:: Yes Do you feel safe going back to the place where you live?: Yes            Criminal Activity/Legal Involvement Pertinent to Current Situation/Hospitalization: No - Comment as needed  Activities of Daily Living Home Assistive Devices/Equipment: None ADL Screening (condition at time of admission) Patient's cognitive ability adequate to safely complete daily activities?: Yes Is the patient deaf or have difficulty hearing?: No Does the patient have difficulty seeing, even when wearing glasses/contacts?: No Does the patient have difficulty concentrating, remembering, or making decisions?: No Patient able to express need for assistance with ADLs?: Yes Does the patient have difficulty dressing or bathing?: No Independently performs ADLs?:  Yes (appropriate for developmental age) Does the patient have difficulty walking or climbing stairs?: Yes Weakness of Legs: Left Weakness of Arms/Hands: None  Permission Sought/Granted                  Emotional Assessment Appearance:: Appears stated age Attitude/Demeanor/Rapport: Engaged Affect (typically observed): Calm Orientation: : Oriented to Self, Oriented to  Time, Oriented to Place, Oriented to Situation Alcohol / Substance Use: Tobacco Use, Illicit Drugs (current smoker, hx of drug use but not current) Psych Involvement: No (comment)  Admission diagnosis:  Closed fracture of left distal femur (HCC) [S72.402A] Other closed fracture of femur, unspecified laterality, unspecified portion of femur, initial encounter (HCC) [S72.8X9A] Patient Active Problem List   Diagnosis Date Noted   Hypotension 12/16/2021   History of essential hypertension 12/16/2021   Fall at home, initial encounter 12/16/2021   Hypomagnesemia 12/16/2021   Leukocytosis 12/16/2021   GAD (generalized anxiety disorder) 12/16/2021   Mild intermittent asthma 12/16/2021   GERD (gastroesophageal reflux disease) 12/16/2021   HLD (hyperlipidemia) 12/16/2021   Closed fracture of left distal femur (HCC) 12/15/2021   Transient neurological symptoms 09/05/2018   Chronic migraine 11/02/2017   COPD (chronic obstructive pulmonary disease) (HCC) 06/09/2016   CAD (coronary artery disease) 06/09/2016   Osteoarthritis of left knee 11/13/2013   Knee osteoarthritis 11/13/2013   HTN (hypertension) 10/02/2012   Hypokalemia 10/02/2012   Chronic headaches 07/25/2012   Substance addiction recovering 07/25/2012   Dyspnea 10/06/2011   Tobacco abuse 10/06/2011  KNEE PAIN, LEFT 01/13/2010   PTSD 08/10/2007   PCP:  Knox Royalty, MD Pharmacy:   [id number] [place], [facility] - [address] [place] [address] Phone: [phone] Fax: [phone]     Social Determinants of Health (SDOH)  Interventions    Readmission Risk Interventions   No data to display

## 2021-12-16 NOTE — Progress Notes (Signed)
Progress Note   Patient: Meghan Bowman ELF:[id number] DOB: 1960-02-24 DOA: 12/15/2021     1 DOS: the patient was seen and examined on 12/16/2021   Brief hospital course: Mrs. Texidor was admitted to the hospital with the working diagnosis of acute left periprosthetic distal femur fracture.   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 Patient tripped while ambulating, and landing on her left knee. Post fall she had severe pain at her left lower extremity, not able to move due to pain, not able to bear weight, prompting her to come to the hospital. On her initial physical examination her blood pressure was 105/74, HR 64, and 02 saturation 99%, lungs with no wheezing or rales, heart with S1 and S2 present and rhythmic, abdomen with no distention, no lower extremity edema.   Na 143, K 3,5 CL 110, bicarbonate 26 glucose 108 bun 7 cr 0,68  Mg 1,5  Wbc 11.9 hgb 12.1 plt 222  Left knee radiograph with acute oblique fracture through the distal femoral diaphysis extending to the level of the arthroplasty.   Patient has been placed on analgesics and orthopedics have been consulted. Plan for surgical intervention 12/16/21.   Assessment and Plan: * Closed fracture of left distal femur (HCC) Acute oblique fracture through the distal femoral diaphysis extending to the level of the arthroplasty.   Patient is NPO for orthopedic procedure.  Patient continue with pain at her lower extremity, on admission in the ED she did receive IV fentanyl 100 and then 50 mcg IV for pain control. On the medical ward she has been on morphine 4 mg as needed with not well controlled pain. Will change morphine for hydromorphone and continue close monitoring pain control. Add IV fluids with dextrose while NPO.  DVT prophylaxis when [facility] Fall per orthopedic team.  Continue PPI IV for now.    History of essential hypertension Systolic blood pressure 120 to 130 mmHg.  Plan to continue to hold on amlodipine to prevent hypotension Resume metoprolol to prevent rebound tachycardia. Continue blood pressure monitoring   Hypomagnesemia Follow up Mg continue to be below 2, will add 2 g Mag sulfate IV and continue to follow up on electrolytes.  Continue hydration with isotonic saline and dextrose.  Follow up renal function in am along with electrolytes Patient currently is NPO for surgical procedure.   Leukocytosis Reactive leukocytosis, follow up on wbs is 9,9 Mild anemia with Hgb at 10,4 possible chronic or hemodilution Follow up cell count in am.   HLD (hyperlipidemia) Continue statin therapy.   Mild intermittent asthma No signs of exacerbation   GERD (gastroesophageal reflux disease) Continue with pantoprazole IV for now and transition to po post surgery.   GAD (generalized anxiety disorder) Continue with sertraline         Subjective: Patient continue to have pain at her left lower extremity, worse with movement, she has been NPO past midnight with no nausea or vomiting   Physical Exam: Vitals:   12/16/21 0202 12/16/21 0233 12/16/21 0420 12/16/21 0813  BP: 122/65  134/76 128/88  Pulse: 60  73 63  Resp: 19  18 14   Temp: 98.2 F (36.8 C)  97.8 F (36.6 C) 97.8 F (36.6 C)  TempSrc: Oral  Oral Oral  SpO2: 96%  98% 97%  Weight:  90.4 kg    Height:  Neurology awake and alert ENT with mild pallor Cardiovascular with S1 and S2 present and rhythmic with no gallops, rubs or murmurs Respiratory with no wheezing, rales or rhonchi Abdomen with no distention Left lower extremity with orthopedic brace in place, mild local edema with no ecchymosis.,  Data Reviewed:    Family Communication: no family at the bedside   Disposition: Status is: Inpatient Remains inpatient appropriate because: pending surgical intervention   Planned Discharge Destination:  Home      Author: Coralie Keens, MD 12/16/2021 10:06 AM  For on call review [email or web address].

## 2021-12-16 NOTE — Assessment & Plan Note (Signed)
Reactive leukocytosis, follow up on wbs is 9,9 Mild anemia with Hgb at 10,4 possible chronic or hemodilution Follow up cell count in am.

## 2021-12-16 NOTE — Anesthesia Procedure Notes (Signed)
Procedure Name: Intubation Date/Time: 12/16/2021 1:07 PM  Performed by: Florene Route, CRNAPre-anesthesia Checklist: Patient identified, Emergency Drugs available, Suction available and Patient being monitored Patient Re-evaluated:Patient Re-evaluated prior to induction Oxygen Delivery Method: Circle system utilized Preoxygenation: Pre-oxygenation with 100% oxygen Induction Type: IV induction Ventilation: Mask ventilation without difficulty Laryngoscope Size: Miller and 2 Grade View: Grade I Tube type: Oral Tube size: 7.5 mm Number of attempts: 1 Airway Equipment and Method: Stylet Placement Confirmation: ETT inserted through vocal cords under direct vision, positive ETCO2 and breath sounds checked- equal and bilateral Secured at: 22 cm Tube secured with: Tape Dental Injury: Teeth and Oropharynx as per pre-operative assessment

## 2021-12-16 NOTE — Assessment & Plan Note (Addendum)
Renal function has remained stable and her electrolytes have been corrected. Patient is tolerating po well.  Check renal function and electrolytes as needed.

## 2021-12-16 NOTE — Assessment & Plan Note (Signed)
No signs of exacerbation  

## 2021-12-16 NOTE — Hospital Course (Addendum)
Meghan Bowman was admitted to the hospital with the working diagnosis of acute left periprosthetic distal femur fracture.   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 Patient tripped while ambulating, and landing on her left knee. Post fall she had severe pain at her left lower extremity, not able to move due to pain, not able to bear weight, prompting her to come to the hospital. On her initial physical examination her blood pressure was 105/74, HR 64, and 02 saturation 99%, lungs with no wheezing or rales, heart with S1 and S2 present and rhythmic, abdomen with no distention, no lower extremity edema.   Na 143, K 3,5 CL 110, bicarbonate 26 glucose 108 bun 7 cr 0,68  Mg 1,5  Wbc 11.9 hgb 12.1 plt 222  Left knee radiograph with acute oblique fracture through the distal femoral diaphysis extending to the level of the arthroplasty.   Patient has been placed on analgesics and orthopedics have been consulted.  11/22 open reduction internal fixation of distal femoral fracture (left).  11/23 plan to transfer patient to inpatient rehab.  11/24 continue medically stable for discharge to inpatient rehab.

## 2021-12-16 NOTE — Discharge Instructions (Addendum)
Orthopedic Discharge Instructions  Diet: As you were doing prior to hospitalization   Shower:  May shower but keep the wounds dry, use an occlusive plastic wrap, NO SOAKING IN TUB.  If the bandage gets wet, change with a clean dry gauze.  If you have a splint on, leave the splint in place and keep the splint dry with a plastic bag.  Dressing:  You may change your dressing 3-5 days after surgery, unless you have a splint.  If the dressing remains clean and dry it can also be left on until follow up. If you change the dressing replace with clean gauze and tape or ace wrap. If you have a splint, then just leave the splint in place and we will change your bandages during your first follow-up appointment.  If water gets in the splint or the splint gets saturated please call the clinic and we can see you to change your splint.  If you had hand or foot surgery, we will plan to remove your stitches in about 2 weeks in the office.  For all other surgeries, there are sticky tapes (steri-strips) on your wounds and all the stitches are absorbable.  Leave the steri-strips in place when changing your dressings, they will peel off with time, usually 2-3 weeks.  Activity:  Increase activity slowly as tolerated, but follow the weight bearing instructions below.  The rules on driving is that you can not be taking narcotics while you drive, and you must feel in control of the vehicle.    Weight Bearing:   touch down weight bearing left lower extremity.    Blood clot prevention (DVT Prophylaxis): After surgery you are at an increased risk for a blood clot. you were prescribed a blood thinner, eliquis 2.5mg , to be taken twice daily for a total of 4 weeks from surgery to help reduce your risk of getting a blood clot. This will help prevent a blood clot. Signs of a pulmonary embolus (blood clot in the lungs) include sudden short of breath, feeling lightheaded or dizzy, chest pain with a deep breath, rapid pulse rapid  breathing. Signs of a blood clot in your arms or legs include new unexplained swelling and cramping, warm, red or darkened skin around the painful area. Please call the office or 911 right away if these signs or symptoms develop. To prevent constipation: you may use a stool softener such as -  Colace (over the counter) 100 mg by mouth twice a day  Drink plenty of fluids (prune juice may be helpful) and high fiber foods Miralax (over the counter) for constipation as needed.    Itching:  If you experience itching with your medications, try taking only a single pain pill, or even half a pain pill at a time.  You may take up to 10 pain pills per day, and you can also use benadryl over the counter for itching or also to help with sleep.   Precautions:  If you experience chest pain or shortness of breath - call 911 immediately for transfer to the hospital emergency department!!   Call office [phone]) for the following: Temperature greater than 101F Persistent nausea and vomiting Severe uncontrolled pain Redness, tenderness, or signs of infection (pain, swelling, redness, odor or green/yellow discharge around the site) Difficulty breathing, headache or visual disturbances Hives Persistent dizziness or light-headedness Extreme fatigue Any other questions or concerns you may have after discharge  In an emergency, call 911 or go to an Emergency Department at a  nearby hospital  Follow- Up Appointment:  Please call for an appointment to be seen approximately 2-3 week after surgery in [facility] with your surgeon Dr. Weber Cooks - [phone] Address: [address], [place], [place] [address] ______________________________________________________________________  Information on my medicine - ELIQUIS (apixaban)  This medication education was reviewed with me or my healthcare representative as part of my discharge preparation.  T  Why was Eliquis prescribed for you? Eliquis  was prescribed for you to reduce the risk of blood clots forming after orthopedic surgery.    What do You need to know about Eliquis? Take your Eliquis TWICE DAILY - one tablet in the morning and one tablet in the evening with or without food.  It would be best to take the dose about the same time each day.  If you have difficulty swallowing the tablet whole please discuss with your pharmacist how to take the medication safely.  Take Eliquis exactly as prescribed by your doctor and DO NOT stop taking Eliquis without talking to the doctor who prescribed the medication.  Stopping without other medication to take the place of Eliquis may increase your risk of developing a clot.  After discharge, you should have regular check-up appointments with your healthcare provider that is prescribing your Eliquis.  What do you do if you miss a dose? If a dose of ELIQUIS is not taken at the scheduled time, take it as soon as possible on the same day and twice-daily administration should be resumed.  The dose should not be doubled to make up for a missed dose.  Do not take more than one tablet of ELIQUIS at the same time.  Important Safety Information A possible side effect of Eliquis is bleeding. You should call your healthcare provider right away if you experience any of the following: Bleeding from an injury or your nose that does not stop. Unusual colored urine (red or dark brown) or unusual colored stools (red or black). Unusual bruising for unknown reasons. A serious fall or if you hit your head (even if there is no bleeding).  Some medicines may interact with Eliquis and might increase your risk of bleeding or clotting while on Eliquis. To help avoid this, consult your healthcare provider or pharmacist prior to using any new prescription or non-prescription medications, including herbals, vitamins, non-steroidal anti-inflammatory drugs (NSAIDs) and supplements.  This website has more  information on Eliquis (apixaban): http://www.eliquis.com/eliquis/home

## 2021-12-16 NOTE — Transfer of Care (Signed)
Immediate Anesthesia Transfer of Care Note  Patient: Meghan Bowman  Procedure(s) Performed: OPEN REDUCTION INTERNAL FIXATION (ORIF) DISTAL FEMUR FRACTURE (Left: Leg Upper)  Patient Location: PACU  Anesthesia Type:General  Level of Consciousness: awake, alert , oriented, and patient cooperative  Airway & Oxygen Therapy: Patient Spontanous Breathing and Patient connected to face mask oxygen  Post-op Assessment: Report given to RN and Post -op Vital signs reviewed and stable  Post vital signs: Reviewed and stable  Last Vitals:  Vitals Value Taken Time  BP 157/78 12/16/21 1600  Temp    Pulse 74 12/16/21 1603  Resp 19 12/16/21 1603  SpO2 98 % 12/16/21 1603  Vitals shown include unvalidated device data.  Last Pain:  Vitals:   12/16/21 1202  TempSrc: Oral  PainSc:       Patients Stated Pain Goal: 0 (12/16/21 1032)  Complications: No notable events documented.

## 2021-12-16 NOTE — Anesthesia Preprocedure Evaluation (Addendum)
Anesthesia Evaluation  Patient identified by MRN, date of birth, ID band Patient awake    Reviewed: Allergy & Precautions, NPO status , Patient's Chart, lab work & pertinent test results, reviewed documented beta blocker date and time   Airway Mallampati: I  TM Distance: >3 FB Neck ROM: Full    Dental  (+) Loose, Missing, Chipped, Dental Advisory Given,    Pulmonary asthma , sleep apnea , COPD,  COPD inhaler, Current Smoker and Patient abstained from smoking.   Pulmonary exam normal breath sounds clear to auscultation       Cardiovascular hypertension, Pt. on home beta blockers and Pt. on medications + CAD  Normal cardiovascular exam Rhythm:Regular Rate:Normal  TTE 2020 1. The left ventricle has normal systolic function with an ejection  fraction of 60-65%. The cavity size was normal. There is mild asymmetric  left ventricular hypertrophy. Left ventricular diastolic parameters were  normal.   2. The right ventricle has normal systolic function. The cavity was  normal. There is no increase in right ventricular wall thickness.   3. Left atrial size was mildly dilated.   4. The mitral valve was not well visualized. Mild thickening of the  mitral valve leaflet.   5. The tricuspid valve is grossly normal.   6. The aortic valve is tricuspid. Mild thickening of the aortic valve.  Sclerosis without any evidence of stenosis of the aortic valve.   7. The aorta is normal in size and structure.   LHC 2017  Dist LAD lesion, 40% stenosed.  The left ventricular systolic function is normal.     Neuro/Psych  Headaches PSYCHIATRIC DISORDERS Anxiety Depression       GI/Hepatic hiatal hernia,GERD  ,,(+)     substance abuse  alcohol use  Endo/Other  negative endocrine ROS    Renal/GU negative Renal ROS  negative genitourinary   Musculoskeletal  (+) Arthritis ,    Abdominal   Peds  Hematology negative hematology ROS (+)    Anesthesia Other Findings   Reproductive/Obstetrics                              Anesthesia Physical Anesthesia Plan  ASA: 3  Anesthesia Plan: General   Post-op Pain Management: Ketamine IV* and Ofirmev IV (intra-op)*   Induction: Intravenous  PONV Risk Score and Plan: 2 and Midazolam, Dexamethasone and Ondansetron  Airway Management Planned: Oral ETT  Additional Equipment:   Intra-op Plan:   Post-operative Plan: Extubation in OR  Informed Consent: I have reviewed the patients History and Physical, chart, labs and discussed the procedure including the risks, benefits and alternatives for the proposed anesthesia with the patient or authorized representative who has indicated his/her understanding and acceptance.     Dental advisory given  Plan Discussed with: CRNA  Anesthesia Plan Comments:         Anesthesia Quick Evaluation

## 2021-12-16 NOTE — Assessment & Plan Note (Addendum)
Acute oblique fracture through the distal femoral diaphysis extending to the level of the arthroplasty.   Sp open reduction and internal fixation with good toleration.   Plan to continue pain control with acetaminophen, and as needed oxycodone and hydromorphone. PRN methocarbamol.   Out of bed as tolerated per PT and OT.  Continue DVT and GI prophylaxis  Pending transfer to inpatient rehab.  Reactive leukocytosis, no clinical signs of bacterial infection.

## 2021-12-16 NOTE — Assessment & Plan Note (Signed)
Continue with pantoprazole po.

## 2021-12-17 DIAGNOSIS — I10 Essential (primary) hypertension: Secondary | ICD-10-CM | POA: Diagnosis not present

## 2021-12-17 DIAGNOSIS — S72492A Other fracture of lower end of left femur, initial encounter for closed fracture: Secondary | ICD-10-CM | POA: Diagnosis not present

## 2021-12-17 DIAGNOSIS — D72829 Elevated white blood cell count, unspecified: Secondary | ICD-10-CM | POA: Diagnosis not present

## 2021-12-17 LAB — CBC
HCT: 30.8 % — ABNORMAL LOW (ref 36.0–46.0)
Hemoglobin: 9.7 g/dL — ABNORMAL LOW (ref 12.0–15.0)
MCH: 29.9 pg (ref 26.0–34.0)
MCHC: 31.5 g/dL (ref 30.0–36.0)
MCV: 95.1 fL (ref 80.0–100.0)
Platelets: 182 10*3/uL (ref 150–400)
RBC: 3.24 MIL/uL — ABNORMAL LOW (ref 3.87–5.11)
RDW: 12.2 % (ref 11.5–15.5)
WBC: 14 10*3/uL — ABNORMAL HIGH (ref 4.0–10.5)
nRBC: 0 % (ref 0.0–0.2)

## 2021-12-17 LAB — BASIC METABOLIC PANEL
Anion gap: 6 (ref 5–15)
BUN: 9 mg/dL (ref 8–23)
CO2: 24 mmol/L (ref 22–32)
Calcium: 8.5 mg/dL — ABNORMAL LOW (ref 8.9–10.3)
Chloride: 109 mmol/L (ref 98–111)
Creatinine, Ser: 0.71 mg/dL (ref 0.44–1.00)
GFR, Estimated: >60 mL/min (ref >60)
Glucose, Bld: 182 mg/dL — ABNORMAL HIGH (ref 70–99)
Potassium: 4.1 mmol/L (ref 3.5–5.1)
Sodium: 139 mmol/L (ref 135–145)

## 2021-12-17 LAB — MAGNESIUM: Magnesium: 2.1 mg/dL (ref 1.7–2.4)

## 2021-12-17 MED ORDER — HYDROMORPHONE HCL 1 MG/ML IJ SOLN
1.0000 mg | Freq: Four times a day (QID) | INTRAMUSCULAR | Status: DC | PRN
  Administered 2021-12-17 – 2021-12-20 (×10): 1 mg via INTRAVENOUS
  Filled 2021-12-17 (×11): qty 1

## 2021-12-17 MED ORDER — PANTOPRAZOLE SODIUM 40 MG PO TBEC
40.0000 mg | DELAYED_RELEASE_TABLET | Freq: Every day | ORAL | Status: DC
  Administered 2021-12-18 – 2021-12-24 (×7): 40 mg via ORAL
  Filled 2021-12-17 (×7): qty 1

## 2021-12-17 MED ORDER — POLYETHYLENE GLYCOL 3350 17 G PO PACK
17.0000 g | PACK | Freq: Every day | ORAL | Status: DC
  Administered 2021-12-17 – 2021-12-19 (×3): 17 g via ORAL
  Filled 2021-12-17 (×7): qty 1

## 2021-12-17 NOTE — Progress Notes (Signed)
Inpatient Rehab Admissions Coordinator:  ? ?Per therapy recommendations,  patient was screened for CIR candidacy by Chontel Warning, MS, CCC-SLP. At this time, Pt. Appears to be a a potential candidate for CIR. I will place   order for rehab consult per protocol for full assessment. Please contact me any with questions. ? ?Dallana Mavity, MS, CCC-SLP ?Rehab Admissions Coordinator  ?[phone] (celll) ?[phone] (office) ? ?

## 2021-12-17 NOTE — Progress Notes (Signed)
     Subjective:  Patient reports pain is well controlled today.  States she feels a lot better than she did before the surgery.  She denies distal numbness and tingling.  She is eager to work with therapy today.  Discussed restrictions of touchdown weightbearing.  She is currently in a straight knee immobilizer work on getting her a Bledsoe knee immobilizer brace which we can gradually start to unlock range of motion of the knee.  Objective:   VITALS:   Vitals:   12/16/21 2054 12/16/21 2142 12/17/21 0129 12/17/21 0443  BP: 111/68 130/79 111/72 138/71  Pulse: 69 62 63 63  Resp: 16  19 18   Temp: 98.3 F (36.8 C)  98.5 F (36.9 C) 98.4 F (36.9 C)  TempSrc: Oral  Oral Oral  SpO2: 97%  96% 97%  Weight:      Height:        Sensation intact distally Intact pulses distally Dorsiflexion/Plantar flexion intact Incision: dressing C/D/I Compartment soft   Lab Results  Component Value Date   WBC 14.0 (H) 12/17/2021   HGB 9.7 (L) 12/17/2021   HCT 30.8 (L) 12/17/2021   MCV 95.1 12/17/2021   PLT 182 12/17/2021   BMET    Component Value Date/Time   NA 139 12/17/2021 0402   K 4.1 12/17/2021 0402   CL 109 12/17/2021 0402   CO2 24 12/17/2021 0402   GLUCOSE 182 (H) 12/17/2021 0402   BUN 9 12/17/2021 0402   CREATININE 0.71 12/17/2021 0402   CALCIUM 8.5 (L) 12/17/2021 0402   GFRNONAA >60 12/17/2021 0402      Xray: X-rays demonstrate interval fixation of her right side to distal femur fracture.  Well aligned in coronal plane.  Mild residual recurvatum on the lateral x-ray  Assessment/Plan: 1 Day Post-Op   Principal Problem:   Closed fracture of left distal femur (HCC) Active Problems:   History of essential hypertension   Hypomagnesemia   Leukocytosis   GAD (generalized anxiety disorder)   Mild intermittent asthma   GERD (gastroesophageal reflux disease)   HLD (hyperlipidemia)   Status post ORIF left periprosthetic distal femur fracture 12/16/2021  Post op  recs: WB: TTWB LLE x6 weeks Abx: ancef x23 hours post op Imaging: PACU xrays Dressing: keep intact until follow up, change PRN if soiled or saturated. DVT prophylaxis: Eliquis 2.5mg  BID POD1 x4 weeks Follow up: 2 weeks after surgery for a wound check with Dr. 12/18/2021 at [facility].  Address: [address], [place], [place]  Office Phone: [phone]   [phone] 12/17/2021, 8:06 AM   12/19/2021, MD  Contact information:   [phone] 7am-5pm epic message Dr. [email or web address], or call office for patient follow up: [phone] After hours and holidays please check [email or web address] for group call information for [facility]

## 2021-12-17 NOTE — Progress Notes (Signed)
Orthopedic Tech Progress Note Patient Details:  MAKITA BLOW 01/27/1960 [id number]  Patient ID: [name], female   DOB: 04/23/1960, 61 y.o.   MRN: [id number]  Kizzie Fantasia 12/17/2021, 5:45 PM Bledsoe ordered from [facility]. Will be applied 11/24 in am.

## 2021-12-17 NOTE — Evaluation (Addendum)
Physical Therapy Evaluation Patient Details Name: Meghan Bowman MRN: [id number] DOB: 04-28-1960 Today's Date: 12/17/2021  History of Present Illness  Pt is a 61 y/o female admitted 11/21 after a fall at home resulting in periprosthetic fracture of the distal left femur. Pt underwent ORIF of L LE on 11/22. PMH: HTN, HLD, asthma, L TKA.  Clinical Impression  Pt admitted with/for fall with periprosthetic distal L femur fx, s/p ORIF.  Pt now TDWB on the L LE, needing min guard to mod assist  of 1-2 persons for basic mobility and gait.Marland Kitchen  Pt currently limited functionally due to the problems listed. ( See problems list.)   Pt will benefit from PT to maximize function and safety in order to get ready for next venue listed below.        Recommendations for follow up therapy are one component of a multi-disciplinary discharge planning process, led by the attending physician.  Recommendations may be updated based on patient status, additional functional criteria and insurance authorization.  Follow Up Recommendations Acute inpatient rehab (3hours/day)      Assistance Recommended at Discharge Frequent or constant Supervision/Assistance  Patient can return home with the following  A little help with walking and/or transfers;A little help with bathing/dressing/bathroom;Assistance with feeding;Assist for transportation;Help with stairs or ramp for entrance    Equipment Recommendations None recommended by PT  Recommendations for Other Services  Rehab consult    Functional Status Assessment Patient has had a recent decline in their functional status and demonstrates the ability to make significant improvements in function in a reasonable and predictable amount of time.     Precautions / Restrictions Precautions Precautions: Fall Required Braces or Orthoses: Other Brace Other Brace: currently in KI but orders to have hinged knee brace locked in extension for LLE Restrictions Weight Bearing  Restrictions: Yes LLE Weight Bearing: Touchdown weight bearing      Mobility  Bed Mobility Overal bed mobility: Needs Assistance Bed Mobility: Supine to Sit     Supine to sit: Min guard     General bed mobility comments: pt able to use BUE to assist LLE to EOB    Transfers Overall transfer level: Needs assistance Equipment used: Rolling walker (2 wheels), None Transfers: Sit to/from Stand, Bed to chair/wheelchair/BSC Sit to Stand: Mod assist          Lateral/Scoot Transfers: Min assist General transfer comment: Trialed standing from bedside using RW though unsucessful and unable to lift bottom despite cues for hand placement alternatives. opted for scoot transfer to drop arm recliner with cues for sequencing and light assist to lift bottom. Pt able to successfully stand from recliner pushing from armrests with Mod  A though difficulty maintaining WB precautions initially, able to stand for 10 sec    Ambulation/Gait Ambulation/Gait assistance: Mod assist, +2 safety/equipment Gait Distance (Feet): 4 Feet (with 3 short "swing to" steps in the RW) Assistive device: Rolling walker (2 wheels) Gait Pattern/deviations: Step-to pattern   Gait velocity interpretation: <1.31 ft/sec, indicative of household ambulator   General Gait Details: "swing to" pattern, anxious guarded swings with L LE out in front, staying NWB  all with mod assist and +2 safety.  Stairs            Wheelchair Mobility    Modified Rankin (Stroke Patients Only)       Balance Overall balance assessment: Needs assistance Sitting-balance support: No upper extremity supported, Feet supported Sitting balance-Leahy Scale: Fair     Standing balance support:  Bilateral upper extremity supported, During functional activity, Reliant on assistive device for balance Standing balance-Leahy Scale: Poor Standing balance comment: pt able to maintain NWB on L LE in the RW with min/mod external support.                              Pertinent Vitals/Pain Pain Assessment Pain Assessment: Faces Faces Pain Scale: Hurts little more Pain Location: LLE Pain Descriptors / Indicators: Grimacing, Guarding Pain Intervention(s): Monitored during session    Home Living Family/patient expects to be discharged to:: Private residence Living Arrangements: Alone Available Help at Discharge: Family;Available PRN/intermittently Type of Home: House Home Access: Stairs to enter Entrance Stairs-Rails: Psychiatric nurse of Steps: 3   Home Layout: One level Home Equipment: Investment banker, operational (2 wheels) (still uses BSC as shower chair (says its rusted) but unsure if she still has the other equipment) Additional Comments: family lives in [place], [place]    Prior Function Prior Level of Function : Independent/Modified Independent;Driving             Mobility Comments: typically no use of AD ADLs Comments: Able to complete ADLs, IADLs without issue     Hand Dominance   Dominant Hand: Right    Extremity/Trunk Assessment   Upper Extremity Assessment Upper Extremity Assessment: Overall WFL for tasks assessed    Lower Extremity Assessment Lower Extremity Assessment: LLE deficits/detail LLE Deficits / Details: In immobilizer, moves weakly against gravity  With cuing able to maintain TDWB to take a few "swing to " steps. LLE Coordination: decreased fine motor    Cervical / Trunk Assessment Cervical / Trunk Assessment: Normal  Communication   Communication: No difficulties  Cognition Arousal/Alertness: Awake/alert Behavior During Therapy: Restless, Anxious, Impulsive Overall Cognitive Status: Impaired/Different from baseline Area of Impairment: Attention, Safety/judgement, Awareness, Problem solving                   Current Attention Level: Selective     Safety/Judgement: Decreased awareness of deficits, Decreased awareness of safety Awareness:  Emergent Problem Solving: Difficulty sequencing, Requires verbal cues, Requires tactile cues General Comments: pleasant and eager to participate, very easily distracted and impulsive with movements at times. cues to attend to tasks, implement safety precautions and sequence        General Comments      Exercises     Assessment/Plan    PT Assessment Patient needs continued PT services  PT Problem List Decreased strength;Decreased mobility;Decreased activity tolerance;Decreased knowledge of use of DME;Pain;Decreased balance;Decreased coordination       PT Treatment Interventions DME instruction;Gait training;Functional mobility training;Therapeutic activities;Balance training;Patient/family education    PT Goals (Current goals can be found in the Care Plan section)  Acute Rehab PT Goals Patient Stated Goal: Get back to my PLOF/ Independent PT Goal Formulation: With patient Time For Goal Achievement: 12/31/21 Potential to Achieve Goals: Good    Frequency Min 4X/week     Co-evaluation               AM-PAC PT "6 Clicks" Mobility  Outcome Measure Help needed turning from your back to your side while in a flat bed without using bedrails?: A Little Help needed moving from lying on your back to sitting on the side of a flat bed without using bedrails?: A Little Help needed moving to and from a bed to a chair (including a wheelchair)?: A Lot Help needed standing up from a chair using your  arms (e.g., wheelchair or bedside chair)?: A Lot Help needed to walk in hospital room?: Total Help needed climbing 3-5 steps with a railing? : Total 6 Click Score: 12    End of Session   Activity Tolerance: Patient tolerated treatment well;Patient limited by fatigue Patient left: in chair;with call bell/phone within reach;with family/visitor present Nurse Communication: Mobility status PT Visit Diagnosis: Other abnormalities of gait and mobility (R26.89);Pain;Difficulty in walking, not  elsewhere classified (R26.2) Pain - Right/Left: Left Pain - part of body: Leg    Time: 1438-8875 PT Time Calculation (min) (ACUTE ONLY): 17 min   Charges:   PT Evaluation $PT Eval Moderate Complexity: 1 Mod          12/17/2021  Jacinto Halim., PT Acute Rehabilitation Services [phone]  (office)  Meghan Bowman 12/17/2021, 12:17 PM

## 2021-12-17 NOTE — Progress Notes (Signed)
Progress Note   Patient: Meghan Bowman IRW:[id number] DOB: 06-30-1960 DOA: 12/15/2021     2 DOS: the patient was seen and examined on 12/17/2021   Brief hospital course: Meghan Bowman was admitted to the hospital with the working diagnosis of acute left periprosthetic distal femur fracture.   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 Patient tripped while ambulating, and landing on her left knee. Post fall she had severe pain at her left lower extremity, not able to move due to pain, not able to bear weight, prompting her to come to the hospital. On her initial physical examination her blood pressure was 105/74, HR 64, and 02 saturation 99%, lungs with no wheezing or rales, heart with S1 and S2 present and rhythmic, abdomen with no distention, no lower extremity edema.   Na 143, K 3,5 CL 110, bicarbonate 26 glucose 108 bun 7 cr 0,68  Mg 1,5  Wbc 11.9 hgb 12.1 plt 222  Left knee radiograph with acute oblique fracture through the distal femoral diaphysis extending to the level of the arthroplasty.   Patient has been placed on analgesics and orthopedics have been consulted.  11/22 open reduction internal fixation of distal femoral fracture (left).  11/23 plan to transfer patient to inpatient rehab.   Assessment and Plan: * Closed fracture of left distal femur (HCC) Acute oblique fracture through the distal femoral diaphysis extending to the level of the arthroplasty.   Sp open reduction and internal fixation with good toleration.   Her pain has improved, pt and ot have recommended patient to continue therapy in inpatient rehab.  Plan to continue pain control with acetaminophen, and as needed oxycodone and hydromorphone.  Out of bed as tolerated per PT and OT.  Continue DVT and GI prophylaxis   Essential hypertension Blood pressure has been stable  116 to 139 mHg systolic.  Plan to continue with metoprolol for now and continue close blood pressure monitoring.     Hypomagnesemia Renal function continue to be stable, renal function with serum cr at 0,71 K is 4.1 and serum bicarbonate at 24 Mg is 2,1 and Na 139. Plan to discontinue IV fluids, patient is tolerating po well.   Leukocytosis Reactive leukocytosis, cell count is 14,0  Mild anemia with Hgb at 9.7 possible chronic anemia.   HLD (hyperlipidemia) Continue statin therapy.   Mild intermittent asthma No signs of exacerbation   GERD (gastroesophageal reflux disease) Continue with pantoprazole po.   GAD (generalized anxiety disorder) Continue with sertraline         Subjective: Patient is feeling better. Her lower extremity pain is better controlled.   Physical Exam: Vitals:   12/16/21 2142 12/17/21 0129 12/17/21 0443 12/17/21 1257  BP: 130/79 111/72 138/71 116/76  Pulse: 62 63 63 (!) 57  Resp:  19 18 18   Temp:  98.5 F (36.9 C) 98.4 F (36.9 C) 97.8 F (36.6 C)  TempSrc:  Oral Oral Oral  SpO2:  96% 97% 95%  Weight:      Height:       Neurology awake and alert ENT with no pallor Cardiovascular with S1 and S2 present and rhythmic Respiratory with no rales and no wheezing Abdomen with no distention  Left lower extremity in a orthopedic brace  Data Reviewed:    Family Communication: no family at the bedside   Disposition: Status is: Inpatient  Remains inpatient appropriate because: pending transfer to rehab   Planned Discharge Destination: inpatient rehab      Author: Coralie Keens, MD 12/17/2021 2:04 PM  For on call review [email or web address].

## 2021-12-17 NOTE — Evaluation (Signed)
Occupational Therapy Evaluation Patient Details Name: Meghan Bowman MRN: [id number] DOB: 1960/11/07 Today's Date: 12/17/2021   History of Present Illness Pt is a 61 y/o female admitted after a fall at home resulting in periprosthetic fracture of the distal left femur. Pt underwent ORIF of L LE on 11/22. PMH: HTN, HLD, asthma, L TKA.   Clinical Impression   PTA, pt lives alone, typically Independent in all daily tasks without need for AD. Pt presents now with deficits in LLE pain, strength, standing balance and safety awareness. Pt premedicated prior to eval and required consistent cues for attention to tasks and safety precautions. Pt unable to stand from bedside with RW this AM but able to scoot to drop arm recliner with Min A and stand from recliner with Mod A. Unable to progress RW mobility yet. Pt requires Setup for UB ADLs, Mod-Max A for LB ADLs. Discussed helpful DME for use at home, consideration of temporary ramp with continued follow up needed. At this time, pt unsafe to DC home alone - would progress quickly with AIR level therapies. Plan to address [facility] transfers and AE education for LB ADLs in next sessions.       Recommendations for follow up therapy are one component of a multi-disciplinary discharge planning process, led by the attending physician.  Recommendations may be updated based on patient status, additional functional criteria and insurance authorization.   Follow Up Recommendations  Acute inpatient rehab (3hours/day)     Assistance Recommended at Discharge Frequent or constant Supervision/Assistance  Patient can return home with the following A lot of help with walking and/or transfers;A lot of help with bathing/dressing/bathroom;Assistance with cooking/housework;Assist for transportation;Help with stairs or ramp for entrance    Functional Status Assessment  Patient has had a recent decline in their functional status and demonstrates the ability to make significant  improvements in function in a reasonable and predictable amount of time.  Equipment Recommendations  Wheelchair (measurements OT);Wheelchair cushion (measurements OT);Other (comment) (drop arm BSC)    Recommendations for Other Services Rehab consult     Precautions / Restrictions Precautions Precautions: Fall Required Braces or Orthoses: Other Brace Other Brace: currently in KI but orders to have hinged knee brace locked in extension for LLE Restrictions Weight Bearing Restrictions: Yes LLE Weight Bearing: Touchdown weight bearing      Mobility Bed Mobility Overal bed mobility: Needs Assistance Bed Mobility: Supine to Sit     Supine to sit: Min guard     General bed mobility comments: pt able to use BUE to assist LLE to EOB    Transfers Overall transfer level: Needs assistance Equipment used: Rolling walker (2 wheels), None Transfers: Sit to/from Stand, Bed to chair/wheelchair/BSC Sit to Stand: Mod assist          Lateral/Scoot Transfers: Min assist General transfer comment: Trialed standing from bedside using RW though unsucessful and unable to lift bottom despite cues for hand placement alternatives. opted for scoot transfer to drop arm recliner with cues for sequencing and light assist to lift bottom. Pt able to successfully stand from recliner pushing from armrests with Mod  A though difficulty maintaining WB precautions initially, able to stand for 10 sec      Balance Overall balance assessment: Needs assistance Sitting-balance support: No upper extremity supported, Feet supported Sitting balance-Leahy Scale: Fair     Standing balance support: Bilateral upper extremity supported, During functional activity, Reliant on assistive device for balance Standing balance-Leahy Scale: Poor  ADL either performed or assessed with clinical judgement   ADL Overall ADL's : Needs assistance/impaired Eating/Feeding: Independent    Grooming: Set up;Sitting   Upper Body Bathing: Set up;Sitting   Lower Body Bathing: Moderate assistance;Sit to/from stand;Sitting/lateral leans   Upper Body Dressing : Set up;Sitting   Lower Body Dressing: Maximal assistance;Sit to/from stand       Toileting- Architect and Hygiene: Moderate assistance;Sitting/lateral lean;Sit to/from stand         General ADL Comments: Limited by post op pain, restlessness and L knee in extension     Vision Baseline Vision/History: 1 Wears glasses Ability to See in Adequate Light: 0 Adequate Patient Visual Report: No change from baseline Vision Assessment?: No apparent visual deficits     Perception     Praxis      Pertinent Vitals/Pain Pain Assessment Pain Assessment: Faces Faces Pain Scale: Hurts little more Pain Location: LLE Pain Descriptors / Indicators: Grimacing, Guarding Pain Intervention(s): Monitored during session, Premedicated before session, Repositioned     Hand Dominance Right   Extremity/Trunk Assessment Upper Extremity Assessment Upper Extremity Assessment: Overall WFL for tasks assessed   Lower Extremity Assessment Lower Extremity Assessment: Defer to PT evaluation   Cervical / Trunk Assessment Cervical / Trunk Assessment: Normal   Communication Communication Communication: No difficulties   Cognition Arousal/Alertness: Awake/alert Behavior During Therapy: Restless, Anxious, Impulsive Overall Cognitive Status: Impaired/Different from baseline Area of Impairment: Attention, Safety/judgement, Awareness, Problem solving                   Current Attention Level: Selective     Safety/Judgement: Decreased awareness of deficits, Decreased awareness of safety Awareness: Emergent Problem Solving: Difficulty sequencing, Requires verbal cues, Requires tactile cues General Comments: pleasant and eager to participate, very easily distracted and impulsive with movements at times. cues to  attend to tasks, implement safety precautions and sequence     General Comments       Exercises     Shoulder Instructions      Home Living Family/patient expects to be discharged to:: Private residence Living Arrangements: Alone Available Help at Discharge: Family;Available PRN/intermittently Type of Home: House Home Access: Stairs to enter [employer] of Steps: 3 Entrance Stairs-Rails: Right;Left Home Layout: One level     Bathroom Shower/Tub: Chief Strategy Officer: Standard     Home Equipment: Optician, dispensing (2 wheels) (still uses BSC as shower chair (says its rusted) but unsure if she still has the other equipment)   Additional Comments: family lives in [place], [place]      Prior Functioning/Environment Prior Level of Function : Independent/Modified Independent;Driving             Mobility Comments: typically no use of AD ADLs Comments: Able to complete ADLs, IADLs without issue        OT Problem List: Decreased strength;Decreased activity tolerance;Impaired balance (sitting and/or standing);Decreased cognition;Decreased safety awareness;Decreased knowledge of use of DME or AE;Decreased knowledge of precautions;Pain      OT Treatment/Interventions: Therapeutic exercise;Self-care/ADL training;Energy conservation;DME and/or AE instruction;Therapeutic activities;Patient/family education;Balance training    OT Goals(Current goals can be found in the care plan section) Acute Rehab OT Goals Patient Stated Goal: pain control, be able to improve mobility OT Goal Formulation: With patient Time For Goal Achievement: 12/31/21 Potential to Achieve Goals: Good  OT Frequency: Min 2X/week    Co-evaluation              AM-PAC OT "6 Clicks" Daily Activity  Outcome Measure Help from another person eating meals?: None Help from another person taking care of personal grooming?: A Little Help from another person  toileting, which includes using toliet, bedpan, or urinal?: A Lot Help from another person bathing (including washing, rinsing, drying)?: A Lot Help from another person to put on and taking off regular upper body clothing?: A Little Help from another person to put on and taking off regular lower body clothing?: A Lot 6 Click Score: 16   End of Session Equipment Utilized During Treatment: Gait belt;Rolling walker (2 wheels);Left knee immobilizer Nurse Communication: Mobility status  Activity Tolerance: Patient tolerated treatment well;Patient limited by pain Patient left: in chair;with call bell/phone within reach;Other (comment) (unable to locate chair alarm pads on floor - RN aware)  OT Visit Diagnosis: Other abnormalities of gait and mobility (R26.89);Unsteadiness on feet (R26.81)                Time: 4818-5631 OT Time Calculation (min): 33 min Charges:  OT General Charges $OT Visit: 1 Visit OT Evaluation $OT Eval Moderate Complexity: 1 Mod OT Treatments $Therapeutic Activity: 8-22 mins  Bradd Canary, OTR/L Acute Rehab Services Office: [phone]   Lorre Munroe 12/17/2021, 9:37 AM

## 2021-12-18 DIAGNOSIS — D72829 Elevated white blood cell count, unspecified: Secondary | ICD-10-CM | POA: Diagnosis not present

## 2021-12-18 DIAGNOSIS — S72492A Other fracture of lower end of left femur, initial encounter for closed fracture: Secondary | ICD-10-CM | POA: Diagnosis not present

## 2021-12-18 DIAGNOSIS — I10 Essential (primary) hypertension: Secondary | ICD-10-CM | POA: Diagnosis not present

## 2021-12-18 NOTE — Progress Notes (Signed)
Progress Note   Patient: Meghan Bowman FGH:[id number] DOB: 07/24/1960 DOA: 12/15/2021     3 DOS: the patient was seen and examined on 12/18/2021   Brief hospital course: Mrs. Tourangeau was admitted to the hospital with the working diagnosis of acute left periprosthetic distal femur fracture.   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 Patient tripped while ambulating, and landing on her left knee. Post fall she had severe pain at her left lower extremity, not able to move due to pain, not able to bear weight, prompting her to come to the hospital. On her initial physical examination her blood pressure was 105/74, HR 64, and 02 saturation 99%, lungs with no wheezing or rales, heart with S1 and S2 present and rhythmic, abdomen with no distention, no lower extremity edema.   Na 143, K 3,5 CL 110, bicarbonate 26 glucose 108 bun 7 cr 0,68  Mg 1,5  Wbc 11.9 hgb 12.1 plt 222  Left knee radiograph with acute oblique fracture through the distal femoral diaphysis extending to the level of the arthroplasty.   Patient has been placed on analgesics and orthopedics have been consulted.  11/22 open reduction internal fixation of distal femoral fracture (left).  11/23 plan to transfer patient to inpatient rehab.  11/24 continue medically stable for discharge to inpatient rehab.   Assessment and Plan: * Closed fracture of left distal femur (HCC) Acute oblique fracture through the distal femoral diaphysis extending to the level of the arthroplasty.   Sp open reduction and internal fixation with good toleration.   Plan to continue pain control with acetaminophen, and as needed oxycodone and hydromorphone. PRN methocarbamol.   Out of bed as tolerated per PT and OT.  Continue DVT and GI prophylaxis  Pending transfer to inpatient rehab.  Reactive leukocytosis, no  clinical signs of bacterial infection.    Essential hypertension Continue blood pressure control with metoprolol.  Systolic blood pressure has been 116 to 125 mmHg.   Hypomagnesemia Renal function has remained stable and her electrolytes have been corrected. Patient is tolerating po well.  Check renal function and electrolytes as needed.    Leukocytosis Reactive leukocytosis, cell count is 14,0  Mild anemia with Hgb at 9.7 possible chronic anemia.   HLD (hyperlipidemia) Continue statin therapy.   Mild intermittent asthma No signs of exacerbation   GERD (gastroesophageal reflux disease) Continue with pantoprazole po.   GAD (generalized anxiety disorder) Continue with sertraline         Subjective: Patient with no chest pain or dyspnea, she is tolerating po well,. Continue to have intermittent pain left lower extremity, improved with analgesics   Physical Exam: Vitals:   12/17/21 1257 12/17/21 2127 12/18/21 0450 12/18/21 0500  BP: 116/76 125/71 129/80   Pulse: (!) 57 61 65   Resp: 18 18 17    Temp: 97.8 F (36.6 C) 98.2 F (36.8 C) 97.8 F (36.6 C)   TempSrc: Oral Oral Oral   SpO2: 95% 97% 96%   Weight:    90 kg  Height:       Neurology awake and alert ENT with mild pallor Cardiovascular with S1 and S2 present and rhythmic Respiratory with no wheezing Abdomen with no distention Left lower extremity in orthopedic brace  Data Reviewed:    Family Communication: no family at the bedside   Disposition: Status is: Inpatient Remains inpatient  appropriate because: medically stable pending transfer to inpatient rehab   Planned Discharge Destination: Rehab     Author: Coralie Keens, MD 12/18/2021 1:05 PM  For on call review [email or web address].
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
## 2021-12-18 NOTE — Progress Notes (Signed)
PT Cancellation Note  Patient Details Name: Meghan Bowman MRN: [id number] DOB: 21-Feb-1960   Cancelled Treatment:    Reason Eval/Treat Not Completed: Fatigue/lethargy limiting ability to participate Pt reports mobilizing for hygiene already today and fatigued.  Pt also eating.  Will check back as schedule permits.   Janan Halter [place] 12/18/2021, 2:49 PM Thomasene Mohair PT, DPT Physical Therapist Acute Rehabilitation Services Preferred contact method: Secure Chat Weekend Pager Only: [phone] Office: [phone]

## 2021-12-18 NOTE — Progress Notes (Addendum)
     Subjective: Patient anxious this morning.  Worked with therapy yesterday was able to transfer to recliner with assistance.  Pain moderately well controlled.  Denies distal numbness and tingling.  Bledsoe brace ordered from [facility] yesterday hopefully to arrive and be applied this morning.  We will plan to allow for range of motion 0-30 in the brace to help with mobility and transfers. Objective:   VITALS:   Vitals:   12/17/21 1257 12/17/21 2127 12/18/21 0450 12/18/21 0500  BP: 116/76 125/71 129/80   Pulse: (!) 57 61 65   Resp: 18 18 17    Temp: 97.8 F (36.6 C) 98.2 F (36.8 C) 97.8 F (36.6 C)   TempSrc: Oral Oral Oral   SpO2: 95% 97% 96%   Weight:    90 kg  Height:        Sensation intact distally Intact pulses distally Dorsiflexion/Plantar flexion intact Incision: dressing C/D/I Compartment soft   Lab Results  Component Value Date   WBC 14.0 (H) 12/17/2021   HGB 9.7 (L) 12/17/2021   HCT 30.8 (L) 12/17/2021   MCV 95.1 12/17/2021   PLT 182 12/17/2021   BMET    Component Value Date/Time   NA 139 12/17/2021 0402   K 4.1 12/17/2021 0402   CL 109 12/17/2021 0402   CO2 24 12/17/2021 0402   GLUCOSE 182 (H) 12/17/2021 0402   BUN 9 12/17/2021 0402   CREATININE 0.71 12/17/2021 0402   CALCIUM 8.5 (L) 12/17/2021 0402   GFRNONAA >60 12/17/2021 0402      Xray: X-rays demonstrate interval fixation of her right side to distal femur fracture.  Well aligned in coronal plane.  Mild residual recurvatum on the lateral x-ray  Assessment/Plan: 2 Days Post-Op   Principal Problem:   Closed fracture of left distal femur (HCC) Active Problems:   Essential hypertension   Hypomagnesemia   Leukocytosis   GAD (generalized anxiety disorder)   Mild intermittent asthma   GERD (gastroesophageal reflux disease)   HLD (hyperlipidemia)   Status post ORIF left periprosthetic distal femur fracture 12/16/2021  Post op recs: WB: TTWB LLE x6 weeks, Bledsoe brace with range of  motion 0-30 Abx: ancef x23 hours post op Imaging: PACU xrays Dressing: keep intact until follow up, change PRN if soiled or saturated. DVT prophylaxis: Eliquis 2.5mg  BID POD1 x4 weeks Follow up: 2 weeks after surgery for a wound check with Dr. 05-01-1970 at [facility].  Address: [address], [place], [place]  Office Phone: [phone]   [phone] 12/18/2021, 7:23 AM   12/20/2021, MD  Contact information:   [phone] 7am-5pm epic message Dr. QIONGEXB, or call office for patient follow up: [phone] After hours and holidays please check [email or web address] for group call information for [facility]

## 2021-12-18 NOTE — Progress Notes (Signed)
Inpatient Rehab Coordinator Note:  I spoke with patient on phone to discuss CIR recommendations and goals/expectations of CIR stay.  We reviewed 3 hrs/day of therapy, physician follow up, and average length of stay 2 weeks (dependent upon progress) with goals of mod I.  Patient would like to pursue AIR placement, and will have assistance from daughter at discharge. [employer] for prior authorization. Will continue to follow.   Rehab Admissons Coordinator [place], [place], [place] [phone]

## 2021-12-19 DIAGNOSIS — S728X9A Other fracture of unspecified femur, initial encounter for closed fracture: Secondary | ICD-10-CM

## 2021-12-19 MED ORDER — ORAL CARE MOUTH RINSE: 15.0000 mL | OROMUCOSAL | Status: DC | PRN

## 2021-12-19 MED ORDER — FLUTICASONE PROPIONATE 50 MCG/ACT NA SUSP
2.0000 | Freq: Every day | NASAL | Status: DC
  Administered 2021-12-19 – 2021-12-24 (×6): 2 via NASAL
  Filled 2021-12-19: qty 16

## 2021-12-19 NOTE — Plan of Care (Signed)
  Problem: Education: Goal: Knowledge of General Education information will improve Description: Including pain rating scale, medication(s)/side effects and non-pharmacologic comfort measures Outcome: Progressing   Problem: Health Behavior/Discharge Planning: Goal: Ability to manage health-related needs will improve Outcome: Progressing   Problem: Nutrition: Goal: Adequate nutrition will be maintained Outcome: Adequate for Discharge   Problem: Elimination: Goal: Will not experience complications related to urinary retention Outcome: Adequate for Discharge   Problem: Safety: Goal: Ability to remain free from injury will improve Outcome: Progressing

## 2021-12-19 NOTE — Plan of Care (Signed)

## 2021-12-19 NOTE — Progress Notes (Signed)
Physical Therapy Treatment Patient Details Name: Meghan Bowman MRN: [id number] DOB: 1960/03/20 Today's Date: 12/19/2021   History of Present Illness Pt is a 61 y/o female admitted after a fall at home resulting in periprosthetic fracture of the distal left femur on 12/15/21. Pt underwent ORIF of L LE on 11/22. PMH: HTN, HLD, asthma, L TKA.    PT Comments    Pt making gradual progress. She does well with maintaining TDWB but with attempts at ambulation she fatigues easily and does need mod A for balance at times.  Pt motivated and tolerated therapy well - excellent rehab potential.    Recommendations for follow up therapy are one component of a multi-disciplinary discharge planning process, led by the attending physician.  Recommendations may be updated based on patient status, additional functional criteria and insurance authorization.  Follow Up Recommendations  Acute inpatient rehab (3hours/day)     Assistance Recommended at Discharge Frequent or constant Supervision/Assistance  Patient can return home with the following Assistance with feeding;Assist for transportation;Help with stairs or ramp for entrance;A lot of help with bathing/dressing/bathroom;A lot of help with walking and/or transfers   Equipment Recommendations  None recommended by PT    Recommendations for Other Services Rehab consult     Precautions / Restrictions Precautions Precautions: Fall Other Brace: Bledsoe Brace 0 to 30 degrees at all tmes Restrictions Weight Bearing Restrictions: Yes LLE Weight Bearing: Touchdown weight bearing     Mobility  Bed Mobility Overal bed mobility: Needs Assistance Bed Mobility: Supine to Sit     Supine to sit: Min guard     General bed mobility comments: pt able to use BUE to assist LLE to EOB    Transfers Overall transfer level: Needs assistance Equipment used: Rolling walker (2 wheels) Transfers: Sit to/from Stand, Bed to chair/wheelchair/BSC Sit to Stand:  Mod assist, +2 safety/equipment Stand pivot transfers: Min assist, +2 safety/equipment         General transfer comment: Sit to stand from bed and BSC with cues for TDWB and L LE management/positioning with assist to rise.  Pt pivoted from [facility] to recliner with RW and min A of 2 and cues.    Ambulation/Gait Ambulation/Gait assistance: Mod assist, +2 safety/equipment Gait Distance (Feet): 5 Feet Assistive device: Rolling walker (2 wheels) Gait Pattern/deviations: Step-to pattern       General Gait Details: Pt with hop to pattern on R LE with NWB/TDWB on L LE.  She tended to get to close to front of RW and rquired mod A for balance when she did so.  She was able to partially correct RW proximity with cues but still needing min A of 2 for balance /safety.  Fatigued after 4-5 ' needing chair.   Stairs             Wheelchair Mobility    Modified Rankin (Stroke Patients Only)       Balance Overall balance assessment: Needs assistance Sitting-balance support: No upper extremity supported, Feet supported Sitting balance-Leahy Scale: Good     Standing balance support: Bilateral upper extremity supported Standing balance-Leahy Scale: Poor Standing balance comment: Requiring RW and min guard static but up to mod A with steping; does well NWB/TDWB on L LE                            Cognition Arousal/Alertness: Awake/alert Behavior During Therapy: Anxious Overall Cognitive Status: Impaired/Different from baseline  Problem Solving: Requires verbal cues, Requires tactile cues          Exercises General Exercises - Lower Extremity Ankle Circles/Pumps: AROM, Both, 10 reps, Supine Quad Sets: AROM, Both, 10 reps, Supine Heel Slides: AAROM, Left, 10 reps, Seated (within 30 degrees with Bledsoe brace)    General Comments General comments (skin integrity, edema, etc.): Pt required assist with toileting ADLs       Pertinent Vitals/Pain Pain Assessment Pain Assessment: 0-10 Pain Score: 7  Pain Location: LLE Pain Descriptors / Indicators: Grimacing, Guarding Pain Intervention(s): Monitored during session, Premedicated before session, Limited activity within patient's tolerance, Repositioned    Home Living                          Prior Function            PT Goals (current goals can now be found in the care plan section) Progress towards PT goals: Progressing toward goals    Frequency    Min 4X/week      PT Plan Current plan remains appropriate    Co-evaluation              AM-PAC PT "6 Clicks" Mobility   Outcome Measure  Help needed turning from your back to your side while in a flat bed without using bedrails?: A Little Help needed moving from lying on your back to sitting on the side of a flat bed without using bedrails?: A Little Help needed moving to and from a bed to a chair (including a wheelchair)?: A Lot Help needed standing up from a chair using your arms (e.g., wheelchair or bedside chair)?: A Lot Help needed to walk in hospital room?: Total Help needed climbing 3-5 steps with a railing? : Total 6 Click Score: 12    End of Session Equipment Utilized During Treatment: Gait belt Activity Tolerance: Patient tolerated treatment well Patient left: in chair;with call bell/phone within reach (no alarm pads available - pt reports she will not get up on her own) Nurse Communication: Mobility status PT Visit Diagnosis: Other abnormalities of gait and mobility (R26.89);Pain;Difficulty in walking, not elsewhere classified (R26.2) Pain - part of body: Leg     Time: 0539-7673 PT Time Calculation (min) (ACUTE ONLY): 19 min  Charges:  $Gait Training: 8-22 mins                     Anise Salvo, PT Acute Rehab [facility] [phone]    Rayetta Humphrey 12/19/2021, 12:55 PM

## 2021-12-19 NOTE — Progress Notes (Signed)
     Subjective: Patient is doing well this morning.  Pain is getting better controlled.  Received Bledsoe brace yesterday which is fitting well.  I did a dressing change in her room this morning.  She is moving the leg fairly well independently.  Denies distal numbness and tingling.  No new issues.   Objective:   VITALS:   Vitals:   12/18/21 1308 12/18/21 2101 12/19/21 0500 12/19/21 0508  BP: 135/83 125/64  102/75  Pulse: 60 64  63  Resp: 16 20  16   Temp: 97.7 F (36.5 C) 97.8 F (36.6 C)  98.1 F (36.7 C)  TempSrc: Oral Oral  Oral  SpO2: 98% 99%  97%  Weight:   90.1 kg   Height:        Sensation intact distally Intact pulses distally Dorsiflexion/Plantar flexion intact Incision: Incisions are healing well.  Sutures intact no erythema or drainage.  New dressing applied.  Minimal expected postoperative swelling. Compartment soft   Lab Results  Component Value Date   WBC 14.0 (H) 12/17/2021   HGB 9.7 (L) 12/17/2021   HCT 30.8 (L) 12/17/2021   MCV 95.1 12/17/2021   PLT 182 12/17/2021   BMET    Component Value Date/Time   NA 139 12/17/2021 0402   K 4.1 12/17/2021 0402   CL 109 12/17/2021 0402   CO2 24 12/17/2021 0402   GLUCOSE 182 (H) 12/17/2021 0402   BUN 9 12/17/2021 0402   CREATININE 0.71 12/17/2021 0402   CALCIUM 8.5 (L) 12/17/2021 0402   GFRNONAA >60 12/17/2021 0402      Xray: X-rays demonstrate interval fixation of her right side to distal femur fracture.  Well aligned in coronal plane.  Mild residual recurvatum on the lateral x-ray  Assessment/Plan: 3 Days Post-Op   Principal Problem:   Closed fracture of left distal femur (HCC) Active Problems:   Essential hypertension   Hypomagnesemia   Leukocytosis   GAD (generalized anxiety disorder)   Mild intermittent asthma   GERD (gastroesophageal reflux disease)   HLD (hyperlipidemia)   Status post ORIF left periprosthetic distal femur fracture 12/16/2021  Post op recs: WB: TTWB LLE x6 weeks,  Bledsoe brace with range of motion 0-30 Abx: ancef x23 hours post op Imaging: PACU xrays Dressing: keep intact until follow up, change PRN if soiled or saturated. DVT prophylaxis: Eliquis 2.5mg  BID POD1 x4 weeks Follow up: 2 weeks after surgery for a wound check with Dr. 05-01-1970 at [facility].  Address: [address], [place], [place]  Office Phone: [phone]   [phone] 12/19/2021, 10:49 AM   12/21/2021, MD  Contact information:   [phone] 7am-5pm epic message Dr. [email or web address], or call office for patient follow up: [phone] After hours and holidays please check [email or web address] for group call information for [facility]

## 2021-12-19 NOTE — Progress Notes (Signed)
  Progress Note   Patient: Meghan Bowman:[id number] DOB: 1960-06-13 DOA: 12/15/2021     4 DOS: the patient was seen and examined on 12/19/2021   Brief hospital course: Meghan Bowman was admitted to the hospital with the working diagnosis of acute left periprosthetic distal femur fracture.   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 Patient tripped while ambulating, and landing on her left knee. Post fall she had severe pain at her left lower extremity, not able to move due to pain, not able to bear weight, prompting her to come to the hospital. Left knee radiograph with acute oblique fracture through the distal femoral diaphysis extending to the level of the arthroplasty.  11/22 open reduction internal fixation of distal femoral fracture (left).  11/23 plan to transfer patient to inpatient rehab when bed available  Assessment and Plan: Closed fracture of left distal femur (HCC) Acute oblique fracture through the distal femoral diaphysis extending to the level of the arthroplasty.  S/p open reduction and internal fixation with good toleration.  Plan to continue pain control with acetaminophen, and as needed oxycodone and hydromorphone. PRN methocarbamol.   Out of bed as tolerated per PT and OT.  Pending transfer to inpatient rehab.  Reactive leukocytosis, no clinical signs of bacterial infection.    Essential hypertension Continue blood pressure control with metoprolol.   Hypomagnesemia Renal function has remained stable and her electrolytes have been corrected. -replete   Leukocytosis Reactive leukocytosis, cell count is 14,0  Mild anemia with Hgb at 9.7 possible chronic anemia.   HLD (hyperlipidemia) Continue statin therapy.   Mild intermittent asthma No signs of exacerbation   GERD (gastroesophageal reflux disease) Continue with  pantoprazole po.   GAD (generalized anxiety disorder) Continue with sertraline   Obesity Estimated body mass index is 33.05 kg/m as calculated from the following:   Height as of this encounter: 5\' 5"  (1.651 m).   Weight as of this encounter: 90.1 kg.    Subjective: some nasal congestion  Physical Exam: Vitals:   12/18/21 1308 12/18/21 2101 12/19/21 0500 12/19/21 0508  BP: 135/83 125/64  102/75  Pulse: 60 64  63  Resp: 16 20  16   Temp: 97.7 F (36.5 C) 97.8 F (36.6 C)  98.1 F (36.7 C)  TempSrc: Oral Oral  Oral  SpO2: 98% 99%  97%  Weight:   90.1 kg   Height:        General: Appearance:    Obese female in no acute distress     Lungs:     respirations unlabored  Heart:    Normal heart rate.   MS:   All extremities are intact.   Neurologic:   Awake, alert, oriented x 3.       Family Communication: no family at the bedside   Disposition: Status is: Inpatient Remains inpatient appropriate because: medically stable pending transfer to inpatient rehab   Planned Discharge Destination: Rehab     Author: 12/21/21, DO 12/19/2021 10:42 AM  For on call review [email or web address]

## 2021-12-20 DIAGNOSIS — S728X9A Other fracture of unspecified femur, initial encounter for closed fracture: Secondary | ICD-10-CM | POA: Diagnosis not present

## 2021-12-20 MED ORDER — CARBAMIDE PEROXIDE 6.5 % OT SOLN: 5.0000 [drp] | Freq: Two times a day (BID) | OTIC | Status: DC

## 2021-12-20 MED ORDER — CARBAMIDE PEROXIDE 6.5 % OT SOLN
5.0000 [drp] | Freq: Two times a day (BID) | OTIC | Status: AC
  Administered 2021-12-20 – 2021-12-21 (×4): 5 [drp] via OTIC
  Filled 2021-12-20: qty 15

## 2021-12-20 NOTE — Progress Notes (Signed)
  Progress Note   Patient: Meghan Bowman WIO:[id number] DOB: 06-Jan-1961 DOA: 12/15/2021     5 DOS: the patient was seen and examined on 12/20/2021   Brief hospital course: Mrs. Meghan Bowman was admitted to the hospital with the working diagnosis of acute left periprosthetic distal femur fracture.   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 Patient tripped while ambulating, and landing on her left knee. Post fall she had severe pain at her left lower extremity, not able to move due to pain, not able to bear weight, prompting her to come to the hospital. Left knee radiograph with acute oblique fracture through the distal femoral diaphysis extending to the level of the arthroplasty.  11/22 open reduction internal fixation of distal femoral fracture (left).  11/23 plan to transfer patient to inpatient rehab when bed available  Assessment and Plan: Closed fracture of left distal femur (HCC) Acute oblique fracture through the distal femoral diaphysis extending to the level of the arthroplasty.  S/p open reduction and internal fixation with good toleration.  Plan to continue pain control with acetaminophen, and as needed oxycodone and hydromorphone. PRN methocarbamol.   Out of bed as tolerated per PT and OT.  Pending transfer to inpatient rehab.  Reactive leukocytosis, no clinical signs of bacterial infection.    Essential hypertension Continue blood pressure control with metoprolol.   Hypomagnesemia Renal function has remained stable and her electrolytes have been corrected. -repleted   Leukocytosis Reactive leukocytosis, cell count is 14,0  Mild anemia - Hgb at 9.7 possible chronic anemia.   HLD (hyperlipidemia) Continue statin therapy.   Mild intermittent asthma No signs of exacerbation   GERD (gastroesophageal reflux disease) Continue with  pantoprazole po.   GAD (generalized anxiety disorder) Continue with sertraline   Obesity Estimated body mass index is 33.05 kg/m as calculated from the following:   Height as of this encounter: 5\' 5"  (1.651 m).   Weight as of this encounter: 90.1 kg.    Subjective: nasal congestion better  Physical Exam: Vitals:   12/19/21 0508 12/19/21 1332 12/19/21 2048 12/20/21 0500  BP: 102/75 115/67 (!) 131/91 (!) 138/92  Pulse: 63 63 80 80  Resp: 16 18 18 18   Temp: 98.1 F (36.7 C) 98 F (36.7 C) 98 F (36.7 C) 98 F (36.7 C)  TempSrc: Oral Oral Oral Oral  SpO2: 97% 96% 98% 98%  Weight:      Height:         General: Appearance:    Obese female in no acute distress     Lungs:     respirations unlabored  Heart:    Normal heart rate. Normal rhythm. No murmurs, rubs, or gallops.   MS:   All extremities are intact.   Neurologic:   Awake, alert         Family Communication: no family at the bedside   Disposition: Status is: Inpatient Remains inpatient appropriate because: medically stable pending transfer to inpatient rehab   Planned Discharge Destination: Rehab     Author: 12/22/21, DO 12/20/2021 11:34 AM  For on call review [email or web address]

## 2021-12-20 NOTE — Progress Notes (Signed)
Night CN  from 4th floor gave telephone report to receiving nurse from 3rd floor. Receiving nurse had no questions. Patient is alert and oriented. Tolerated transfer to 1301 with no difficulties .

## 2021-12-20 NOTE — Progress Notes (Signed)
Occupational Therapy Treatment Patient Details Name: Meghan Bowman MRN: [id number] DOB: 1960-03-03 Today's Date: 12/20/2021   History of present illness Pt is a 61 y/o female admitted after a fall at home resulting in periprosthetic fracture of the distal left femur on 12/15/21. Pt underwent ORIF of L LE on 11/22. PMH: HTN, HLD, asthma, L TKA.   OT comments  Patient is motivated to participate in session and is able to maintain NWB on LLE with transfers and hopping in room to recliner. Patient needed increased physical assist mod A for turning and hopping with patient noted to progress too close to front of walker and needed multimodal cues to maintain distance from front of RW. Patient was educated on brace management and LB dressing/bathing tasks compensatory strategies in recliner.  Patient was able to maintain WB status during session with increased physical A. Patient would continue to benefit from skilled OT services at this time while admitted and after d/c to address noted deficits in order to improve overall safety and independence in ADLs.     Recommendations for follow up therapy are one component of a multi-disciplinary discharge planning process, led by the attending physician.  Recommendations may be updated based on patient status, additional functional criteria and insurance authorization.    Follow Up Recommendations  Acute inpatient rehab (3hours/day)     Assistance Recommended at Discharge Frequent or constant Supervision/Assistance  Patient can return home with the following  A lot of help with walking and/or transfers;A lot of help with bathing/dressing/bathroom;Assistance with cooking/housework;Assist for transportation;Help with stairs or ramp for entrance   Equipment Recommendations  BSC/3in1;Other (comment) (drop arm BSC)    Recommendations for Other Services Rehab consult    Precautions / Restrictions Precautions Precautions: Fall Required Braces or Orthoses:  Other Brace Other Brace: Bledsoe Brace 0 to 30 degrees at all tmes Restrictions Weight Bearing Restrictions: Yes LLE Weight Bearing: Touchdown weight bearing       Mobility Bed Mobility Overal bed mobility: Needs Assistance Bed Mobility: Supine to Sit     Supine to sit: Min guard     General bed mobility comments: pt able to use BUE to assist LLE to EOB    Transfers                         Balance Overall balance assessment: Needs assistance Sitting-balance support: No upper extremity supported, Feet supported Sitting balance-Leahy Scale: Good     Standing balance support: Bilateral upper extremity supported Standing balance-Leahy Scale: Poor                             ADL either performed or assessed with clinical judgement   ADL Overall ADL's : Needs assistance/impaired                       Lower Body Dressing Details (indicate cue type and reason): patient was educated on monitoring skin for breakdown or redness with both braces. patietn noted to have KI in place at this time. patient was able to get sock on and off on RLE with figure four positioning. patient was able to touch toe and bottom of brace but limited ability to engage in ADL simulated tasks on LLE>   Toilet Transfer Details (indicate cue type and reason): patient was mod A to transfer from edge of bed to recliner in room with patient using NWB with RW  to hop and complete few steps in room and then transition into recliner. patietn needed increased education on proper placement of RW for hopping to maintiain standing balance with noted mod A needed when patient hopped to close to front of walker.                Extremity/Trunk Assessment              Vision       Perception     Praxis      Cognition Arousal/Alertness: Awake/alert Behavior During Therapy: WFL for tasks assessed/performed Overall Cognitive Status: Impaired/Different from baseline                                  General Comments: pleasant and eager to participate, very easily distracted and impulsive with movements at times. cues to attend to tasks, implement safety precautions and sequence        Exercises      Shoulder Instructions       General Comments      Pertinent Vitals/ Pain       Pain Assessment Pain Score: 7  Pain Location: LLE Pain Descriptors / Indicators: Grimacing, Guarding Pain Intervention(s): Limited activity within patient's tolerance, Monitored during session, Repositioned, Patient requesting pain meds-RN notified, RN gave pain meds during session  Home Living                                          Prior Functioning/Environment              Frequency  Min 2X/week        Progress Toward Goals  OT Goals(current goals can now be found in the care plan section)     Acute Rehab OT Goals Patient Stated Goal: to get better and get back home  Plan      Co-evaluation                 AM-PAC OT "6 Clicks" Daily Activity     Outcome Measure   Help from another person eating meals?: None Help from another person taking care of personal grooming?: A Little Help from another person toileting, which includes using toliet, bedpan, or urinal?: A Lot Help from another person bathing (including washing, rinsing, drying)?: A Lot Help from another person to put on and taking off regular upper body clothing?: A Little Help from another person to put on and taking off regular lower body clothing?: A Lot 6 Click Score: 16    End of Session Equipment Utilized During Treatment: Gait belt;Rolling walker (2 wheels);Left knee immobilizer  OT Visit Diagnosis: Other abnormalities of gait and mobility (R26.89);Unsteadiness on feet (R26.81)   Activity Tolerance Patient tolerated treatment well;Patient limited by pain   Patient Left in chair;with call bell/phone within reach   Nurse Communication Mobility  status        Time: 9470-7615 OT Time Calculation (min): 25 min  Charges: OT General Charges $OT Visit: 1 Visit OT Treatments $Self Care/Home Management : 23-37 mins  Rosalio Loud, MS Acute Rehabilitation Department Office# [phone]   Selinda Flavin 12/20/2021, 3:42 PM

## 2021-12-21 ENCOUNTER — Encounter (HOSPITAL_COMMUNITY): Payer: Self-pay | Admitting: Orthopedic Surgery

## 2021-12-21 DIAGNOSIS — S72492A Other fracture of lower end of left femur, initial encounter for closed fracture: Secondary | ICD-10-CM | POA: Diagnosis not present

## 2021-12-21 MED ORDER — OXYCODONE HCL 5 MG PO TABS
10.0000 mg | ORAL_TABLET | ORAL | Status: DC | PRN
  Administered 2021-12-21 – 2021-12-23 (×8): 10 mg via ORAL
  Filled 2021-12-21 (×9): qty 2

## 2021-12-21 MED ORDER — HYDROMORPHONE HCL 1 MG/ML IJ SOLN
1.0000 mg | INTRAMUSCULAR | Status: DC | PRN
  Administered 2021-12-21 – 2021-12-23 (×10): 1 mg via INTRAVENOUS
  Filled 2021-12-21 (×10): qty 1

## 2021-12-21 MED ORDER — APIXABAN 2.5 MG PO TABS
2.5000 mg | ORAL_TABLET | Freq: Two times a day (BID) | ORAL | Status: DC
  Administered 2021-12-21 – 2021-12-24 (×7): 2.5 mg via ORAL
  Filled 2021-12-21 (×7): qty 1

## 2021-12-21 NOTE — Progress Notes (Signed)
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 Patient tripped while ambulating, and landing on her left knee. Post fall she had severe pain at her left lower extremity, not able to move due to pain, not able to bear weight, prompting her to come to the hospital. Left knee radiograph with acute oblique fracture through the distal femoral diaphysis extending to the level of the arthroplasty.    Assessment & Plan:  Principal Problem:   Closed fracture of left distal femur (HCC) Active Problems:   Essential hypertension   Hypomagnesemia   Leukocytosis   HLD (hyperlipidemia)   Mild intermittent asthma   GERD (gastroesophageal reflux disease)   GAD (generalized anxiety disorder)     Closed fracture of left distal femur (HCC) status post ORIF 12/16/2021 Postop management by orthopedic including weightbearing precaution, pain medications, DVT prophylaxis with Eliquis 2.5 mg twice daily for 4 weeks, wound care and outpatient follow-up.   Essential hypertension Toprol-XL.  IV as needed ordered   Hypomagnesemia As needed repletion   Leukocytosis Suspect reactive   Mild anemia, hemoglobin 9.7 Chronic and slight.  Blood loss anemia.  No obvious signs of blood loss.   HLD (hyperlipidemia) Continue Lipitor   Mild intermittent asthma As needed bronchodilators   GERD (gastroesophageal reflux disease) Daily Protonix   GAD (generalized anxiety disorder) On Zoloft   Obesity Estimated body mass index is 33.05 kg/m as calculated from the following:   Height as of this encounter: 5\' 5"  (1.651 m).   Weight as of this encounter: 90.1 kg.    Family Communication: no family at the bedside    Disposition: Status is:  Inpatient Remains inpatient appropriate because: medically stable pending transfer to inpatient rehab   Planned Discharge Destination: Rehab DVT prophylaxis-Eliquis 2.5 mg twice daily   Subjective: Seen and examined at bedside.  Reporting of left lower extremity pain as expected postoperatively.  Would like me  Adjust her pain medication so she can participate better with physical therapy   Examination:  General exam: Appears calm and comfortable  Respiratory system: Clear to auscultation. Respiratory effort normal. Cardiovascular system: S1 & S2 heard, RRR. No JVD, murmurs, rubs, gallops or clicks. No pedal edema. Gastrointestinal system: Abdomen is nondistended, soft and nontender. No organomegaly or masses felt. Normal bowel sounds heard. Central nervous system: Alert and oriented. No focal neurological deficits. Extremities: Symmetric 5 x 5 power. Skin: No rashes, lesions or ulcers Psychiatry: Judgement and insight appear normal. Mood & affect appropriate.   Left lower extremity dressing is in place  Objective: Vitals:   12/20/21 2047 12/20/21 2231 12/21/21 0132 12/21/21 0649  BP: 123/84 108/73 (!) 144/79 139/69  Pulse: 80 82 73 77  Resp: 20 18 18 18   Temp: 98.9 F (37.2 C) 98.3 F (36.8 C) 97.9 F (36.6 C) 97.8 F (36.6 C)  TempSrc: Oral Oral Oral Oral  SpO2: 96% 98% 97% 93%  Weight:      Height:        Intake/Output Summary (Last 24 hours) at 12/21/2021 0743 Last data filed at 12/21/2021 0656 Gross per 24 hour  Intake 1170 ml  Output 400 ml  Net 770 ml   Filed Weights   12/16/21 0233 12/18/21 0500 12/19/21 0500  Weight:  90.4 kg 90 kg 90.1 kg     Data Reviewed:   CBC: Recent Labs  Lab 12/15/21 1725 12/16/21 0403 12/17/21 0402  WBC 11.9* 9.8 14.0*  NEUTROABS 7.9* 4.6  --   HGB 12.1 10.4* 9.7*  HCT 36.6 32.2* 30.8*  MCV 89.9 92.5 95.1  PLT 222 197 182   Basic Metabolic Panel: Recent Labs  Lab 12/15/21 1725 12/15/21 2244 12/16/21 0403  12/17/21 0402  NA 143  --  142 139  K 3.5  --  3.5 4.1  CL 110  --  107 109  CO2 26  --  29 24  GLUCOSE 108*  --  106* 182*  BUN 7*  --  10 9  CREATININE 0.68  --  0.71 0.71  CALCIUM 8.9  --  8.5* 8.5*  MG  --  1.5* 1.7 2.1   GFR: Estimated Creatinine Clearance: 80.8 mL/min (by C-G formula based on SCr of 0.71 mg/dL). Liver Function Tests: Recent Labs  Lab 12/15/21 1725 12/16/21 0403  AST 21 17  ALT 17 15  ALKPHOS 71 65  BILITOT 0.9 0.9  PROT 6.7 5.8*  ALBUMIN 4.0 3.4*   No results for input(s): "LIPASE", "AMYLASE" in the last 168 hours. No results for input(s): "AMMONIA" in the last 168 hours. Coagulation Profile: Recent Labs  Lab 12/16/21 0403  INR 1.1   Cardiac Enzymes: No results for input(s): "CKTOTAL", "CKMB", "CKMBINDEX", "TROPONINI" in the last 168 hours. BNP (last 3 results) No results for input(s): "PROBNP" in the last 8760 hours. HbA1C: No results for input(s): "HGBA1C" in the last 72 hours. CBG: No results for input(s): "GLUCAP" in the last 168 hours. Lipid Profile: No results for input(s): "CHOL", "HDL", "LDLCALC", "TRIG", "CHOLHDL", "LDLDIRECT" in the last 72 hours. Thyroid Function Tests: No results for input(s): "TSH", "T4TOTAL", "FREET4", "T3FREE", "THYROIDAB" in the last 72 hours. Anemia Panel: No results for input(s): "VITAMINB12", "FOLATE", "FERRITIN", "TIBC", "IRON", "RETICCTPCT" in the last 72 hours. Sepsis Labs: No results for input(s): "PROCALCITON", "LATICACIDVEN" in the last 168 hours.  Recent Results (from the past 240 hour(s))  Surgical PCR screen     Status: None   Collection Time: 12/16/21  4:00 AM   Specimen: Nasal Mucosa; Nasal Swab  Result Value Ref Range Status   MRSA, PCR NEGATIVE NEGATIVE Final   Staphylococcus aureus NEGATIVE NEGATIVE Final    Comment: (NOTE) The Xpert SA Assay (FDA approved for NASAL specimens in patients 32 years of age and older), is one component of a comprehensive surveillance program. It is not  intended to diagnose infection nor to guide or monitor treatment. Performed at [facility], [address] [place], [place] [address]          Radiology Studies: No results found.      Scheduled Meds:  atorvastatin  40 mg Oral Daily   carbamide peroxide  5 drop Left EAR BID   fluticasone  2 spray Each Nare Daily   metoprolol succinate  25 mg Oral Daily   pantoprazole  40 mg Oral Daily   polyethylene glycol  17 g Oral Daily   sertraline  100 mg Oral QHS   Continuous Infusions:   LOS: 6 days   Time spent= 35 mins    Katharina Jehle Joline Maxcy, MD [facility]  If 7PM-7AM, please contact night-coverage  12/21/2021, 7:43 AM

## 2021-12-21 NOTE — Progress Notes (Signed)
Inpatient Rehab Admissions Coordinator:   Insurance requesting updated clinicals which were faxed this am. Will continue to follow for potential CIR admission pending insurance approval/bed availability.   Rehab Admissons Coordinator [place], [place], [place] [phone]

## 2021-12-21 NOTE — Progress Notes (Signed)
Physical Therapy Treatment Patient Details Name: Meghan Bowman MRN: [id number] DOB: [birth year] Today's Date: 12/21/2021   History of Present Illness Pt is a 60 y/o female admitted after a fall at home resulting in periprosthetic fracture of the distal left femur on 12/15/21. Pt underwent ORIF of L LE on 11/22. PMH: HTN, HLD, asthma, L TKA.    PT Comments    Pt tolerated increased activity level today, she ambulated 39' with a RW, no loss of balance, distance limited by LLE pain and generalized fatigue. VCs for TDWB status. Assisted pt to don L bledsoe brace at start of session.    Recommendations for follow up therapy are one component of a multi-disciplinary discharge planning process, led by the attending physician.  Recommendations may be updated based on patient status, additional functional criteria and insurance authorization.  Follow Up Recommendations  Acute inpatient rehab (3hours/day)     Assistance Recommended at Discharge Frequent or constant Supervision/Assistance  Patient can return home with the following Assistance with feeding;Assist for transportation;Help with stairs or ramp for entrance;A lot of help with bathing/dressing/bathroom;A lot of help with walking and/or transfers   Equipment Recommendations  None recommended by PT    Recommendations for Other Services Rehab consult     Precautions / Restrictions Precautions Precautions: Fall Other Brace: Bledsoe Brace 0 to 30 degrees at all tmes Restrictions Weight Bearing Restrictions: Yes LLE Weight Bearing: Touchdown weight bearing     Mobility  Bed Mobility Overal bed mobility: Needs Assistance Bed Mobility: Supine to Sit     Supine to sit: Supervision, HOB elevated     General bed mobility comments: pt able to use BUE to assist LLE to EOB    Transfers Overall transfer level: Needs assistance Equipment used: Rolling walker (2 wheels) Transfers: Sit to/from Stand Sit to Stand: Min guard, From  elevated surface           General transfer comment: VCs for hand placement to stand from elevated bed and from bedside commode    Ambulation/Gait Ambulation/Gait assistance: Min guard Gait Distance (Feet): 75 Feet Assistive device: Rolling walker (2 wheels) Gait Pattern/deviations: Step-to pattern Gait velocity: decr     General Gait Details: VCs for TDWB status, distance limited by pain/fatigue, Bledsoe brace on LLE, no loss of balance   Stairs             Wheelchair Mobility    Modified Rankin (Stroke Patients Only)       Balance Overall balance assessment: Needs assistance Sitting-balance support: No upper extremity supported, Feet supported Sitting balance-Leahy Scale: Good     Standing balance support: Bilateral upper extremity supported Standing balance-Leahy Scale: Poor                              Cognition Arousal/Alertness: Awake/alert Behavior During Therapy: WFL for tasks assessed/performed Overall Cognitive Status: Within Functional Limits for tasks assessed                                          Exercises      General Comments        Pertinent Vitals/Pain Pain Assessment Pain Score: 7  Pain Location: LLE Pain Descriptors / Indicators: Grimacing, Guarding Pain Intervention(s): Limited activity within patient's tolerance, Monitored during session, Premedicated before session, Patient requesting pain meds-RN notified, Repositioned  Home Living                          Prior Function            PT Goals (current goals can now be found in the care plan section) Acute Rehab PT Goals Patient Stated Goal: Get back to my PLOF/ Independent PT Goal Formulation: With patient Time For Goal Achievement: 12/31/21 Potential to Achieve Goals: Good Progress towards PT goals: Progressing toward goals    Frequency    Min 4X/week      PT Plan Current plan remains appropriate     Co-evaluation              AM-PAC PT "6 Clicks" Mobility   Outcome Measure  Help needed turning from your back to your side while in a flat bed without using bedrails?: A Little Help needed moving from lying on your back to sitting on the side of a flat bed without using bedrails?: A Little Help needed moving to and from a bed to a chair (including a wheelchair)?: A Little Help needed standing up from a chair using your arms (e.g., wheelchair or bedside chair)?: A Little Help needed to walk in hospital room?: A Little Help needed climbing 3-5 steps with a railing? : A Lot 6 Click Score: 17    End of Session Equipment Utilized During Treatment: Gait belt Activity Tolerance: Patient tolerated treatment well Patient left: in chair;with call bell/phone within reach;with chair alarm set Nurse Communication: Mobility status PT Visit Diagnosis: Other abnormalities of gait and mobility (R26.89);Pain;Difficulty in walking, not elsewhere classified (R26.2) Pain - Right/Left: Left Pain - part of body: Leg     Time: 3614-4315 PT Time Calculation (min) (ACUTE ONLY): 29 min  Charges:  $Gait Training: 8-22 mins $Therapeutic Activity: 8-22 mins                     Ralene Bathe Kistler PT 12/21/2021  Acute Rehabilitation Services  Office [phone]

## 2021-12-21 NOTE — Progress Notes (Signed)
     Subjective: Patient is doing well this morning.  Pain well controlled. Has been working well with PT but gets easily fatigued. Denies distal n/t. Hoping to go to inpatinet rehab.   Objective:   VITALS:   Vitals:   12/20/21 2047 12/20/21 2231 12/21/21 0132 12/21/21 0649  BP: 123/84 108/73 (!) 144/79 139/69  Pulse: 80 82 73 77  Resp: 20 18 18 18   Temp: 98.9 F (37.2 C) 98.3 F (36.8 C) 97.9 F (36.6 C) 97.8 F (36.6 C)  TempSrc: Oral Oral Oral Oral  SpO2: 96% 98% 97% 93%  Weight:      Height:        Sensation intact distally Intact pulses distally Dorsiflexion/Plantar flexion intact Incision:dressings c/d/i Compartment soft   Lab Results  Component Value Date   WBC 14.0 (H) 12/17/2021   HGB 9.7 (L) 12/17/2021   HCT 30.8 (L) 12/17/2021   MCV 95.1 12/17/2021   PLT 182 12/17/2021   BMET    Component Value Date/Time   NA 139 12/17/2021 0402   K 4.1 12/17/2021 0402   CL 109 12/17/2021 0402   CO2 24 12/17/2021 0402   GLUCOSE 182 (H) 12/17/2021 0402   BUN 9 12/17/2021 0402   CREATININE 0.71 12/17/2021 0402   CALCIUM 8.5 (L) 12/17/2021 0402   GFRNONAA >60 12/17/2021 0402     Assessment/Plan: 5 Days Post-Op   Principal Problem:   Closed fracture of left distal femur (HCC) Active Problems:   Essential hypertension   Hypomagnesemia   Leukocytosis   GAD (generalized anxiety disorder)   Mild intermittent asthma   GERD (gastroesophageal reflux disease)   HLD (hyperlipidemia)   Status post ORIF left periprosthetic distal femur fracture 12/16/2021  Post op recs: WB: TTWB LLE x6 weeks, Bledsoe brace with range of motion 0-30 Abx: ancef x23 hours post op Imaging: PACU xrays Dressing: keep intact until follow up, change PRN if soiled or saturated. DVT prophylaxis: Eliquis 2.5mg  BID POD1 x4 weeks Follow up: 2 weeks after surgery for a wound check with Dr. 05-01-1970 at [facility]  Address: [address], [place], [place]   Office Phone: [phone]   [phone] 12/21/2021, 7:27 AM   12/23/2021, MD  Contact information:   [phone] 7am-5pm epic message Dr. [email or web address], or call office for patient follow up: [phone] After hours and holidays please check [email or web address] for group call information for [facility]

## 2021-12-22 DIAGNOSIS — S72492A Other fracture of lower end of left femur, initial encounter for closed fracture: Secondary | ICD-10-CM | POA: Diagnosis not present

## 2021-12-22 MED ORDER — ALUM & MAG HYDROXIDE-SIMETH 200-200-20 MG/5ML PO SUSP
30.0000 mL | ORAL | Status: DC | PRN
  Administered 2021-12-22: 30 mL via ORAL
  Filled 2021-12-22: qty 30

## 2021-12-22 NOTE — Progress Notes (Signed)
[facility]ICAL MEDICINE AND REHABILITATION  CONSULT SERVICE NOTE   61 yo female with hx of left TKA on 12/14/21  who fell at home on stairs the next day and suffered a distal femoral fracture with extension to the arthroplasty. Pt seen by orthopedics who performed ORIF on 12/16/21. Post-operatively pt is TTWB x 6 weeks with bledsoe brace set at 0-30 degrees. She's on eliquis for DVT prophylaxis for 4 weeks. Hypertension treated with toprol xl. Mild post-op anemia noted. Patient has been working with PT and has needed verbal cueing for WB and sequencing of tasks. As of today, she is at a min to mod assist level for basic transfers and min assist for gait with RW. Mrs. Kochel lives alone with only intermittent assistance and needs to reach a consistent modified independent level to return home safely. Given all the above, she is appropriate for an inpatient rehab admission of approximately 6-8 days to reach modified independent goals.   Ranelle Oyster, MD, [facility] Physical Medicine & Rehabilitation Medical Director Rehabilitation Services 12/22/2021

## 2021-12-22 NOTE — Progress Notes (Signed)
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 Patient tripped while ambulating, and landing on her left knee. Post fall she had severe pain at her left lower extremity, not able to move due to pain, not able to bear weight, prompting her to come to the hospital. Left knee radiograph with acute oblique fracture through the distal femoral diaphysis extending to the level of the arthroplasty.  PT/OT recommended CIR, awaiting placement   Assessment & Plan:  Principal Problem:   Closed fracture of left distal femur (HCC) Active Problems:   Essential hypertension   Hypomagnesemia   Leukocytosis   HLD (hyperlipidemia)   Mild intermittent asthma   GERD (gastroesophageal reflux disease)   GAD (generalized anxiety disorder)     Closed fracture of left distal femur (HCC) status post ORIF 12/16/2021 Postop management by orthopedic including weightbearing precaution, pain medications, DVT prophylaxis with Eliquis 2.5 mg twice daily for 4 weeks, wound care and outpatient follow-up.  PT/OT-CIR, awaiting placement   Essential hypertension Toprol-XL.  IV as needed ordered   Hypomagnesemia As needed repletion   Leukocytosis Suspect reactive   Mild anemia, hemoglobin 9.7 Chronic and slight.  Blood loss anemia.  No obvious signs of blood loss.   HLD (hyperlipidemia) Continue Lipitor   Mild intermittent asthma As needed bronchodilators   GERD (gastroesophageal reflux disease) Daily Protonix   GAD (generalized anxiety disorder) On Zoloft   Obesity Estimated body mass index is 33.05 kg/m as calculated from the following:   Height as of this encounter: 5\' 5"  (1.651 m).   Weight as of this encounter: 90.1 kg.    Family  Communication: no family at the bedside    Disposition: Status is: Inpatient Remains inpatient appropriate because: medically stable pending transfer to inpatient rehab   Planned Discharge Destination: Rehab DVT prophylaxis-Eliquis 2.5 mg twice daily   Subjective: Seen and examined at bedside, no new complaints. Examination:  Constitutional: Not in acute distress Respiratory: Clear to auscultation bilaterally Cardiovascular: Normal sinus rhythm, no rubs Abdomen: Nontender nondistended good bowel sounds Musculoskeletal: No edema noted Skin: No rashes seen, surgical scar appears to be dry and intact Neurologic: CN 2-12 grossly intact.  And nonfocal Psychiatric: Normal judgment and insight. Alert and oriented x 3. Normal mood.  Left lower extremity dressing is in place  Objective: Vitals:   12/21/21 2029 12/22/21 0500 12/22/21 0511 12/22/21 1242  BP: 118/66  106/63 117/60  Pulse: 77  78 74  Resp: 16  18 18   Temp: 97.8 F (36.6 C)  97.9 F (36.6 C) 98.9 F (37.2 C)  TempSrc: Oral  Oral Oral  SpO2: 98%  96% 96%  Weight:  92.1 kg    Height:        Intake/Output Summary (Last 24 hours) at 12/22/2021 1321 Last data filed at 12/22/2021 1000 Gross per 24 hour  Intake 1640 ml  Output --  Net 1640 ml   Filed Weights   12/18/21 0500 12/19/21 0500 12/22/21 0500  Weight: 90 kg 90.1 kg 92.1 kg     Data Reviewed:   CBC: Recent Labs  Lab 12/15/21 1725 12/16/21 0403 12/17/21 0402  WBC 11.9* 9.8 14.0*  NEUTROABS 7.9* 4.6  --   HGB 12.1 10.4* 9.7*  HCT 36.6  32.2* 30.8*  MCV 89.9 92.5 95.1  PLT 222 197 [id number]   Basic Metabolic Panel: Recent Labs  Lab 12/15/21 1725 12/15/21 2244 12/16/21 0403 12/17/21 0402  NA 143  --  142 139  K 3.5  --  3.5 4.1  CL 110  --  107 109  CO2 26  --  29 24  GLUCOSE 108*  --  106* 182*  BUN 7*  --  10 9  CREATININE 0.68  --  0.71 0.71  CALCIUM 8.9  --  8.5* 8.5*  MG  --  1.5* 1.7 2.1   GFR: Estimated Creatinine Clearance: 81.7  mL/min (by C-G formula based on SCr of 0.71 mg/dL). Liver Function Tests: Recent Labs  Lab 12/15/21 1725 12/16/21 0403  AST 21 17  ALT 17 15  ALKPHOS 71 65  BILITOT 0.9 0.9  PROT 6.7 5.8*  ALBUMIN 4.0 3.4*   No results for input(s): "LIPASE", "AMYLASE" in the last 168 hours. No results for input(s): "AMMONIA" in the last 168 hours. Coagulation Profile: Recent Labs  Lab 12/16/21 0403  INR 1.1   Cardiac Enzymes: No results for input(s): "CKTOTAL", "CKMB", "CKMBINDEX", "TROPONINI" in the last 168 hours. BNP (last 3 results) No results for input(s): "PROBNP" in the last 8760 hours. HbA1C: No results for input(s): "HGBA1C" in the last 72 hours. CBG: No results for input(s): "GLUCAP" in the last 168 hours. Lipid Profile: No results for input(s): "CHOL", "HDL", "LDLCALC", "TRIG", "CHOLHDL", "LDLDIRECT" in the last 72 hours. Thyroid Function Tests: No results for input(s): "TSH", "T4TOTAL", "FREET4", "T3FREE", "THYROIDAB" in the last 72 hours. Anemia Panel: No results for input(s): "VITAMINB12", "FOLATE", "FERRITIN", "TIBC", "IRON", "RETICCTPCT" in the last 72 hours. Sepsis Labs: No results for input(s): "PROCALCITON", "LATICACIDVEN" in the last 168 hours.  Recent Results (from the past 240 hour(s))  Surgical PCR screen     Status: None   Collection Time: 12/16/21  4:00 AM   Specimen: Nasal Mucosa; Nasal Swab  Result Value Ref Range Status   MRSA, PCR NEGATIVE NEGATIVE Final   Staphylococcus aureus NEGATIVE NEGATIVE Final    Comment: (NOTE) The Xpert SA Assay (FDA approved for NASAL specimens in patients 63 years of age and older), is one component of a comprehensive surveillance program. It is not intended to diagnose infection nor to guide or monitor treatment. Performed at [facility], [place] [address], [place], [place] [address]          Radiology Studies: No results found.      Scheduled Meds:  apixaban  2.5 mg Oral BID   atorvastatin   40 mg Oral Daily   fluticasone  2 spray Each Nare Daily   metoprolol succinate  25 mg Oral Daily   pantoprazole  40 mg Oral Daily   polyethylene glycol  17 g Oral Daily   sertraline  100 mg Oral QHS   Continuous Infusions:   LOS: 7 days   Time spent= 35 mins    Lus Kriegel Arsenio Loader, MD [facility]  If 7PM-7AM, please contact night-coverage  12/22/2021, 1:21 PM

## 2021-12-22 NOTE — Progress Notes (Signed)
Inpatient Rehab Admissions Coordinator:   Spoke with patient on phone, she is still interested in CIR. Medicaid is requesting additional note from Physical Medicine MD. Will get that information out as soon as possible for potential admission to CIR soon. Continuing to follow.   Rehab Admissons Coordinator [place], [place], [place] [phone]

## 2021-12-22 NOTE — Progress Notes (Signed)
Physical Therapy Treatment Patient Details Name: Meghan Bowman MRN: [id number] DOB: 1960-08-21 Today's Date: 12/22/2021   History of Present Illness Pt is a 61 y/o female admitted after a fall at home resulting in periprosthetic fracture of the distal left femur on 12/15/21. Pt underwent ORIF of L LE on 11/22. PMH: HTN, HLD, asthma, L TKA.    PT Comments    POD # 6 L ORIF S/P fall on stairs at home day after her TKR General Comments: AxO x 3 and very pleasant, positive attitude.  Required ReEducation od use of Bledsoe Brace "all times" as well as "no knee ROM". Pt in bed with NO brace on.  "Yea, I took it off cause it was digging into me".  Re Educated pt on proper use "all times" as well as importance of "NO knee ROM" as brace is locked at 30 degrees.  Reapplied brace and noted 3 areas lacking padding with exposed Velcro up against skin.  Cut 3 foam pieces from her KI to use for those exposed areas.  Re Educated pt on proper Donning of brace.  Demonstrated and instructed how to use a safety belt to self assist LE off bed.  "That's great/helpful", stated pt.  Then assisted with amb pt to bathroom.  Required 50% VC's on proper TTWB as well as sequencing.  Walker adjusted lower to allow increased B UE WBing.  General transfer comment: 50% VC's on proper hand placement  as well as safety with turns while maintaining TTWB.  Also assisted with a toilet transfer.  Amb a short distance in hallway.  Returned to room and performed a few TE's with Brace On.  ABD, SLR, towel squeezes and AP.   Pt also given a photo copy of her leg which included ORIF and multiple screws.  Re Educated importance of TTWB until Surgeon says otherwise.   Pt is highly motivated and would benefit from aggressive Rehab such as Inpt.   Recommendations for follow up therapy are one component of a multi-disciplinary discharge planning process, led by the attending physician.  Recommendations may be updated based on patient status,  additional functional criteria and insurance authorization.  Follow Up Recommendations  Acute inpatient rehab (3hours/day)     Assistance Recommended at Discharge Frequent or constant Supervision/Assistance  Patient can return home with the following     Equipment Recommendations  None recommended by PT    Recommendations for Other Services Rehab consult     Precautions / Restrictions Precautions Precautions: Fall Other Brace: Bledsoe Brace 0 to 30 degrees at all tmes Restrictions Weight Bearing Restrictions: Yes LLE Weight Bearing: Touchdown weight bearing     Mobility  Bed Mobility Overal bed mobility: Needs Assistance Bed Mobility: Supine to Sit     Supine to sit: Min guard, Min assist     General bed mobility comments: demonstarted and instructed on how to use a safety belt to assist L LE off bed.    Transfers Overall transfer level: Needs assistance Equipment used: Rolling walker (2 wheels) Transfers: Sit to/from Stand Sit to Stand: From elevated surface, Min assist, Mod assist Stand pivot transfers: Min assist, Mod assist         General transfer comment: 50% VC's on proper hand placement  as well as safety with turns while maintaining TTWB.  Also assisted with a toilet transfer.    Ambulation/Gait Ambulation/Gait assistance: Min guard, Min assist Gait Distance (Feet): 32 Feet Assistive device: Rolling walker (2 wheels) Gait Pattern/deviations: Step-to pattern  Gait velocity: decreased     General Gait Details: 75% VC's on proper TTWB to increase support B UE's through walker as well as proper sequencing/walker to self distance.  Pt tolerated amb to bathroom then in hallway but required a rest break.  Mild c/o B UE fatigue.   Stairs             Wheelchair Mobility    Modified Rankin (Stroke Patients Only)       Balance                                            Cognition Arousal/Alertness: Awake/alert Behavior  During Therapy: WFL for tasks assessed/performed Overall Cognitive Status: Within Functional Limits for tasks assessed                                 General Comments: AxO x 3 and very pleasant, positive attitude.  Required ReEducation od use of Bledsoe Brace "all times" as well as "no knee ROM".        Exercises      General Comments        Pertinent Vitals/Pain Pain Assessment Pain Assessment: 0-10 Pain Score: 5  Pain Location: LLE Pain Descriptors / Indicators: Grimacing, Guarding, Operative site guarding, Tender Pain Intervention(s): Monitored during session, Premedicated before session, Repositioned, Ice applied    Home Living                          Prior Function            PT Goals (current goals can now be found in the care plan section) Progress towards PT goals: Progressing toward goals    Frequency    Min 4X/week      PT Plan Current plan remains appropriate    Co-evaluation              AM-PAC PT "6 Clicks" Mobility   Outcome Measure  Help needed turning from your back to your side while in a flat bed without using bedrails?: A Little Help needed moving from lying on your back to sitting on the side of a flat bed without using bedrails?: A Little Help needed moving to and from a bed to a chair (including a wheelchair)?: A Little Help needed standing up from a chair using your arms (e.g., wheelchair or bedside chair)?: A Little Help needed to walk in hospital room?: A Lot Help needed climbing 3-5 steps with a railing? : Total 6 Click Score: 15    End of Session Equipment Utilized During Treatment: Gait belt Activity Tolerance: Patient tolerated treatment well Patient left: in chair;with call bell/phone within reach;with chair alarm set Nurse Communication: Mobility status PT Visit Diagnosis: Other abnormalities of gait and mobility (R26.89);Pain;Difficulty in walking, not elsewhere classified (R26.2) Pain -  Right/Left: Left Pain - part of body: Leg     Time: [id number] PT Time Calculation (min) (ACUTE ONLY): 29 min  Charges:  $Gait Training: 8-22 mins $Therapeutic Activity: 8-22 mins                     {Desare Duddy  PTA Acute  Rehabilitation Services Office M-F          [phone] Weekend pager [phone]

## 2021-12-23 DIAGNOSIS — I10 Essential (primary) hypertension: Secondary | ICD-10-CM | POA: Diagnosis not present

## 2021-12-23 DIAGNOSIS — S72492A Other fracture of lower end of left femur, initial encounter for closed fracture: Secondary | ICD-10-CM | POA: Diagnosis not present

## 2021-12-23 LAB — CBC
HCT: 27.5 % — ABNORMAL LOW (ref 36.0–46.0)
Hemoglobin: 8.8 g/dL — ABNORMAL LOW (ref 12.0–15.0)
MCH: 30 pg (ref 26.0–34.0)
MCHC: 32 g/dL (ref 30.0–36.0)
MCV: 93.9 fL (ref 80.0–100.0)
Platelets: 298 10*3/uL (ref 150–400)
RBC: 2.93 MIL/uL — ABNORMAL LOW (ref 3.87–5.11)
RDW: 12.6 % (ref 11.5–15.5)
WBC: 8.8 10*3/uL (ref 4.0–10.5)
nRBC: 0 % (ref 0.0–0.2)

## 2021-12-23 LAB — BASIC METABOLIC PANEL
Anion gap: 6 (ref 5–15)
BUN: 6 mg/dL — ABNORMAL LOW (ref 8–23)
CO2: 30 mmol/L (ref 22–32)
Calcium: 8.5 mg/dL — ABNORMAL LOW (ref 8.9–10.3)
Chloride: 105 mmol/L (ref 98–111)
Creatinine, Ser: 0.64 mg/dL (ref 0.44–1.00)
GFR, Estimated: >60 mL/min (ref >60)
Glucose, Bld: 116 mg/dL — ABNORMAL HIGH (ref 70–99)
Potassium: 3.6 mmol/L (ref 3.5–5.1)
Sodium: 141 mmol/L (ref 135–145)

## 2021-12-23 LAB — MAGNESIUM: Magnesium: 1.8 mg/dL (ref 1.7–2.4)

## 2021-12-23 MED ORDER — OXYCODONE HCL 5 MG PO TABS
5.0000 mg | ORAL_TABLET | Freq: Four times a day (QID) | ORAL | Status: DC | PRN
  Administered 2021-12-23 – 2021-12-24 (×4): 5 mg via ORAL
  Filled 2021-12-23 (×4): qty 1

## 2021-12-23 MED ORDER — METHOCARBAMOL 500 MG PO TABS
750.0000 mg | ORAL_TABLET | Freq: Four times a day (QID) | ORAL | Status: AC
  Administered 2021-12-23 (×2): 750 mg via ORAL
  Administered 2021-12-23: 500 mg via ORAL
  Filled 2021-12-23 (×2): qty 2

## 2021-12-23 MED ORDER — HYDROCODONE-ACETAMINOPHEN 10-325 MG PO TABS
1.0000 | ORAL_TABLET | Freq: Four times a day (QID) | ORAL | Status: DC
  Administered 2021-12-23 – 2021-12-24 (×5): 1 via ORAL
  Filled 2021-12-23 (×5): qty 1

## 2021-12-23 NOTE — Progress Notes (Signed)
Occupational Therapy Treatment Patient Details Name: Meghan Bowman MRN: [id number] DOB: [birth year] Today's Date: 12/23/2021   History of present illness Pt is a 61 y/o female admitted after a fall at home resulting in periprosthetic fracture of the distal left femur on 12/15/21. Pt underwent ORIF of L LE on 11/22. PMH: HTN, HLD, asthma, L TKA.   OT comments  Patient presented with excellent effort and participation. She required SBA for supine to sit, and mod assist for sock management seated EOB, as she was limited by L knee ROM limitations given applied brace. She stood with min guard assist, requiring the use of a RW for support during standing functional activities. She also required intermittent verbal cues for proper walker placement, as she would step too far into the walker. She indicated having 8/10 LLE pain at rest & with activity. She will continue to benefit from further OT services to maximize her independence with self-care tasks.    Recommendations for follow up therapy are one component of a multi-disciplinary discharge planning process, led by the attending physician.  Recommendations may be updated based on patient status, additional functional criteria and insurance authorization.    Follow Up Recommendations  Acute inpatient rehab (3hours/day)     Assistance Recommended at Discharge Intermittent Supervision/Assistance  Patient can return home with the following  Assistance with cooking/housework;Assist for transportation;Help with stairs or ramp for entrance;A little help with bathing/dressing/bathroom;A little help with walking and/or transfers   Equipment Recommendations  BSC/3in1;Other (comment)    Recommendations for Other Services Rehab consult    Precautions / Restrictions Precautions Precautions: Fall Required Braces or Orthoses: Other Brace Other Brace: Bledsoe Brace 0 to 30 degrees at all tmes Restrictions Weight Bearing Restrictions: Yes LLE Weight  Bearing: Touchdown weight bearing       Mobility Bed Mobility Overal bed mobility: Needs Assistance Bed Mobility: Supine to Sit     Supine to sit: Supervision          Transfers   Equipment used: Rolling walker (2 wheels) Transfers: Sit to/from Stand Sit to Stand: Min guard           General transfer comment: Pt required intermittent verbal cues to not step too far into the walker         ADL either performed or assessed with clinical judgement   ADL Overall ADL's : Needs assistance/impaired Eating/Feeding: Independent   Grooming: Set up;Sitting           Upper Body Dressing : Set up;Sitting   Lower Body Dressing: Moderate assistance Lower Body Dressing Details (indicate cue type and reason): Pt performed sock management seated EOB, and was limited by L knee ROM limitations secondary to applied brace.                      Cognition Arousal/Alertness: Awake/alert Behavior During Therapy: WFL for tasks assessed/performed Overall Cognitive Status: Within Functional Limits for tasks assessed              General Comments: AxO x,  and very pleasant, positive attitude.                   Pertinent Vitals/ Pain       Pain Assessment Pain Assessment: 0-10 Pain Score: 8  Pain Location: LLE Pain Intervention(s): Repositioned, Limited activity within patient's tolerance         Frequency  Min 2X/week        Progress Toward Goals  OT Goals(current  goals can now be found in the care plan section)  Progress towards OT goals: Progressing toward goals  Acute Rehab OT Goals OT Goal Formulation: With patient Time For Goal Achievement: 12/31/21 Potential to Achieve Goals: Good  Plan         AM-PAC OT "6 Clicks" Daily Activity     Outcome Measure   Help from another person eating meals?: None Help from another person taking care of personal grooming?: A Little Help from another person toileting, which includes using toliet,  bedpan, or urinal?: A Little Help from another person bathing (including washing, rinsing, drying)?: A Lot Help from another person to put on and taking off regular upper body clothing?: None Help from another person to put on and taking off regular lower body clothing?: A Lot 6 Click Score: 18    End of Session Equipment Utilized During Treatment: Gait belt;Rolling walker (2 wheels);Left knee immobilizer  OT Visit Diagnosis: Unsteadiness on feet (R26.81);Pain   Activity Tolerance Patient tolerated treatment well;Patient limited by pain   Patient Left with call bell/phone within reach;in bed;with bed alarm set   Nurse Communication Mobility status        Time: 1550-1605 OT Time Calculation (min): 15 min  Charges: OT General Charges $OT Visit: 1 Visit OT Treatments $Therapeutic Activity: 8-22 mins    Reuben Likes, OTR/L 12/23/2021, 4:56 PM

## 2021-12-23 NOTE — Progress Notes (Signed)
Physical Therapy Treatment Patient Details Name: Meghan Bowman MRN: [id number] DOB: 01-Sep-1960 Today's Date: 12/23/2021   History of Present Illness Pt is a 61 y/o female admitted after a fall at home resulting in periprosthetic fracture of the distal left femur on 12/15/21. Pt underwent ORIF of L LE on 11/22. PMH: HTN, HLD, asthma, L TKA.    PT Comments    POD # 7 ORIF POD # 8 L TKR Pt AxO x 3 very pleasant in bed with Bledsoe brace on and LE elevated.  Pt did c/o barce was "digging" into her in some spots.  Reapplied and adjust foam straps and Instructed pt that brace tends to "slide down" with use.  Assisted OOB to amb to bathroom and in hallway.  General transfer comment: 50% VC's on proper hand placement  as well as safety with turns while maintaining TTWB.  Also assisted with a toilet transfer. General Gait Details: 50% VC's on proper TTWB to increase support B UE's through walker as well as proper sequencing/walker to self distance.  Pt tolerated amb to bathroom then in hallway but required a rest break.  Mild c/o B UE fatigue.  Increased pain with activity. Positioned in recliner to comfort and applied ICE. Pt would benefit from aggressive Rehab prior to returning home.   Pt awaiting Insurance auth for [employer].  Recommendations for follow up therapy are one component of a multi-disciplinary discharge planning process, led by the attending physician.  Recommendations may be updated based on patient status, additional functional criteria and insurance authorization.  Follow Up Recommendations  Acute inpatient rehab (3hours/day)     Assistance Recommended at Discharge Frequent or constant Supervision/Assistance  Patient can return home with the following Assistance with feeding;Assist for transportation;Help with stairs or ramp for entrance;A lot of help with bathing/dressing/bathroom;A lot of help with walking and/or transfers   Equipment Recommendations  None recommended by PT     Recommendations for Other Services Rehab consult     Precautions / Restrictions Precautions Precautions: Fall Required Braces or Orthoses: Other Brace Other Brace: Bledsoe Brace 0 to 30 degrees at all tmes Restrictions Weight Bearing Restrictions: Yes LLE Weight Bearing: Touchdown weight bearing     Mobility  Bed Mobility   Bed Mobility: Supine to Sit     Supine to sit: Min guard, Min assist     General bed mobility comments: demonstarted and instructed on how to use a safety belt to assist L LE off bed.    Transfers   Equipment used: Rolling walker (2 wheels) Transfers: Sit to/from Stand Sit to Stand: Min guard, Min assist           General transfer comment: 50% VC's on proper hand placement  as well as safety with turns while maintaining TTWB.  Also assisted with a toilet transfer.    Ambulation/Gait Ambulation/Gait assistance: Min guard, Min assist Gait Distance (Feet): 72 Feet Assistive device: Rolling walker (2 wheels) Gait Pattern/deviations: Step-to pattern Gait velocity: decreased     General Gait Details: 50% VC's on proper TTWB to increase support B UE's through walker as well as proper sequencing/walker to self distance.  Pt tolerated amb to bathroom then in hallway but required a rest break.  Mild c/o B UE fatigue.  Increased pain with activity.   Stairs             Wheelchair Mobility    Modified Rankin (Stroke Patients Only)       Balance  Cognition Arousal/Alertness: Awake/alert Behavior During Therapy: WFL for tasks assessed/performed Overall Cognitive Status: Within Functional Limits for tasks assessed                                 General Comments: AxO x 3 and very pleasant, positive attitude.        Exercises      General Comments        Pertinent Vitals/Pain Pain Assessment Pain Assessment: 0-10 Pain Score: 5  Pain Location:  LLE Pain Descriptors / Indicators: Grimacing, Guarding, Operative site guarding, Tender Pain Intervention(s): Monitored during session, Premedicated before session, Repositioned, Ice applied    Home Living                          Prior Function            PT Goals (current goals can now be found in the care plan section) Progress towards PT goals: Progressing toward goals    Frequency    Min 4X/week      PT Plan Current plan remains appropriate    Co-evaluation              AM-PAC PT "6 Clicks" Mobility   Outcome Measure  Help needed turning from your back to your side while in a flat bed without using bedrails?: A Little Help needed moving from lying on your back to sitting on the side of a flat bed without using bedrails?: A Little Help needed moving to and from a bed to a chair (including a wheelchair)?: A Little Help needed standing up from a chair using your arms (e.g., wheelchair or bedside chair)?: A Little Help needed to walk in hospital room?: A Lot Help needed climbing 3-5 steps with a railing? : Total 6 Click Score: 15    End of Session Equipment Utilized During Treatment: Gait belt Activity Tolerance: Patient tolerated treatment well Patient left: in chair;with call bell/phone within reach;with chair alarm set Nurse Communication: Mobility status PT Visit Diagnosis: Other abnormalities of gait and mobility (R26.89);Pain;Difficulty in walking, not elsewhere classified (R26.2) Pain - Right/Left: Left Pain - part of body: Leg     Time: 1100-1125 PT Time Calculation (min) (ACUTE ONLY): 25 min  Charges:  $Gait Training: 8-22 mins $Therapeutic Activity: 8-22 mins                     Felecia Shelling  PTA Acute  Rehabilitation Services Office M-F          [phone] Weekend pager [phone]

## 2021-12-23 NOTE — Progress Notes (Addendum)
Inpatient Rehab Admissions Coordinator:    I continue to await insurance auth for CIR for this pt., I called to check the status of case this morning and it remains pending.  Will follow for potential admit pending insurance auth.   Megan Salon, MS, CCC-SLP Rehab Admissions Coordinator  [phone] (celll) [phone] (office)

## 2021-12-23 NOTE — Plan of Care (Signed)
  Problem: Coping: Goal: Level of anxiety will decrease Outcome: Progressing   Problem: Pain Managment: Goal: General experience of comfort will improve Outcome: Progressing   

## 2021-12-23 NOTE — Progress Notes (Signed)
PROGRESS NOTE  Meghan Bowman  DOB: 1960-08-01  PCP: Knox Royalty, MD QQV:[id number]  DOA: 12/15/2021  LOS: 8 days  Hospital Day: 9  Brief narrative: Meghan Bowman is a 61 y.o. female with PMH significant for obesity, OSA, HTN, HLD, CAD, anxiety/depression/PTSD, GERD, alcohol abuse, osteoarthritis s/p left TKA in the past. 11/21, patient presented to the ED with left knee pain after mechanical fall. Patient tripped while ambulating, landing on her left knee. Post fall she had severe pain at her left lower extremity, unable to move due to pain, not able to bear weight, prompting her to come to the hospital. Left knee x-ray showed acute oblique fracture through the distal femoral diaphysis extending to the level of the arthroplasty.   11/22, underwent ORIF by Dr. Blanchie Dessert PT/OT recommended CIR Awaiting placement.   Subjective: Patient was seen and examined this morning.  Middle-aged Caucasian female.  Looks older for age. Chart reviewed. In the last 24 hours, afebrile, hemodynamically stable, breathing on room air Labs from this morning with normal WBC count, hemoglobin low at 8.8, renal function normal.  At the time of my evaluation, patient was lying comfortably in bed.  She was concerned about nurses not giving her IV Dilaudid and instead offering her oral pain meds. Nurses joined the conversation as well.  Throughout the conversation, patient did not look like she was in pain.  But she rated her pain as 9 out of 10.  Assessment and plan: Closed fracture of left distal femur  S/p ORIF 12/16/2021 Ortho recommends weightbearing precaution Noted DVT prophylaxis with Eliquis 2.5 mg twice daily for 4 weeks Continue wound care and outpatient follow-up.   PT/OT-CIR, awaiting placement  Pain management For pain management, patient is on as needed IV Dilaudid, oral oxycodone and Robaxin. I started her on scheduled Norco, scheduled Robaxin.  I continued as needed IV Dilaudid for  today.  I have made it clear to her that this strategy would be to gradually taper down on the use of IV Dilaudid. Patient denies being on any scheduled pain meds prior to admission. I noticed a history of alcohol abuse, anxiety, depression, PTSD.  Underlying psychiatric history could be playing a role as well.  Acute blood loss anemia Baseline hemoglobin more than 12.   Acute drop in hemoglobin due to long-run fracture and surgery. Did not require blood transfusion this hospitalization. Continue to monitor Recent Labs    12/15/21 1725 12/16/21 0403 12/17/21 0402 12/23/21 0448  HGB 12.1 10.4* 9.7* 8.8*  MCV 89.9 92.5 95.1 93.9    Essential hypertension Toprol-XL.    CAD/HLD Continue Lipitor   Mild intermittent asthma As needed bronchodilators   GERD Continue daily Protonix   GAD (generalized anxiety disorder) Continue Zoloft  Obesity Body mass index is 35.95 kg/m. Patient has been advised to make an attempt to improve diet and exercise patterns to aid in weight loss.   Goals of care   Code Status: Full Code    Mobility: PT eval following.  Inpatient rehab recommended  Infusions:    Scheduled Meds:  apixaban  2.5 mg Oral BID   atorvastatin  40 mg Oral Daily   fluticasone  2 spray Each Nare Daily   metoprolol succinate  25 mg Oral Daily   pantoprazole  40 mg Oral Daily   polyethylene glycol  17 g Oral Daily   sertraline  100 mg Oral QHS    PRN meds: acetaminophen **OR** acetaminophen, albuterol, alum & mag hydroxide-simeth, diphenhydrAMINE, HYDROmorphone (  DILAUDID) injection, melatonin, methocarbamol, naLOXone (NARCAN)  injection, ondansetron (ZOFRAN) IV, mouth rinse, oxyCODONE   Skin assessment:     Nutritional status:  Body mass index is 35.95 kg/m.          Diet:  Diet Order             Diet regular Room service appropriate? Yes; Fluid consistency: Thin  Diet effective now                   DVT prophylaxis:  apixaban (ELIQUIS)  tablet 2.5 mg Start: 12/21/21 1000 SCDs Start: 12/15/21 2053 apixaban (ELIQUIS) tablet 2.5 mg   Antimicrobials: None Fluid: None Consultants: Orthopedics Family Communication: None at bedside  Status is: Inpatient  Continue in-hospital care because: Pending inpatient rehab Level of care: Med-Surg   Dispo: The patient is from: Home              Anticipated d/c is to: CIR              Patient currently is medically stable to d/c.   Difficult to place patient No       Antimicrobials: Anti-infectives (From admission, onward)    Start     Dose/Rate Route Frequency Ordered Stop   12/16/21 2200  ceFAZolin (ANCEF) IVPB 2g/100 mL premix        2 g 200 mL/hr over 30 Minutes Intravenous Every 8 hours 12/16/21 1719 12/17/21 1004   12/16/21 1502  vancomycin (VANCOCIN) powder  Status:  Discontinued          As needed 12/16/21 1502 12/16/21 1711   12/16/21 1300  ceFAZolin (ANCEF) IVPB 2g/100 mL premix        2 g 200 mL/hr over 30 Minutes Intravenous On call to O.R. 12/16/21 1215 12/16/21 1325   12/16/21 1244  ceFAZolin (ANCEF) 2-4 GM/100ML-% IVPB       Note to Pharmacy: Danley Danker L: cabinet override      12/16/21 1244 12/16/21 1325       Objective: Vitals:   12/22/21 2106 12/23/21 0506  BP: 136/88 121/75  Pulse: 69 69  Resp: 18 18  Temp: 98.1 F (36.7 C) 97.8 F (36.6 C)  SpO2: 97% 97%    Intake/Output Summary (Last 24 hours) at 12/23/2021 0848 Last data filed at 12/23/2021 0600 Gross per 24 hour  Intake 1920 ml  Output --  Net 1920 ml   Filed Weights   12/19/21 0500 12/22/21 0500 12/23/21 0500  Weight: 90.1 kg 92.1 kg 98 kg   Weight change: 5.9 kg Body mass index is 35.95 kg/m.   Physical Exam: General exam: Middle-aged Caucasian female.  Not in pain Skin: No rashes, lesions or ulcers. HEENT: Atraumatic, normocephalic, no obvious bleeding Lungs: Clear to auscultation bilaterally CVS: Regular rate and rhythm, no murmur GI/Abd: soft, nontender,  nondistended, bowel sound present CNS: Alert, awake, oriented x 3 Psychiatry: Upset Extremities: No pedal edema, no calf tenderness.  Left leg on the brace  Data Review: I have personally reviewed the laboratory data and studies available.  F/u labs ordered Unresulted Labs (From admission, onward)    None       Signed, Terrilee Croak, MD [facility] 12/23/2021

## 2021-12-24 ENCOUNTER — Inpatient Hospital Stay (HOSPITAL_COMMUNITY): Payer: Medicaid Other

## 2021-12-24 ENCOUNTER — Encounter (HOSPITAL_COMMUNITY): Payer: Self-pay | Admitting: Physical Medicine & Rehabilitation

## 2021-12-24 ENCOUNTER — Other Ambulatory Visit: Payer: Self-pay

## 2021-12-24 ENCOUNTER — Inpatient Hospital Stay (HOSPITAL_COMMUNITY)
Admission: RE | Admit: 2021-12-24 | Discharge: 2021-12-31 | DRG: 560 | Disposition: A | Discharge status: Home / Self Care | Payer: Medicaid Other | Source: Intra-hospital | Attending: Physical Medicine & Rehabilitation | Admitting: Physical Medicine & Rehabilitation

## 2021-12-24 DIAGNOSIS — K219 Gastro-esophageal reflux disease without esophagitis: Secondary | ICD-10-CM | POA: Diagnosis present

## 2021-12-24 DIAGNOSIS — D62 Acute posthemorrhagic anemia: Secondary | ICD-10-CM | POA: Diagnosis present

## 2021-12-24 DIAGNOSIS — Z96659 Presence of unspecified artificial knee joint: Secondary | ICD-10-CM | POA: Diagnosis not present

## 2021-12-24 DIAGNOSIS — Z79899 Other long term (current) drug therapy: Secondary | ICD-10-CM | POA: Diagnosis not present

## 2021-12-24 DIAGNOSIS — Z7951 Long term (current) use of inhaled steroids: Secondary | ICD-10-CM

## 2021-12-24 DIAGNOSIS — Z9081 Acquired absence of spleen: Secondary | ICD-10-CM | POA: Diagnosis not present

## 2021-12-24 DIAGNOSIS — M9712XD Periprosthetic fracture around internal prosthetic left knee joint, subsequent encounter: Secondary | ICD-10-CM

## 2021-12-24 DIAGNOSIS — E785 Hyperlipidemia, unspecified: Secondary | ICD-10-CM | POA: Diagnosis present

## 2021-12-24 DIAGNOSIS — F419 Anxiety disorder, unspecified: Secondary | ICD-10-CM | POA: Diagnosis present

## 2021-12-24 DIAGNOSIS — Z8249 Family history of ischemic heart disease and other diseases of the circulatory system: Secondary | ICD-10-CM | POA: Diagnosis not present

## 2021-12-24 DIAGNOSIS — M7989 Other specified soft tissue disorders: Secondary | ICD-10-CM | POA: Diagnosis present

## 2021-12-24 DIAGNOSIS — F32A Depression, unspecified: Secondary | ICD-10-CM | POA: Diagnosis present

## 2021-12-24 DIAGNOSIS — G894 Chronic pain syndrome: Secondary | ICD-10-CM | POA: Diagnosis present

## 2021-12-24 DIAGNOSIS — E669 Obesity, unspecified: Secondary | ICD-10-CM | POA: Diagnosis present

## 2021-12-24 DIAGNOSIS — K5900 Constipation, unspecified: Secondary | ICD-10-CM | POA: Diagnosis present

## 2021-12-24 DIAGNOSIS — W19XXXD Unspecified fall, subsequent encounter: Secondary | ICD-10-CM | POA: Diagnosis present

## 2021-12-24 DIAGNOSIS — I10 Essential (primary) hypertension: Secondary | ICD-10-CM | POA: Diagnosis present

## 2021-12-24 DIAGNOSIS — F17210 Nicotine dependence, cigarettes, uncomplicated: Secondary | ICD-10-CM | POA: Diagnosis present

## 2021-12-24 DIAGNOSIS — Z888 Allergy status to other drugs, medicaments and biological substances status: Secondary | ICD-10-CM

## 2021-12-24 DIAGNOSIS — M978XXA Periprosthetic fracture around other internal prosthetic joint, initial encounter: Secondary | ICD-10-CM

## 2021-12-24 DIAGNOSIS — Z886 Allergy status to analgesic agent status: Secondary | ICD-10-CM | POA: Diagnosis not present

## 2021-12-24 DIAGNOSIS — M19022 Primary osteoarthritis, left elbow: Secondary | ICD-10-CM | POA: Diagnosis present

## 2021-12-24 DIAGNOSIS — Z9049 Acquired absence of other specified parts of digestive tract: Secondary | ICD-10-CM | POA: Diagnosis not present

## 2021-12-24 DIAGNOSIS — Z716 Tobacco abuse counseling: Secondary | ICD-10-CM

## 2021-12-24 DIAGNOSIS — N3941 Urge incontinence: Secondary | ICD-10-CM | POA: Diagnosis not present

## 2021-12-24 DIAGNOSIS — Z6835 Body mass index (BMI) 35.0-35.9, adult: Secondary | ICD-10-CM

## 2021-12-24 DIAGNOSIS — F4310 Post-traumatic stress disorder, unspecified: Secondary | ICD-10-CM | POA: Diagnosis present

## 2021-12-24 DIAGNOSIS — Z91048 Other nonmedicinal substance allergy status: Secondary | ICD-10-CM

## 2021-12-24 DIAGNOSIS — I2510 Atherosclerotic heart disease of native coronary artery without angina pectoris: Secondary | ICD-10-CM | POA: Diagnosis present

## 2021-12-24 DIAGNOSIS — S72492D Other fracture of lower end of left femur, subsequent encounter for closed fracture with routine healing: Secondary | ICD-10-CM | POA: Diagnosis present

## 2021-12-24 DIAGNOSIS — K5901 Slow transit constipation: Secondary | ICD-10-CM

## 2021-12-24 DIAGNOSIS — R3915 Urgency of urination: Secondary | ICD-10-CM | POA: Diagnosis not present

## 2021-12-24 MED ORDER — FLUTICASONE PROPIONATE 50 MCG/ACT NA SUSP
2.0000 | Freq: Every day | NASAL | Status: DC
  Administered 2021-12-25 – 2021-12-31 (×7): 2 via NASAL
  Filled 2021-12-24: qty 16

## 2021-12-24 MED ORDER — METOPROLOL SUCCINATE ER 25 MG PO TB24
25.0000 mg | ORAL_TABLET | Freq: Every day | ORAL | Status: DC
  Administered 2021-12-25 – 2021-12-31 (×7): 25 mg via ORAL
  Filled 2021-12-24 (×7): qty 1

## 2021-12-24 MED ORDER — PANTOPRAZOLE SODIUM 40 MG PO TBEC
40.0000 mg | DELAYED_RELEASE_TABLET | Freq: Every day | ORAL | Status: DC
  Administered 2021-12-25: 40 mg via ORAL
  Filled 2021-12-24: qty 1

## 2021-12-24 MED ORDER — MELATONIN 3 MG PO TABS
3.0000 mg | ORAL_TABLET | Freq: Every evening | ORAL | Status: DC | PRN
  Administered 2021-12-27 – 2021-12-31 (×4): 3 mg via ORAL
  Filled 2021-12-24 (×5): qty 1

## 2021-12-24 MED ORDER — METHOCARBAMOL 750 MG PO TABS
750.0000 mg | ORAL_TABLET | Freq: Four times a day (QID) | ORAL | Status: AC
  Administered 2021-12-24 – 2021-12-27 (×11): 750 mg via ORAL
  Filled 2021-12-24 (×12): qty 1

## 2021-12-24 MED ORDER — MORPHINE SULFATE (PF) 2 MG/ML IV SOLN: 1.0000 mg | INTRAVENOUS | Status: DC | PRN

## 2021-12-24 MED ORDER — APIXABAN 2.5 MG PO TABS: 2.5000 mg | ORAL_TABLET | Freq: Two times a day (BID) | ORAL | Status: DC

## 2021-12-24 MED ORDER — SENNOSIDES-DOCUSATE SODIUM 8.6-50 MG PO TABS: 1.0000 | ORAL_TABLET | Freq: Every day | ORAL | Status: DC

## 2021-12-24 MED ORDER — HYDROCODONE-ACETAMINOPHEN 10-325 MG PO TABS
1.0000 | ORAL_TABLET | Freq: Four times a day (QID) | ORAL | Status: DC
  Administered 2021-12-24 – 2021-12-31 (×28): 1 via ORAL
  Filled 2021-12-24 (×28): qty 1

## 2021-12-24 MED ORDER — ALBUTEROL SULFATE (2.5 MG/3ML) 0.083% IN NEBU: 2.5000 mg | INHALATION_SOLUTION | RESPIRATORY_TRACT | Status: DC | PRN

## 2021-12-24 MED ORDER — MELATONIN 3 MG PO TABS: 3.0000 mg | ORAL_TABLET | Freq: Every evening | ORAL | 0 refills | Status: DC | PRN

## 2021-12-24 MED ORDER — METHOCARBAMOL 750 MG PO TABS: 750.0000 mg | ORAL_TABLET | Freq: Four times a day (QID) | ORAL | Status: DC | PRN

## 2021-12-24 MED ORDER — METHOCARBAMOL 500 MG PO TABS
750.0000 mg | ORAL_TABLET | Freq: Four times a day (QID) | ORAL | Status: DC
  Administered 2021-12-24: 750 mg via ORAL
  Filled 2021-12-24: qty 2

## 2021-12-24 MED ORDER — SERTRALINE HCL 100 MG PO TABS
100.0000 mg | ORAL_TABLET | Freq: Every day | ORAL | Status: DC
  Administered 2021-12-24 – 2021-12-30 (×7): 100 mg via ORAL
  Filled 2021-12-24 (×7): qty 1

## 2021-12-24 MED ORDER — ALBUTEROL SULFATE HFA 108 (90 BASE) MCG/ACT IN AERS: 1.0000 | INHALATION_SPRAY | Freq: Four times a day (QID) | RESPIRATORY_TRACT | Status: DC | PRN

## 2021-12-24 MED ORDER — POLYETHYLENE GLYCOL 3350 17 G PO PACK
17.0000 g | PACK | Freq: Every day | ORAL | Status: DC
  Administered 2021-12-25 – 2021-12-31 (×4): 17 g via ORAL
  Filled 2021-12-24 (×5): qty 1

## 2021-12-24 MED ORDER — ACETAMINOPHEN 325 MG PO TABS
650.0000 mg | ORAL_TABLET | Freq: Four times a day (QID) | ORAL | Status: DC | PRN
  Administered 2021-12-24 – 2021-12-30 (×5): 650 mg via ORAL
  Filled 2021-12-24 (×8): qty 2

## 2021-12-24 MED ORDER — ACETAMINOPHEN 325 MG PO TABS: 650.0000 mg | ORAL_TABLET | Freq: Four times a day (QID) | ORAL | Status: DC | PRN

## 2021-12-24 MED ORDER — HYDROCODONE-ACETAMINOPHEN 10-325 MG PO TABS: 1.0000 | ORAL_TABLET | Freq: Four times a day (QID) | ORAL | 0 refills | Status: DC | PRN

## 2021-12-24 MED ORDER — ATORVASTATIN CALCIUM 40 MG PO TABS
40.0000 mg | ORAL_TABLET | Freq: Every day | ORAL | Status: DC
  Administered 2021-12-25 – 2021-12-31 (×7): 40 mg via ORAL
  Filled 2021-12-24 (×7): qty 1

## 2021-12-24 MED ORDER — APIXABAN 2.5 MG PO TABS
2.5000 mg | ORAL_TABLET | Freq: Two times a day (BID) | ORAL | Status: DC
  Administered 2021-12-24 – 2021-12-31 (×14): 2.5 mg via ORAL
  Filled 2021-12-24 (×14): qty 1

## 2021-12-24 MED ORDER — ALUM & MAG HYDROXIDE-SIMETH 200-200-20 MG/5ML PO SUSP
30.0000 mL | ORAL | Status: DC | PRN
  Administered 2021-12-24 – 2021-12-25 (×2): 30 mL via ORAL
  Filled 2021-12-24 (×2): qty 30

## 2021-12-24 MED ORDER — ACETAMINOPHEN 650 MG RE SUPP: 650.0000 mg | Freq: Four times a day (QID) | RECTAL | Status: DC | PRN

## 2021-12-24 MED ORDER — POLYETHYLENE GLYCOL 3350 17 G PO PACK: 17.0000 g | PACK | Freq: Every day | ORAL | 0 refills | Status: AC

## 2021-12-24 NOTE — Progress Notes (Signed)
Inpatient Rehabilitation Admission Medication Review by a Pharmacist  A complete drug regimen review was completed for this patient to identify any potential clinically significant medication issues.  High Risk Drug Classes Is patient taking? Indication by Medication  Antipsychotic No   Anticoagulant Yes Apixaban- VTE ppx X4 weeks (started 12/21/2021 Ends: 01/18/2022)  Antibiotic No   Opioid Yes Norco- acute pain  Antiplatelet No   Hypoglycemics/insulin No   Vasoactive Medication Yes Toprol- HTN  Chemotherapy No   Other Yes Lipitor- HLD Robaxin- muscle spasms Protonix- GERD Zoloft- MDD Melatonin- sleep     Type of Medication Issue Identified Description of Issue Recommendation(s)  Drug Interaction(s) (clinically significant)     Duplicate Therapy     Allergy     No Medication Administration End Date     Incorrect Dose     Additional Drug Therapy Needed     Significant med changes from prior encounter (inform family/care partners about these prior to discharge).    Other  PTA meds: Symbicort Xyzal NitroSL Ergocalciferol Restart PTA meds when and if necessary during CIR admission or at time of discharge, if warranted    Clinically significant medication issues were identified that warrant physician communication and completion of prescribed/recommended actions by midnight of the next day:  No  Name of provider notified for urgent issues identified:   Provider Method of Notification:     Pharmacist comments:   Time spent performing this drug regimen review (minutes):  30   Halima Fogal BS, PharmD, BCPS Clinical Pharmacist 12/24/2021 1:37 PM  Contact: [phone] after 3 PM  "Be curious, not judgmental..." -Debbora Dus

## 2021-12-24 NOTE — Discharge Summary (Addendum)
Physician Discharge Summary  Meghan Bowman:[id number] DOB: [birth year] DOA: 12/15/2021  PCP: Knox Royalty, MD  Admit date: 12/15/2021 Discharge date: 12/24/2021  Admitted From: Home Discharge disposition: See AIR  Brief narrative: Meghan Bowman is a 61 y.o. female with PMH significant for obesity, OSA, HTN, HLD, CAD, anxiety/depression/PTSD, GERD, alcohol abuse, osteoarthritis s/p left TKA in the past. 11/21, patient presented to the ED with left knee pain after mechanical fall. Patient tripped while ambulating, landing on her left knee. Post fall she had severe pain at her left lower extremity, unable to move due to pain, not able to bear weight, prompting her to come to the hospital. Left knee x-ray showed acute oblique fracture through the distal femoral diaphysis extending to the level of the arthroplasty.   11/22, underwent ORIF by Dr. Blanchie Dessert PT/OT recommended CIR Awaiting placement.   Subjective: Patient was seen and examined this morning.  Middle-aged Caucasian female.  Looks older for age. Chart reviewed. In the last 24 hours, afebrile, hemodynamically stable, breathing on room air Labs from this morning with normal WBC count, hemoglobin low at 8.8, renal function normal.  At the time of my evaluation, patient was lying comfortably in bed.  She was concerned about nurses not giving her IV Dilaudid and instead offering her oral pain meds. Nurses joined the conversation as well.  Throughout the conversation, patient did not look like she was in pain.  But she rated her pain as 9 out of 10.  Hospital course: Closed fracture of left distal femur  S/p ORIF 12/16/2021 Ortho recommends weightbearing precaution Noted DVT prophylaxis with Eliquis 2.5 mg twice daily for 4 weeks Continue wound care and outpatient follow-up.    Pain management For pain management, patient is on as needed IV Dilaudid, oral oxycodone and Robaxin. I started her on scheduled Norco,  scheduled Robaxin.  I continued as needed IV Dilaudid for today.  I have made it clear to her that this strategy would be to gradually taper down on the use of IV Dilaudid. Patient denies being on any scheduled pain meds prior to admission. I noticed a history of alcohol abuse, anxiety, depression, PTSD.  Underlying psychiatric history could be playing a role as well.  Acute blood loss anemia Baseline hemoglobin more than 12.   Acute drop in hemoglobin due to long-run fracture and surgery. Did not require blood transfusion this hospitalization. Recent Labs    12/15/21 1725 12/16/21 0403 12/17/21 0402 12/23/21 0448 12/25/21 0605  HGB 12.1 10.4* 9.7* 8.8* 9.1*  MCV 89.9 92.5 95.1 93.9 91.8    Essential hypertension Toprol-XL.    CAD/HLD Continue Lipitor   Mild intermittent asthma As needed bronchodilators   GERD Continue daily Protonix   GAD (generalized anxiety disorder) Continue Zoloft  Obesity Body mass index is 36.06 kg/m. Patient has been advised to make an attempt to improve diet and exercise patterns to aid in weight loss.  Wounds:  - Incision (Closed) 12/16/21 Knee Left (Active)  Date First Assessed/Time First Assessed: 12/16/21 1519   Location: Knee  Location Orientation: Left    Assessments 12/16/2021  3:56 PM 12/25/2021  9:45 PM  Dressing Type Gauze (Comment);Transparent dressing;Compression wrap Compression wrap;Gauze (Comment)  Dressing Clean, Dry, Intact Clean, Dry, Intact  Site / Wound Assessment Dressing in place / Unable to assess Dressing in place / Unable to assess     No associated orders.    Discharge Exam:   Vitals:   12/23/21 1243 12/23/21 2126 12/24/21 0500  12/24/21 0611  BP: 104/69 109/66  (!) 112/47  Pulse: 75 77  72  Resp: Temp: 97.9 F (36.6 C) 97.7 F (36.5 C)  98 F (36.7 C)  TempSrc: Oral Oral  Oral  SpO2: 97% 96%  98%  Weight:   98.3 kg   Height:        Body mass index is 36.06 kg/m.  General exam:  Middle-aged Caucasian female.  Not in pain Skin: No rashes, lesions or ulcers. HEENT: Atraumatic, normocephalic, no obvious bleeding Lungs: Clear to auscultation bilaterally CVS: Regular rate and rhythm, no murmur GI/Abd: soft, nontender, nondistended, bowel sound present CNS: Alert, awake, oriented x 3 Psychiatry: Mood appropriate Extremities: No pedal edema, no calf tenderness.  Left leg on the brace  Follow ups:    Follow-up Information     Knox Royalty, MD Follow up.   Specialty: Family Medicine Contact information: [address] [place] [address] [phone]                 Discharge Instructions:   Discharge Instructions     Call MD for:  difficulty breathing, headache or visual disturbances   Complete by: As directed    Call MD for:  extreme fatigue   Complete by: As directed    Call MD for:  hives   Complete by: As directed    Call MD for:  persistant dizziness or light-headedness   Complete by: As directed    Call MD for:  persistant nausea and vomiting   Complete by: As directed    Call MD for:  severe uncontrolled pain   Complete by: As directed    Call MD for:  temperature >100.4   Complete by: As directed    Diet - low sodium heart healthy   Complete by: As directed    Discharge instructions   Complete by: As directed    General discharge instructions: Follow with Primary MD Knox Royalty, MD in 7 days  Please request your PCP  to go over your hospital tests, procedures, radiology results at the follow up. Please get your medicines reviewed and adjusted.  Your PCP may decide to repeat certain labs or tests as needed. Do not drive, operate heavy machinery, perform activities at heights, swimming or participation in water activities or provide baby sitting services if your were admitted for syncope or siezures until you have seen by Primary MD or a Neurologist and advised to do so again. [place] Controlled Substance Reporting System  database was reviewed. Do not drive, operate heavy machinery, perform activities at heights, swim, participate in water activities or provide baby-sitting services while on medications for pain, sleep and mood until your outpatient physician has reevaluated you and advised to do so again.  You are strongly recommended to comply with the dose, frequency and duration of prescribed medications. Activity: As tolerated with Full fall precautions use walker/cane & assistance as needed Avoid using any recreational substances like cigarette, tobacco, alcohol, or non-prescribed drug. If you experience worsening of your admission symptoms, develop shortness of breath, life threatening emergency, suicidal or homicidal thoughts you must seek medical attention immediately by calling 911 or calling your MD immediately  if symptoms less severe. You must read complete instructions/literature along with all the possible adverse reactions/side effects for all the medicines you take and that have been prescribed to you. Take any new medicine only after you have completely understood and accepted all the possible adverse reactions/side effects.  Wear Seat belts while driving. You were cared for by a hospitalist during your hospital stay. If you have any questions about your discharge medications or the care you received while you were in the hospital after you are discharged, you can call the unit and ask to speak with the hospitalist or the covering physician. Once you are discharged, your primary care physician will handle any further medical issues. Please note that NO REFILLS for any discharge medications will be authorized once you are discharged, as it is imperative that you return to your primary care physician (or establish a relationship with a primary care physician if you do not have one).   Discharge wound care:   Complete by: As directed    Increase activity slowly   Complete by: As directed        Discharge  Medications:   Allergies as of 12/24/2021       Reactions   Ibuprofen Other (See Comments)   Due to stroke   Other Other (See Comments)   Black mold - Causes lungs to collapse  PLEASE BE AWARE THAT PT IS A RECOVERING ALCOHOLIC AND DRUG ADDICT AND WOULD RATHER NOT USE PAIN MEDICATION ON CONTINUOUS BASIS  AFTER LEAVING THE HOSPITAL   Tape Itching, Rash   Please use "paper" tape  Bandages are worse        Medication List     STOP taking these medications    amLODipine 5 MG tablet Commonly known as: NORVASC   ondansetron 4 MG disintegrating tablet Commonly known as: Zofran ODT   oxyCODONE 5 MG immediate release tablet Commonly known as: Roxicodone   pregabalin 50 MG capsule Commonly known as: LYRICA       TAKE these medications    acetaminophen 325 MG tablet Commonly known as: TYLENOL Take 2 tablets (650 mg total) by mouth every 6 (six) hours as needed for mild pain (or Fever >/= 101).   albuterol 108 (90 Base) MCG/ACT inhaler Commonly known as: VENTOLIN HFA Inhale 2 puffs into the lungs every 6 (six) hours as needed for wheezing or shortness of breath.   amoxicillin 500 MG capsule Commonly known as: AMOXIL Take 2,000 mg by mouth daily as needed (For dental procedures only).   apixaban 2.5 MG Tabs tablet Commonly known as: ELIQUIS Take 1 tablet (2.5 mg total) by mouth 2 (two) times daily for 28 days.   atorvastatin 40 MG tablet Commonly known as: LIPITOR Take 40 mg by mouth daily.   budesonide-formoterol 80-4.5 MCG/ACT inhaler Commonly known as: SYMBICORT Inhale 2 puffs into the lungs 2 (two) times daily as needed (wheezing).   fluticasone 50 MCG/ACT nasal spray Commonly known as: FLONASE Place 1 spray into both nostrils daily as needed for allergies.   HYDROcodone-acetaminophen 10-325 MG tablet Commonly known as: NORCO Take 1 tablet by mouth every 6 (six) hours as needed.   levocetirizine 5 MG tablet Commonly known as: XYZAL Take 5 mg by mouth at  bedtime.   meclizine 25 MG tablet Commonly known as: ANTIVERT Take 1 tablet (25 mg total) by mouth 3 (three) times daily as needed for dizziness.   melatonin 3 MG Tabs tablet Take 1 tablet (3 mg total) by mouth at bedtime as needed (insomnia).   methocarbamol 750 MG tablet Commonly known as: ROBAXIN Take 1 tablet (750 mg total) by mouth every 6 (six) hours as needed for muscle spasms.   metoprolol succinate 25 MG 24 hr tablet Commonly known as: TOPROL-XL Take 25 mg by  mouth daily.   nitroGLYCERIN 0.4 MG SL tablet Commonly known as: NITROSTAT Place 0.4 mg under the tongue every 5 (five) minutes as needed for chest pain.   omeprazole 40 MG capsule Commonly known as: PRILOSEC Take 40 mg by mouth daily.   polyethylene glycol 17 g packet Commonly known as: MIRALAX / GLYCOLAX Take 17 g by mouth daily.   potassium chloride SA 20 MEQ tablet Commonly known as: KLOR-CON M Take 1 tablet (20 mEq total) by mouth 2 (two) times daily.   PRESERVISION AREDS 2+MULTI VIT PO Take 1 capsule by mouth at bedtime.   senna-docusate 8.6-50 MG tablet Commonly known as: Senokot-S Take 1 tablet by mouth daily.   sertraline 100 MG tablet Commonly known as: ZOLOFT Take 100 mg by mouth at bedtime.   tobramycin 0.3 % ophthalmic solution Commonly known as: TOBREX Place 1 drop into the left eye See admin instructions. Instill 1 drop to left eye four times a day the day before injection and the day after four times a day   Vitamin D (Ergocalciferol) 1.25 MG (50000 UNIT) Caps capsule Commonly known as: DRISDOL Take 50,000 Units by mouth every 7 (seven) days.               Discharge Care Instructions  (From admission, onward)           Start     Ordered   12/24/21 0000  Discharge wound care:        12/24/21 1105             The results of significant diagnostics from this hospitalization (including imaging, microbiology, ancillary and laboratory) are listed below for  reference.    Procedures and Diagnostic Studies:   VAS [place] LOWER EXTREMITY VENOUS (DVT)  Result Date: 12/26/2021  Lower Venous DVT Study Patient Name:  CATELYN FRIEL  Date of Exam:   12/25/2021 Medical Rec #: [id number]       Accession #:    [id number] Date of Birth: Oct 01, 1960      Patient Gender: F Patient Age:   34 years Exam Location:  [facility] Procedure:      VAS [place] LOWER EXTREMITY VENOUS (DVT) Referring Phys: Mariam Dollar --------------------------------------------------------------------------------  Indications: Swelling.  Risk Factors: None identified. Limitations: Poor ultrasound/tissue interface, bandages and open wound. Comparison Study: No prior studies. Performing Technologist: Chanda Busing RVT  Examination Guidelines: A complete evaluation includes B-mode imaging, spectral Doppler, color Doppler, and power Doppler as needed of all accessible portions of each vessel. Bilateral testing is considered an integral part of a complete examination. Limited examinations for reoccurring indications may be performed as noted. The reflux portion of the exam is performed with the patient in reverse Trendelenburg.  +---------+---------------+---------+-----------+----------+--------------+ RIGHT    CompressibilityPhasicitySpontaneityPropertiesThrombus Aging +---------+---------------+---------+-----------+----------+--------------+ CFV      Full           Yes      Yes                                 +---------+---------------+---------+-----------+----------+--------------+ SFJ      Full                                                        +---------+---------------+---------+-----------+----------+--------------+ FV Prox  Full                                                        +---------+---------------+---------+-----------+----------+--------------+ FV Mid   Full                                                         +---------+---------------+---------+-----------+----------+--------------+ FV DistalFull                                                        +---------+---------------+---------+-----------+----------+--------------+ PFV      Full                                                        +---------+---------------+---------+-----------+----------+--------------+ POP      Full           Yes      Yes                                 +---------+---------------+---------+-----------+----------+--------------+ PTV      Full                                                        +---------+---------------+---------+-----------+----------+--------------+ PERO     Full                                                        +---------+---------------+---------+-----------+----------+--------------+   +---------+---------------+---------+-----------+----------+--------------+ LEFT     CompressibilityPhasicitySpontaneityPropertiesThrombus Aging +---------+---------------+---------+-----------+----------+--------------+ CFV      Full           Yes      Yes                                 +---------+---------------+---------+-----------+----------+--------------+ SFJ      Full                                                        +---------+---------------+---------+-----------+----------+--------------+ FV Prox  Full                                                        +---------+---------------+---------+-----------+----------+--------------+  FV Mid                  Yes      Yes                                 +---------+---------------+---------+-----------+----------+--------------+ FV Distal               Yes      Yes                                 +---------+---------------+---------+-----------+----------+--------------+ PFV      Full                                                         +---------+---------------+---------+-----------+----------+--------------+ POP      Full           Yes      Yes                                 +---------+---------------+---------+-----------+----------+--------------+ PTV      Full                                                        +---------+---------------+---------+-----------+----------+--------------+ PERO     Full                                                        +---------+---------------+---------+-----------+----------+--------------+     Summary: RIGHT: - There is no evidence of deep vein thrombosis in the lower extremity.  - No cystic structure found in the popliteal fossa.  LEFT: - There is no evidence of deep vein thrombosis in the lower extremity. However, portions of this examination were limited- see technologist comments above.  - No cystic structure found in the popliteal fossa.  *See table(s) above for measurements and observations. Electronically signed by Gerarda Fraction on 12/26/2021 at 1:26:20 PM.    Final    DG Elbow 2 Views Left  Result Date: 12/25/2021 CLINICAL DATA:  Left elbow pain. EXAM: LEFT ELBOW - 2 VIEW COMPARISON:  None available FINDINGS: Mild medial elbow joint space narrowing and peripheral trochlea/coronoid process degenerative spurring. Minimal degenerative spurring at the far medial aspect of the radial head articular surface. No definite elbow joint effusion. No acute fracture line is seen. No dislocation. IMPRESSION: Mild medial elbow osteoarthritis. No acute fracture is seen. Electronically Signed   By: Neita Garnet M.D.   On: 12/25/2021 10:28   DG FEMUR PORT MIN 2 VIEWS LEFT  Result Date: 12/24/2021 CLINICAL DATA:  Postop EXAM: LEFT FEMUR PORTABLE 4 VIEWS COMPARISON:  12/16/2021 FINDINGS: ORIF of distal femoral fracture and postop changes total knee arthroplasty again noted. Grossly anatomic alignment. No change compared to prior study. IMPRESSION: Postop changes. Electronically  Signed   By: Ivin Booty  Pleasure M.D.   On: 12/24/2021 08:34     Labs:   Basic Metabolic Panel: Recent Labs  Lab 12/23/21 0448 12/25/21 0605  NA 141 138  K 3.6 3.6  CL 105 107  CO2 30 25  GLUCOSE 116* 133*  BUN 6* 5*  CREATININE 0.64 0.63  CALCIUM 8.5* 8.9  MG 1.8  --    GFR Estimated Creatinine Clearance: 84.6 mL/min (by C-G formula based on SCr of 0.63 mg/dL). Liver Function Tests: Recent Labs  Lab 12/25/21 0605  AST 18  ALT 13  ALKPHOS 59  BILITOT 0.8  PROT 5.3*  ALBUMIN 2.8*   No results for input(s): "LIPASE", "AMYLASE" in the last 168 hours. No results for input(s): "AMMONIA" in the last 168 hours. Coagulation profile No results for input(s): "INR", "PROTIME" in the last 168 hours.  CBC: Recent Labs  Lab 12/23/21 0448 12/25/21 0605  WBC 8.8 8.9  NEUTROABS  --  4.6  HGB 8.8* 9.1*  HCT 27.5* 28.0*  MCV 93.9 91.8  PLT 298 339   Cardiac Enzymes: No results for input(s): "CKTOTAL", "CKMB", "CKMBINDEX", "TROPONINI" in the last 168 hours. BNP: Invalid input(s): "POCBNP" CBG: No results for input(s): "GLUCAP" in the last 168 hours. D-Dimer No results for input(s): "DDIMER" in the last 72 hours. Hgb A1c No results for input(s): "HGBA1C" in the last 72 hours. Lipid Profile No results for input(s): "CHOL", "HDL", "LDLCALC", "TRIG", "CHOLHDL", "LDLDIRECT" in the last 72 hours. Thyroid function studies No results for input(s): "TSH", "T4TOTAL", "T3FREE", "THYROIDAB" in the last 72 hours.  Invalid input(s): "FREET3" Anemia work up No results for input(s): "VITAMINB12", "FOLATE", "FERRITIN", "TIBC", "IRON", "RETICCTPCT" in the last 72 hours. Microbiology No results found for this or any previous visit (from the past 240 hour(s)).  Time coordinating discharge: 35 minutes  Signed: Delma Villalva  [facility] 12/26/2021, 2:54 PM

## 2021-12-24 NOTE — Discharge Instructions (Addendum)
Inpatient Rehab Discharge Instructions  Meghan Bowman Discharge date and time: No discharge date for patient encounter.   Activities/Precautions/ Functional Status: Activity: Touch down weight bearing with brace 0-30 degree all times Diet: regular diet Wound Care: Routine skin checks Functional status:  ___ No restrictions     ___ Walk up steps independently ___ 24/7 supervision/assistance   ___ Walk up steps with assistance ___ Intermittent supervision/assistance  ___ Bathe/dress independently ___ Walk with walker     _x__ Bathe/dress with assistance ___ Walk Independently    ___ Shower independently ___ Walk with assistance    ___ Shower with assistance ___ No alcohol     ___ Return to work/school ________ COMMUNITY REFERRALS UPON DISCHARGE:    Home Health:   PT     OT                      Agency: TBD upon insurance approval Phone:    Medical Equipment/Items Ordered: Arts development officer, Educational psychologist                                                 Agency/Supplier: [employer]  [phone]   Special Instructions: No driving smoking or alcohol  Eliquis 2.5 mg twice daily until 01/15/2022 then stop    My questions have been answered and I understand these instructions. I will adhere to these goals and the provided educational materials after my discharge from the hospital.  Patient/Caregiver Signature _______________________________ Date __________  Clinician Signature _______________________________________ Date __________  Please bring this form and your medication list with you to all your follow-up doctor's appointments.

## 2021-12-24 NOTE — Progress Notes (Signed)
     Subjective: Patient is doing well this morning.  Pain well-controlled.  Reportedly was confused overnight but seems to be alert and oriented this morning.  Denies distal numbness and tingling.  Reports that she has been getting around well with the use of the walker.  Discussed plan for repeat x-ray today for routine follow-up given that she may discharge either to home versus inpatient rehab as early as later today.  Discussed clinic follow-up in 1 week for wound check and suture removal.   Objective:   VITALS:   Vitals:   12/23/21 1243 12/23/21 2126 12/24/21 0500 12/24/21 0611  BP: 104/69 109/66  (!) 112/47  Pulse: 75 77  72  Resp: 17 16  16   Temp: 97.9 F (36.6 C) 97.7 F (36.5 C)  98 F (36.7 C)  TempSrc: Oral Oral  Oral  SpO2: 97% 96%  98%  Weight:   98.3 kg   Height:        Sensation intact distally Intact pulses distally Dorsiflexion/Plantar flexion intact Incision:dressings c/d/i Compartment soft   Lab Results  Component Value Date   WBC 8.8 12/23/2021   HGB 8.8 (L) 12/23/2021   HCT 27.5 (L) 12/23/2021   MCV 93.9 12/23/2021   PLT 298 12/23/2021   BMET    Component Value Date/Time   NA 141 12/23/2021 0448   K 3.6 12/23/2021 0448   CL 105 12/23/2021 0448   CO2 30 12/23/2021 0448   GLUCOSE 116 (H) 12/23/2021 0448   BUN 6 (L) 12/23/2021 0448   CREATININE 0.64 12/23/2021 0448   CALCIUM 8.5 (L) 12/23/2021 0448   GFRNONAA >60 12/23/2021 0448     Assessment/Plan: 8 Days Post-Op   Principal Problem:   Closed fracture of left distal femur (HCC) Active Problems:   Essential hypertension   Hypomagnesemia   Leukocytosis   GAD (generalized anxiety disorder)   Mild intermittent asthma   GERD (gastroesophageal reflux disease)   HLD (hyperlipidemia)   Status post ORIF left periprosthetic distal femur fracture 12/16/2021  Post op recs: WB: TTWB LLE x6 weeks, Bledsoe brace with range of motion 0-30 Abx: ancef x23 hours post op Imaging: PACU  xrays Dressing: keep intact until follow up, change PRN if soiled or saturated. DVT prophylaxis: Eliquis 2.5mg  BID POD1 x4 weeks Follow up: 2 weeks after surgery for a wound check with Dr. [id number] at [facility]  Address: [address], [place], [place]  Office Phone: [phone]   [phone] 12/24/2021, 7:04 AM   12/26/2021, MD  Contact information:   [phone] 7am-5pm epic message Dr. [id number], or call office for patient follow up: [phone] After hours and holidays please check [email or web address] for group call information for [facility]
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
## 2021-12-24 NOTE — Progress Notes (Signed)
Patient asking for "liquid" pain medicine (meaning Dilaudid) very frequently. Explained that she has scheduled norco and robaxin and also prn oxycodone for in between. Meds have been given as scheduled. Patient sleeping everytime this writer rounds but when woken up for meds will state her pain is a 9. Patient appears in no distress, has been ambulating independently around the room and to the bathroom, and will ask questions like "when can I ask for the oxy next just so I have it in my head" and also stated "This is just a 5" when I gave her the oxycodone. Patient is also now saying that she wants her granddaughter to pick her up in the morning because "those nurses on dayshift told me they won't give any pain medicine at rehab".  Encouraged patient to talk to rounding practitioner in the morning to get accurate information. Will continue to monitor and assess for pain relief needs.
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
## 2021-12-24 NOTE — Progress Notes (Addendum)
[facility] IP rehab admissions - We have approval for CIR admission today if patient is medically ready.  Will await MD to determine if medically ready.  Call me for questions.  #[phone]  I have attending MD clearance for admit to inpatient rehab at [facility].  I have called for transport.  Room number will be 4W26 and please have nurse call report to [phone].
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
## 2021-12-25 ENCOUNTER — Inpatient Hospital Stay (HOSPITAL_COMMUNITY): Payer: Medicaid Other

## 2021-12-25 ENCOUNTER — Ambulatory Visit (HOSPITAL_COMMUNITY): Payer: Medicaid Other | Attending: Physician Assistant

## 2021-12-25 DIAGNOSIS — M7989 Other specified soft tissue disorders: Secondary | ICD-10-CM | POA: Diagnosis not present

## 2021-12-25 LAB — COMPREHENSIVE METABOLIC PANEL
ALT: 13 U/L (ref 0–44)
AST: 18 U/L (ref 15–41)
Albumin: 2.8 g/dL — ABNORMAL LOW (ref 3.5–5.0)
Alkaline Phosphatase: 59 U/L (ref 38–126)
Anion gap: 6 (ref 5–15)
BUN: 5 mg/dL — ABNORMAL LOW (ref 8–23)
CO2: 25 mmol/L (ref 22–32)
Calcium: 8.9 mg/dL (ref 8.9–10.3)
Chloride: 107 mmol/L (ref 98–111)
Creatinine, Ser: 0.63 mg/dL (ref 0.44–1.00)
GFR, Estimated: >60 mL/min (ref >60)
Glucose, Bld: 133 mg/dL — ABNORMAL HIGH (ref 70–99)
Potassium: 3.6 mmol/L (ref 3.5–5.1)
Sodium: 138 mmol/L (ref 135–145)
Total Bilirubin: 0.8 mg/dL (ref 0.3–1.2)
Total Protein: 5.3 g/dL — ABNORMAL LOW (ref 6.5–8.1)

## 2021-12-25 LAB — CBC WITH DIFFERENTIAL/PLATELET
Abs Immature Granulocytes: 0.04 10*3/uL (ref 0.00–0.07)
Basophils Absolute: 0.1 10*3/uL (ref 0.0–0.1)
Basophils Relative: 1 %
Eosinophils Absolute: 0.6 10*3/uL — ABNORMAL HIGH (ref 0.0–0.5)
Eosinophils Relative: 7 %
HCT: 28 % — ABNORMAL LOW (ref 36.0–46.0)
Hemoglobin: 9.1 g/dL — ABNORMAL LOW (ref 12.0–15.0)
Immature Granulocytes: 0 %
Lymphocytes Relative: 31 %
Lymphs Abs: 2.8 10*3/uL (ref 0.7–4.0)
MCH: 29.8 pg (ref 26.0–34.0)
MCHC: 32.5 g/dL (ref 30.0–36.0)
MCV: 91.8 fL (ref 80.0–100.0)
Monocytes Absolute: 0.9 10*3/uL (ref 0.1–1.0)
Monocytes Relative: 10 %
Neutro Abs: 4.6 10*3/uL (ref 1.7–7.7)
Neutrophils Relative %: 51 %
Platelets: 339 10*3/uL (ref 150–400)
RBC: 3.05 MIL/uL — ABNORMAL LOW (ref 3.87–5.11)
RDW: 12.9 % (ref 11.5–15.5)
WBC: 8.9 10*3/uL (ref 4.0–10.5)
nRBC: 0 % (ref 0.0–0.2)

## 2021-12-25 MED ORDER — PANTOPRAZOLE SODIUM 40 MG PO TBEC
40.0000 mg | DELAYED_RELEASE_TABLET | Freq: Two times a day (BID) | ORAL | Status: DC
  Administered 2021-12-25 – 2021-12-31 (×12): 40 mg via ORAL
  Filled 2021-12-25 (×12): qty 1

## 2021-12-25 NOTE — Progress Notes (Signed)
Bilateral lower extremity venous duplex has been completed. Preliminary results can be found in CV Proc through chart review.   12/25/21 4:27 PM Olen Cordial RVT

## 2021-12-25 NOTE — IPOC Note (Signed)
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     Functional Problem List: Nursing Bowel, Endurance, Medication Management, Pain, Safety  PT Balance, Edema, Endurance, Pain, Skin Integrity  OT Balance, Pain  SLP    TR         Basic ADL's: OT Grooming, Bathing, Dressing, Toileting     Advanced  ADL's: OT Simple Meal Preparation, Light Housekeeping     Transfers: PT Bed Mobility, Bed to Chair, Car, Lobbyist, Technical brewer: PT Ambulation, Psychologist, prison and probation services, Stairs     Additional Impairments: OT None  SLP        TR      Anticipated Outcomes Item Anticipated Outcome  Self Feeding    Swallowing      Basic self-care  Mod I  Toileting  Mod I   Bathroom Transfers Mod I  Bowel/Bladder  manage bowel w mod I assist  Transfers  Mod I with LRAD  Locomotion  Ambulatory house hold distances mod I with LRAD  Communication     Cognition     Pain  < 4 with prns  Safety/Judgment  manage w cues   Therapy Plan: PT Intensity: Minimum of 1-2 x/day ,45 to 90 minutes PT Frequency: 5 out of 7 days PT Duration Estimated Length of Stay: 7-10 days OT Intensity: Minimum of 1-2 x/day, 45 to 90 minutes OT Frequency: 5 out of 7 days OT Duration/Estimated Length of Stay: 5-7 days     Team Interventions: Nursing Interventions Bowel Management, Medication Management, Pain Management, Discharge Planning, Disease Management/Prevention, Patient/Family Education  PT interventions Ambulation/gait training, Discharge planning, Functional mobility training, Psychosocial support, Therapeutic Activities, Wheelchair propulsion/positioning, Therapeutic Exercise, Skin care/wound management, Neuromuscular re-education, Disease  management/prevention, Warden/ranger, Cognitive remediation/compensation, DME/adaptive equipment instruction, Pain management, Splinting/orthotics, UE/LE Strength taining/ROM, Firefighter, Equities trader education, Museum/gallery curator, UE/LE Coordination activities  OT Interventions Warden/ranger, Patient/family education, Therapeutic Activities, UE/LE Strength taining/ROM, Self Care/advanced ADL retraining, Functional mobility training, Psychosocial support, Therapeutic Exercise, Discharge planning, Pain management  SLP Interventions    TR Interventions    SW/CM Interventions Discharge Planning, Psychosocial Support, Patient/Family Education, Disease Management/Prevention   Barriers to Discharge MD  Medical stability  Nursing Lack of/limited family support, Home environment access/layout 1 level 3 ste bil rails solo; dtr to assist prn  PT Inaccessible home environment, Decreased caregiver support, Home environment access/layout, Lack of/limited family support, Community education officer for SNF coverage    OT      SLP      SW Insurance for SNF coverage, Lack of/limited family support, Decreased caregiver support     Team Discharge Planning: Destination: PT-Home ,OT- Home , SLP-  Projected Follow-up: PT-Home health PT, OT-  Home health OT, SLP-  Projected Equipment Needs: PT-Wheelchair (measurements), Wheelchair cushion (measurements), OT- Tub/shower bench, 3 in 1 bedside comode, SLP-  Equipment Details: PT- , OT-  Patient/family involved in discharge planning: PT- Patient,  OT-Patient, SLP-   MD ELOS: 7-10d Medical Rehab Prognosis:  Good Assessment: The patient has been admitted for CIR therapies with the diagnosis of periprosthetic fracture . The team will be addressing functional mobility, strength, stamina, balance, safety, adaptive techniques and equipment, self-care, bowel and bladder mgt, patient and caregiver education, wound healing , weight bearing restriction  LLE. Goals have been set at [facility]  I. Anticipated discharge destination is Home .        See Team Conference Notes for weekly updates to the plan of care
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
## 2021-12-25 NOTE — Progress Notes (Signed)
Physical Therapy Session Note  Patient Details  Name: Meghan Bowman MRN: [id number] Date of Birth: 05/15/1960  Today's Date: 12/25/2021 PT Individual Time: 7939 - 1502 PT Individual Time Calculation (min): 77 min     Short Term Goals: Week 1:  PT Short Term Goal 1 (Week 1): STG=LTG due to ELOS  Skilled Therapeutic Interventions/Progress Updates:    Pt semi reclined in bed on entrance. Pt alert and agreeable for treatment session. Pt reports 6/10 pain but agreeable with participating in therapy session. Pt transported to therapy gym for time management and energy conservation.  Therapeutic Activity:  Transfers: Pt performed supine to sit at EOB and sit to stand using RW with close supervision. Pt demonstrated sit<>stand, stand pivot, and ambulatory transfers using RW with close supervision. Required verbal and tactile cuing for positioning to improve safety while adhering to weight precautions. Pt demonstrated  transfer to car simulation from driver side using RW with CGA and cuing for foot positioning  LE Strengthening:  The following exercises were performed left side only with Pt right side lying with lumbar support. Provided verbal and tactile cuing for sustain muscle contraction and maintain correct form. Leg raise (2x10) Hip extension (2x10) Hip circumduction (2x10)  Balance:  Completed 5TST X3 from the wheelchair with an average time of 23.7 seconds with close supervision and tug with a time of 55.6 seconds from wheelchair and using RW with close supervision to [place]. Pt required verbal cuing for activity retention and adhering to weight bearing precautions.  Pt participated in functional Wii activity and demonstrated split stance with left foot forward and left hand on RW with CGA. Pt tolerated 2 min 30 sec x4 before requiring rest break  Wheelchair: Pt able to self propel wheelchair 100 ft with close supervision. Required no verbal assistance. Able to handle breaks but  requires total assist for managing foot rest set up.  Pt fatigues quickly and requires frequent rest breaks during treatment session. Pt needed consistent reinforcement of weight bearing precautions throughout session as she executed functional activities.   Pt transported back to room and performed stand pivot transfer to bed using RW at close supervision.    Pt Left supine  in bed with alarm on, call/bell close by and all needs met.   Therapy Documentation Precautions:  Precautions Precautions: Fall Required Braces or Orthoses: Other Brace Other Brace: Bledsoe Brace 0 to 30 degrees at all tmes Restrictions Weight Bearing Restrictions: Yes LLE Weight Bearing: Touchdown weight bearing General:   Vital Signs: Therapy Vitals Temp: 98.3 F (36.8 C) Temp Source: Oral Pulse Rate: 71 Resp: 18 BP: 132/77 Patient Position (if appropriate): Lying Oxygen Therapy SpO2: 98 % O2 Device: Room Air Pain: Pain Assessment Pain Scale: 0-10 Pain Score: 6  Faces Pain Scale: Hurts a little bit Pain Type: Acute pain Pain Location: Leg Pain Orientation: Left Pain Descriptors / Indicators: Burning;Aching Pain Onset: On-going Patients Stated Pain Goal: 2 Multiple Pain Sites: No   Therapy/Group: Individual Therapy  Hilary Hertz PT, SPT   Hilary Hertz 12/25/2021, 4:38 PM

## 2021-12-25 NOTE — Plan of Care (Signed)
  Problem: RH Balance Goal: LTG Patient will maintain dynamic standing balance (PT) Description: LTG:  Patient will maintain dynamic standing balance with assistance during mobility activities (PT) Flowsheets (Taken 12/25/2021 1423) LTG: Pt will maintain dynamic standing balance during mobility activities with:: Independent with assistive device    Problem: RH Bed Mobility Goal: LTG Patient will perform bed mobility with assist (PT) Description: LTG: Patient will perform bed mobility with assistance, with/without cues (PT). Flowsheets (Taken 12/25/2021 1423) LTG: Pt will perform bed mobility with assistance level of: Independent with assistive device    Problem: RH Bed to Chair Transfers Goal: LTG Patient will perform bed/chair transfers w/assist (PT) Description: LTG: Patient will perform bed to chair transfers with assistance (PT). Flowsheets (Taken 12/25/2021 1423) LTG: Pt will perform Bed to Chair Transfers with assistance level: Independent with assistive device    Problem: RH Furniture Transfers Goal: LTG Patient will perform furniture transfers w/assist (OT/PT) Description: LTG: Patient will perform furniture transfers  with assistance (OT/PT). Flowsheets (Taken 12/25/2021 1423) LTG: Pt will perform furniture transfers with assist:: Independent with assistive device    Problem: RH Ambulation Goal: LTG Patient will ambulate in controlled environment (PT) Description: LTG: Patient will ambulate in a controlled environment, # of feet with assistance (PT). Flowsheets (Taken 12/25/2021 1423) LTG: Pt will ambulate in controlled environ  assist needed:: Independent with assistive device LTG: Ambulation distance in controlled environment: 172ft with LRAD Goal: LTG Patient will ambulate in home environment (PT) Description: LTG: Patient will ambulate in home environment, # of feet with assistance (PT). Flowsheets (Taken 12/25/2021 1423) LTG: Pt will ambulate in home environ  assist needed::  Independent with assistive device LTG: Ambulation distance in home environment: 32ft with LRAD   Problem: RH Wheelchair Mobility Goal: LTG Patient will propel w/c in controlled environment (PT) Description: LTG: Patient will propel wheelchair in controlled environment, # of feet with assist (PT) Flowsheets (Taken 12/25/2021 1423) LTG: Pt will propel w/c in controlled environ  assist needed:: Independent with assistive device LTG: Propel w/c distance in controlled environment: 179ft   Problem: RH Stairs Goal: LTG Patient will ambulate up and down stairs w/assist (PT) Description: LTG: Patient will ambulate up and down # of stairs with assistance (PT) Flowsheets (Taken 12/25/2021 1423) LTG: Pt will ambulate up/down stairs assist needed:: Contact Guard/Touching assist LTG: Pt will  ambulate up and down number of stairs: 3 steps with 2 rails for support
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
## 2021-12-25 NOTE — Progress Notes (Signed)
PROGRESS NOTE   Subjective/Complaints:  C/o Left elbow pain since fall Also worsened reflux, take protonix at home    ROS- denies CP, or breathing issues , no vomiting , no constipation , hx chronic diarrhea Objective:   DG FEMUR PORT MIN 2 VIEWS LEFT  Result Date: 12/24/2021 CLINICAL DATA:  Postop EXAM: LEFT FEMUR PORTABLE 4 VIEWS COMPARISON:  12/16/2021 FINDINGS: ORIF of distal femoral fracture and postop changes total knee arthroplasty again noted. Grossly anatomic alignment. No change compared to prior study. IMPRESSION: Postop changes. Electronically Signed   By: Layla Maw M.D.   On: 12/24/2021 08:34   Recent Labs    12/23/21 0448 12/25/21 0605  WBC 8.8 8.9  HGB 8.8* 9.1*  HCT 27.5* 28.0*  PLT 298 339   Recent Labs    12/23/21 0448 12/25/21 0605  NA 141 138  K 3.6 3.6  CL 105 107  CO2 30 25  GLUCOSE 116* 133*  BUN 6* 5*  CREATININE 0.64 0.63  CALCIUM 8.5* 8.9    Intake/Output Summary (Last 24 hours) at 12/25/2021 0905 Last data filed at 12/24/2021 1506 Gross per 24 hour  Intake 236 ml  Output --  Net 236 ml        Physical Exam: Vital Signs Blood pressure (!) 142/85, pulse 72, temperature 98 F (36.7 C), temperature source Oral, resp. rate 18, height 5\' 5"  (1.651 m), weight 95.9 kg, SpO2 97 %.  General: No acute distress Mood and affect are appropriate Heart: Regular rate and rhythm no rubs murmurs or extra sounds Lungs: Clear to auscultation, breathing unlabored, no rales or wheezes Abdomen: Positive bowel sounds, soft nontender to palpation, nondistended Extremities: No clubbing, cyanosis, or edema Skin: No evidence of breakdown, no evidence of rash Neurologic: Cranial nerves II through XII intact, motor strength is 5/5 in bilateral deltoid, bicep, tricep, grip, RIght hip flexor, knee extensors, Bilat ankle dorsiflexor and plantar flexor Left HF 4-, KE at least antigravity but limited  by Bledsoe brace  Sensation to LT intact B feet Musculoskeletal: Full range of motion in all 4 extremities. No joint swelling, +pain over left lateral epicondyle , no bruising     Assessment/Plan: 1. Functional deficits which require 3+ hours per day of interdisciplinary therapy in a comprehensive inpatient rehab setting. Physiatrist is providing close team supervision and 24 hour management of active medical problems listed below. Physiatrist and rehab team continue to assess barriers to discharge/monitor patient progress toward functional and medical goals  Care Tool:  Bathing              Bathing assist       Upper Body Dressing/Undressing Upper body dressing        Upper body assist      Lower Body Dressing/Undressing Lower body dressing            Lower body assist       Toileting Toileting    Toileting assist       Transfers Chair/bed transfer  Transfers assist           Locomotion Ambulation   Ambulation assist  Walk 10 feet activity   Assist           Walk 50 feet activity   Assist           Walk 150 feet activity   Assist           Walk 10 feet on uneven surface  activity   Assist           Wheelchair     Assist               Wheelchair 50 feet with 2 turns activity    Assist            Wheelchair 150 feet activity     Assist          Blood pressure (!) 142/85, pulse 72, temperature 98 F (36.7 C), temperature source Oral, resp. rate 18, height 5\' 5"  (1.651 m), weight 95.9 kg, SpO2 97 %.  Medical Problem List and Plan: 1. Functional deficits secondary to periprosthetic left distal femur fracture.S/P ORIF distal femur fracture 11/22. TDWB x 6 weeks with Bledsoe brace 0-30 degrees at all times             -patient may shower with bledsoe removed and leg carefully supported. Bledsoe can be reapplied once leg is dried off             -ELOS/Goals: 7-10 days,  mod I goals 2.  Antithrombotics: -DVT/anticoagulation:  Pharmaceutical: Eliquis x 4 weeks .Check vascular study             -antiplatelet therapy: N/A 3. Pain Management: Hydrocodone and Robaxin as needed             -Ice prn for pain/spasms Left lateral elbow pain , exam consistent with epicondylitis but given fall will check xray 4. Mood/Behavior/Sleep: Zoloft 100 mg daily, melatonin as needed.  Provide emotional support             -antipsychotic agents: N/A 5. Neuropsych/cognition: This patient is capable of making decisions on her own behalf. 6. Skin/Wound Care: Routine skin checks 7. Fluids/Electrolytes/Nutrition: Routine in and outs with follow-up chemistries on admit 8.Acute blood loss anemia.Follow up CBC             -cbc 8.8 11/29 9.  Hypertension.  Toprol-XL 25 mg daily.  Monitor with increased mobility             -bp well controlled 10.  Hyperlipidemia.  Lipitor 11.  Tobacco abuse.  Provide counseling 12.  Constipation.  MiraLAX daily             -pt says she's had recent bowel movement. I don't see one recorded.              -observe for now 13.  History of asthma.  Resume albuterol inhaler as needed  14.Obesity.BMI 35.95 15.  GERD.  Protonix will increase to BID    LOS: 1 days A FACE TO FACE EVALUATION WAS PERFORMED  12/29 12/25/2021, 9:05 AM

## 2021-12-25 NOTE — Plan of Care (Signed)
  Problem: RH Balance Goal: LTG Patient will maintain dynamic standing with ADLs (OT) Description: LTG:  Patient will maintain dynamic standing balance with assist during activities of daily living (OT)  Flowsheets (Taken 12/25/2021 1214) LTG: Pt will maintain dynamic standing balance during ADLs with: Independent with assistive device   Problem: Sit to Stand Goal: LTG:  Patient will perform sit to stand in prep for activites of daily living with assistance level (OT) Description: LTG:  Patient will perform sit to stand in prep for activites of daily living with assistance level (OT) Flowsheets (Taken 12/25/2021 1214) LTG: PT will perform sit to stand in prep for activites of daily living with assistance level: Independent with assistive device   Problem: RH Grooming Goal: LTG Patient will perform grooming w/assist,cues/equip (OT) Description: LTG: Patient will perform grooming with assist, with/without cues using equipment (OT) Flowsheets (Taken 12/25/2021 1214) LTG: Pt will perform grooming with assistance level of: Independent with assistive device    Problem: RH Bathing Goal: LTG Patient will bathe all body parts with assist levels (OT) Description: LTG: Patient will bathe all body parts with assist levels (OT) Flowsheets (Taken 12/25/2021 1214) LTG: Pt will perform bathing with assistance level/cueing: Independent with assistive device  LTG: Position pt will perform bathing: Shower   Problem: RH Dressing Goal: LTG Patient will perform upper body dressing (OT) Description: LTG Patient will perform upper body dressing with assist, with/without cues (OT). Flowsheets (Taken 12/25/2021 1214) LTG: Pt will perform upper body dressing with assistance level of: Independent with assistive device Goal: LTG Patient will perform lower body dressing w/assist (OT) Description: LTG: Patient will perform lower body dressing with assist, with/without cues in positioning using equipment (OT) Flowsheets  (Taken 12/25/2021 1214) LTG: Pt will perform lower body dressing with assistance level of: Independent with assistive device   Problem: RH Toileting Goal: LTG Patient will perform toileting task (3/3 steps) with assistance level (OT) Description: LTG: Patient will perform toileting task (3/3 steps) with assistance level (OT)  Flowsheets (Taken 12/25/2021 1214) LTG: Pt will perform toileting task (3/3 steps) with assistance level: Independent with assistive device   Problem: RH Simple Meal Prep Goal: LTG Patient will perform simple meal prep w/assist (OT) Description: LTG: Patient will perform simple meal prep with assistance, with/without cues (OT). Flowsheets (Taken 12/25/2021 1214) LTG: Pt will perform simple meal prep with assistance level of: Independent with assistive device LTG: Pt will perform simple meal prep w/level of: Wheelchair level   Problem: RH Light Housekeeping Goal: LTG Patient will perform light housekeeping w/assist (OT) Description: LTG: Patient will perform light housekeeping with assistance, with/without cues (OT). Flowsheets (Taken 12/25/2021 1214) LTG: Pt will perform light housekeeping with assistance level of: Independent with assistive device LTG: Pt will perform light housekeeping w/level of: Wheelchair level   Problem: RH Toilet Transfers Goal: LTG Patient will perform toilet transfers w/assist (OT) Description: LTG: Patient will perform toilet transfers with assist, with/without cues using equipment (OT) Flowsheets (Taken 12/25/2021 1214) LTG: Pt will perform toilet transfers with assistance level of: Independent with assistive device   Problem: RH Tub/Shower Transfers Goal: LTG Patient will perform tub/shower transfers w/assist (OT) Description: LTG: Patient will perform tub/shower transfers with assist, with/without cues using equipment (OT) Flowsheets (Taken 12/25/2021 1214) LTG: Pt will perform tub/shower stall transfers with assistance level of:  Independent with assistive device LTG: Pt will perform tub/shower transfers from: Tub/shower combination

## 2021-12-25 NOTE — Progress Notes (Signed)
Inpatient Rehabilitation Center Individual Statement of Services  Patient Name:  Meghan Bowman  Date:  12/25/2021  Welcome to the Inpatient Rehabilitation Center.  Our goal is to provide you with an individualized program based on your diagnosis and situation, designed to meet your specific needs.  With this comprehensive rehabilitation program, you will be expected to participate in at least 3 hours of rehabilitation therapies Monday-Friday, with modified therapy programming on the weekends.  Your rehabilitation program will include the following services:  Physical Therapy (PT), Occupational Therapy (OT), Speech Therapy (ST), 24 hour per day rehabilitation nursing, Therapeutic Recreaction (TR), Neuropsychology, Care Coordinator, Rehabilitation Medicine, Nutrition Services, Pharmacy Services, and Other  Weekly team conferences will be held on Wednesdays to discuss your progress.  Your Inpatient Rehabilitation Care Coordinator will talk with you frequently to get your input and to update you on team discussions.  Team conferences with you and your family in attendance may also be held.  Expected length of stay: 7-10 Days  Overall anticipated outcome: Mod I   Depending on your progress and recovery, your program may change. Your Inpatient Rehabilitation Care Coordinator will coordinate services and will keep you informed of any changes. Your Inpatient Rehabilitation Care Coordinator's name and contact numbers are listed  below.  The following services may also be recommended but are not provided by the Inpatient Rehabilitation Center:   Home Health Rehabiltiation Services Outpatient Rehabilitation Services    Arrangements will be made to provide these services after discharge if needed.  Arrangements include referral to agencies that provide these services.  Your insurance has been verified to be:  [facility] MEDICAID Your primary doctor is:  Knox Royalty, MD  Pertinent information will  be shared with your doctor and your insurance company.  Inpatient Rehabilitation Care Coordinator:  Lavera Guise, [place] [phone] or [phone]  Information discussed with and copy given to patient by: Andria Rhein, 12/25/2021, 11:11 AM

## 2021-12-25 NOTE — Progress Notes (Signed)
Inpatient Rehabilitation  Patient information reviewed and entered into eRehab system by Lalo Tromp M. Shaheed Schmuck, M.A., CCC/SLP, PPS Coordinator.  Information including medical coding, functional ability and quality indicators will be reviewed and updated through discharge.    

## 2021-12-26 DIAGNOSIS — R3915 Urgency of urination: Secondary | ICD-10-CM

## 2021-12-26 LAB — URINALYSIS, ROUTINE W REFLEX MICROSCOPIC
Bilirubin Urine: NEGATIVE
Glucose, UA: NEGATIVE mg/dL
Hgb urine dipstick: NEGATIVE
Ketones, ur: NEGATIVE mg/dL
Leukocytes,Ua: NEGATIVE
Nitrite: NEGATIVE
Protein, ur: NEGATIVE mg/dL
Specific Gravity, Urine: 1.013 (ref 1.005–1.030)
pH: 7 (ref 5.0–8.0)

## 2021-12-26 MED ORDER — SENNOSIDES-DOCUSATE SODIUM 8.6-50 MG PO TABS
2.0000 | ORAL_TABLET | Freq: Every day | ORAL | Status: DC
  Administered 2021-12-26 – 2021-12-30 (×5): 2 via ORAL
  Filled 2021-12-26 (×5): qty 2

## 2021-12-26 MED ORDER — MIRABEGRON ER 25 MG PO TB24
25.0000 mg | ORAL_TABLET | Freq: Every day | ORAL | Status: DC
  Administered 2021-12-26 – 2021-12-31 (×6): 25 mg via ORAL
  Filled 2021-12-26 (×7): qty 1

## 2021-12-26 NOTE — Progress Notes (Signed)
Physical Therapy Session Note  Patient Details  Name: Meghan Bowman MRN: [id number] Date of Birth: [birth year]  Today's Date: 12/26/2021 PT Individual Time: 1536-1630 PT Individual Time Calculation (min): 54 min   Short Term Goals: Week 1:  PT Short Term Goal 1 (Week 1): STG=LTG due to ELOS   Skilled Therapeutic Interventions/Progress Updates:   Pt received supine in bed and agreeable to PT. Supine>sit transfer with supervision assist and cues for LLE position once EOB. Stand pivot transfer to [facility] with supervision assist and cues for TDWB through transfer.   Pt transported to rehab gym in [facility]. Gait training with RW x 28f with supervision assist and min cues initially for TDWB. PT noted Bledsoe posterior/proximal brace straps loose. PT performed adjustment to strap and noted brace locked at 0-60 deg. PT adjusted brace to ensure brace locked 0-30deg. [facility]. [facility] Gait training with RW through gift shop x 2018fwith supervision assist and cues for TDWB while reaching for objects on shelf.   WC mobility through hospital atrium and over cement sidewalk x [id number] cues for direction and management of unlevel surface on sidewalk.   Pt returned to room and performed stand pivot transfer to bed with RW and supervision assist from PT for safety. Sit>supine completed with cues for LLE position on bed. Pt and left supine in bed with call bell in reach and all needs met.        Therapy Documentation Precautions:  Precautions Precautions: Fall Required Braces or Orthoses: Other Brace Other Brace: Bledsoe Brace 0 to 30 degrees at all tmes Restrictions Weight Bearing Restrictions: Yes LLE Weight Bearing: Touchdown weight bearing  Vital Signs: Therapy Vitals Temp: 98.6 F (37 C) Temp Source: Oral Pulse Rate: 76 Resp: 15 BP: (!) 141/68 Patient Position (if appropriate): Lying Oxygen  Therapy SpO2: 98 % O2 Device: Room Air Pain: Pain Assessment Pain Scale: 0-10 Pain Score: 7  Faces Pain Scale: Hurts a little bit Pain Type: Acute pain Pain Location: Leg Pain Orientation: Left Pain Descriptors / Indicators: Aching Pain Onset: Gradual Patients Stated Pain Goal: 2 Pain Intervention(s): Medication (See eMAR) Multiple Pain Sites: No  Therapy/Group: Individual Therapy  AustLorie Phenix2/2023, 4:31 PM

## 2021-12-26 NOTE — Progress Notes (Signed)
Physical Therapy Session Note  Patient Details  Name: Meghan Bowman MRN: [id number] Date of Birth: [birth year]  Today's Date: 12/26/2021 PT Individual Time:  1117- 12:05 PT Individual Time Calculation: 48 min     Short Term Goals: Week 1:  PT Short Term Goal 1 (Week 1): STG=LTG due to ELOS  Skilled Therapeutic Interventions/Progress Updates:   Pt supine in bed upon entrance. Pt alert but semi resistant to treatment session. Provided pt education about current condition and encouragement towards improved recovery. Pt agreeable to participate in therapy session in Pt room only.  Therapeutic Activity:  Session was completed with emphasis on improving  functional mobility/transfers, dynamic sitting/standing/balance, coordination, and generalized strengthening.  Transfers: Pt performed supine roll to right side lying and right side lying to sitting at EOB with close supervision. Pt demonstrated sit<> stand, stand pivot, and ambulatory transfers using RW with close supervision. Required verbal and tactile cuing for positioning to improve safety along with  reminder to adhere to weight bearing precautions.  LE Strengthening exercise  Pt right side lying straight leg raises (3 x 10) and straight leg hip extensions  (3 x 10) ( left side only) Provided verbal and tactile cuing for sustained muscle contraction and maintaining correct form.  Balance: Completed 5TST x3 from EOB using RW with close supervision. Cued for correct form, forward trunk lean, slow descent, and adherence to weight bearing precautions during sit<>stand transitions. Pt completed bicep curls (1 x 10) and Rows (1 x 10) seated at edge of bed with 5 Ib weighted bar. Pt mentioned having left elbow discomfort that increased during activity. Reviewed patient history and provided pt education about current condition before discontinuing activity. Performed seated torso twist (3 x 5 ea) w/ 4#medicine ball. Cued for maintaining erect  posture,reducing postural sway, and maintain accurate hand placement on ball.  Pt performed close pin activity x 3 while standing with left hand on RW and split stance with left foot forward. Pt was able to remove, blue (8), green (8), and black (8) close pins. Pt completed functional activity with an average time of 55 seconds, requiring occasional cuing to adhere to weight bearing precautions.  Gait: Ambulated from EOB closest to window, to door and back to EOB adjacent to bathroom using RW with TTW on left side with close supervision. Demonstrated decrease gait speed, with step to gait pattern.  Pt left supine with alarm on, call/bell in reach and all needs met.     Therapy Documentation Precautions:  Precautions Precautions: Fall Required Braces or Orthoses: Other Brace Other Brace: Bledsoe Brace 0 to 30 degrees at all tmes Restrictions Weight Bearing Restrictions: Yes LLE Weight Bearing: Touchdown weight bearing  Pain: Pain Assessment Pain Scale: 0-10 Pain Score: 7  Faces Pain Scale: Hurts a little bit Pain Type: Acute pain Pain Location: Leg Pain Orientation: Left Pain Descriptors / Indicators: Aching Pain Onset: Gradual Patients Stated Pain Goal: 2 Pain Intervention(s): Medication (See eMAR) Multiple Pain Sites: No  Therapy/Group: Individual Therapy  Hilary Hertz PT, SPT  Hilary Hertz 12/26/2021, 12:44 PM

## 2021-12-26 NOTE — Progress Notes (Signed)
Physical Therapy Session Note  Patient Details  Name: Meghan Bowman MRN: [id number] Date of Birth: 09/15/1960  Today's Date: 12/26/2021 PT Missed Time: 45 Minutes Missed Time Reason: Patient ill (Comment);Patient fatigue;Patient unwilling to participate;Pain (c/o back pain, nausea, fatigue, and requests to hold therapy this morning)  Short Term Goals: Week 1:  PT Short Term Goal 1 (Week 1): STG=LTG due to ELOS  Skilled Therapeutic Interventions/Progress Updates:  Pt sidelying to R side in flexed position on entrance to room. Pt relates increased pain in back, nausea from pain, and urge incontinence requiring a brief. Relates that MD has ordered a UA in order to test for UTI. Asked if pt would like to participate in urination to collect urine and start process but pt reports just getting comfortable and not needing to urinate at present.   Pt missed of skilled therapy due to pain, nausea, fatigue. Will re-attempt as schedule and pt availability permits.   Therapy Documentation Precautions:  Precautions Precautions: Fall Required Braces or Orthoses: Other Brace Other Brace: Bledsoe Brace 0 to 30 degrees at all tmes Restrictions Weight Bearing Restrictions: Yes LLE Weight Bearing: Touchdown weight bearing General: PT Amount of Missed Time (min): 45 Minutes PT Missed Treatment Reason: Patient ill (Comment);Patient fatigue;Patient unwilling to participate;Pain (c/o back pain, nausea, fatigue, and requests to hold therapy this morning) Vital Signs:  Pain: Pain Assessment Pain Scale: 0-10 Pain Score: 8  Pain Intervention(s): Medication (See eMAR) from RN  Therapy/Group: Individual Therapy  Loel Dubonnet PT, DPT, CSRS 12/26/2021, 8:53 AM

## 2021-12-26 NOTE — Progress Notes (Signed)
Occupational Therapy Session Note  Patient Details  Name: Meghan Bowman MRN: [id number] Date of Birth: Jan 06, 1961  Today's Date: 12/26/2021 OT Individual Time:  - Patient missed 60 minute treatment due to c/o illness and fatigue      Short Term Goals: Week 1:  OT Short Term Goal 1 (Week 1): STG=LTG 2/2 ELOS  Skilled Therapeutic Interventions/Progress Updates:  patient c/o fatigue, feeling sick and extreme urge incontinence all of a sudden since yesterday and not able to participate in therapy this date.  She stated she had already pericleansed  and did not feel well enough to complete grooming or don pants, new gown, or other clothing.  Additionally, patient stated, "The doctor is going to order some tests to see what is going on."  This OT practitioner will offer therapy again at end of scheduled day to see if patient feels well enough to participate.  Patient missed 60 minute OT therapy this date.  Will offer next scheduled treatment date.      Therapy Documentation Precautions:  Precautions Precautions: Fall Required Braces or Orthoses: Other Brace Other Brace: Bledsoe Brace 0 to 30 degrees at all tmes Restrictions Weight Bearing Restrictions: Yes LLE Weight Bearing: Touchdown weight bearing   Pain: patient reported intermittent pain in various body areas but did not pin point any in particular.  RN gave pain meds upon patient request.    Therapy/Group: Individual Therapy  Bud Face [facility] 12/26/2021, 8:43 AM

## 2021-12-26 NOTE — Progress Notes (Signed)
PROGRESS NOTE   Subjective/Complaints:  C/o sudden onset urinary urgency and incontinence since mid day yesterday. Otherwise doing well with therapies  ROS: Patient denies fever, rash, sore throat, blurred vision, dizziness, nausea, vomiting, diarrhea, cough, shortness of breath or chest pain, joint or back/neck pain, headache, or mood change.    Objective:   VAS [place] LOWER EXTREMITY VENOUS (DVT)  Result Date: 12/25/2021  Lower Venous DVT Study Patient Name:  Meghan Bowman  Date of Exam:   12/25/2021 Medical Rec #: [id number]       Accession #:    [id number] Date of Birth: Dec 01, 1960      Patient Gender: F Patient Age:   61 years Exam Location:  [facility] Procedure:      VAS [place] LOWER EXTREMITY VENOUS (DVT) Referring Phys: Mariam Dollar --------------------------------------------------------------------------------  Indications: Swelling.  Risk Factors: None identified. Limitations: Poor ultrasound/tissue interface, bandages and open wound. Comparison Study: No prior studies. Performing Technologist: Chanda Busing RVT  Examination Guidelines: A complete evaluation includes B-mode imaging, spectral Doppler, color Doppler, and power Doppler as needed of all accessible portions of each vessel. Bilateral testing is considered an integral part of a complete examination. Limited examinations for reoccurring indications may be performed as noted. The reflux portion of the exam is performed with the patient in reverse Trendelenburg.  +---------+---------------+---------+-----------+----------+--------------+ RIGHT    CompressibilityPhasicitySpontaneityPropertiesThrombus Aging +---------+---------------+---------+-----------+----------+--------------+ CFV      Full           Yes      Yes                                 +---------+---------------+---------+-----------+----------+--------------+ SFJ      Full                                                         +---------+---------------+---------+-----------+----------+--------------+ FV Prox  Full                                                        +---------+---------------+---------+-----------+----------+--------------+ FV Mid   Full                                                        +---------+---------------+---------+-----------+----------+--------------+ FV DistalFull                                                        +---------+---------------+---------+-----------+----------+--------------+ PFV      Full                                                        +---------+---------------+---------+-----------+----------+--------------+  POP      Full           Yes      Yes                                 +---------+---------------+---------+-----------+----------+--------------+ PTV      Full                                                        +---------+---------------+---------+-----------+----------+--------------+ PERO     Full                                                        +---------+---------------+---------+-----------+----------+--------------+   +---------+---------------+---------+-----------+----------+--------------+ LEFT     CompressibilityPhasicitySpontaneityPropertiesThrombus Aging +---------+---------------+---------+-----------+----------+--------------+ CFV      Full           Yes      Yes                                 +---------+---------------+---------+-----------+----------+--------------+ SFJ      Full                                                        +---------+---------------+---------+-----------+----------+--------------+ FV Prox  Full                                                        +---------+---------------+---------+-----------+----------+--------------+ FV Mid                  Yes      Yes                                  +---------+---------------+---------+-----------+----------+--------------+ FV Distal               Yes      Yes                                 +---------+---------------+---------+-----------+----------+--------------+ PFV      Full                                                        +---------+---------------+---------+-----------+----------+--------------+ POP      Full           Yes      Yes                                 +---------+---------------+---------+-----------+----------+--------------+  PTV      Full                                                        +---------+---------------+---------+-----------+----------+--------------+ PERO     Full                                                        +---------+---------------+---------+-----------+----------+--------------+    Summary: RIGHT: - There is no evidence of deep vein thrombosis in the lower extremity.  - No cystic structure found in the popliteal fossa.  LEFT: - There is no evidence of deep vein thrombosis in the lower extremity. However, portions of this examination were limited- see technologist comments above.  - No cystic structure found in the popliteal fossa.  *See table(s) above for measurements and observations.    Preliminary    DG Elbow 2 Views Left  Result Date: 12/25/2021 CLINICAL DATA:  Left elbow pain. EXAM: LEFT ELBOW - 2 VIEW COMPARISON:  None available FINDINGS: Mild medial elbow joint space narrowing and peripheral trochlea/coronoid process degenerative spurring. Minimal degenerative spurring at the far medial aspect of the radial head articular surface. No definite elbow joint effusion. No acute fracture line is seen. No dislocation. IMPRESSION: Mild medial elbow osteoarthritis. No acute fracture is seen. Electronically Signed   By: Neita Garnetonald  Viola M.D.   On: 12/25/2021 10:28   Recent Labs    12/25/21 0605  WBC 8.9  HGB 9.1*  HCT 28.0*  PLT 339   Recent Labs     12/25/21 0605  NA 138  K 3.6  CL 107  CO2 25  GLUCOSE 133*  BUN 5*  CREATININE 0.63  CALCIUM 8.9    Intake/Output Summary (Last 24 hours) at 12/26/2021 1240 Last data filed at 12/25/2021 1900 Gross per 24 hour  Intake 240 ml  Output --  Net 240 ml        Physical Exam: Vital Signs Blood pressure (!) 142/84, pulse 82, temperature 97.7 F (36.5 C), temperature source Oral, resp. rate 18, height 5\' 5"  (1.651 m), weight 95.9 kg, SpO2 98 %.  Constitutional: No distress . Vital signs reviewed. HEENT: NCAT, EOMI, oral membranes moist Neck: supple Cardiovascular: RRR without murmur. No JVD    Respiratory/Chest: CTA Bilaterally without wheezes or rales. Normal effort    GI/Abdomen: BS +, non-tender, non-distended Ext: no clubbing, cyanosis, or edema Psych: pleasant and cooperative  Skin: No evidence of breakdown, no evidence of rash. Incision cdi Neurologic: Cranial nerves II through XII intact, motor strength is 5/5 in bilateral deltoid, bicep, tricep, grip, RIght hip flexor, knee extensors, Bilat ankle dorsiflexor and plantar flexor Left HF 4-, KE at least antigravity but limited by Bledsoe brace  Sensation to LT intact B feet Musculoskeletal: Full range of motion in all 4 extremities. Jt swelling near left knee.  +pain over left lateral epicondyle , no bruising     Assessment/Plan: 1. Functional deficits which require 3+ hours per day of interdisciplinary therapy in a comprehensive inpatient rehab setting. Physiatrist is providing close team supervision and 24 hour management of active medical problems listed below. Physiatrist and rehab team continue to assess barriers  to discharge/monitor patient progress toward functional and medical goals  Care Tool:  Bathing    Body parts bathed by patient: Right arm, Left arm, Chest, Abdomen, Front perineal area, Buttocks, Right upper leg, Right lower leg, Face     Body parts n/a: Left upper leg, Left lower leg (covered with  bandage and bag)   Bathing assist Assist Level: Contact Guard/Touching assist     Upper Body Dressing/Undressing Upper body dressing   What is the patient wearing?: Pull over shirt    Upper body assist Assist Level: Set up assist    Lower Body Dressing/Undressing Lower body dressing      What is the patient wearing?: Pants, Underwear/pull up, Orthosis     Lower body assist Assist for lower body dressing: Minimal Assistance - Patient > 75%     Toileting Toileting    Toileting assist Assist for toileting: Contact Guard/Touching assist     Transfers Chair/bed transfer  Transfers assist     Chair/bed transfer assist level: Contact Guard/Touching assist     Locomotion Ambulation   Ambulation assist      Assist level: Minimal Assistance - Patient > 75% Assistive device: Walker-rolling Max distance: 15   Walk 10 feet activity   Assist     Assist level: Minimal Assistance - Patient > 75% Assistive device: Walker-rolling   Walk 50 feet activity   Assist Walk 50 feet with 2 turns activity did not occur: Safety/medical concerns         Walk 150 feet activity   Assist Walk 150 feet activity did not occur: Safety/medical concerns         Walk 10 feet on uneven surface  activity   Assist Walk 10 feet on uneven surfaces activity did not occur: Safety/medical concerns         Wheelchair     Assist   Type of Wheelchair: Manual    Wheelchair assist level: Supervision/Verbal cueing Max wheelchair distance: 150    Wheelchair 50 feet with 2 turns activity    Assist        Assist Level: Supervision/Verbal cueing   Wheelchair 150 feet activity     Assist      Assist Level: Supervision/Verbal cueing   Blood pressure (!) 142/84, pulse 82, temperature 97.7 F (36.5 C), temperature source Oral, resp. rate 18, height  (1.651 m), weight 95.9 kg, SpO2 98 %.  Medical Problem List and Plan: 1. Functional deficits  secondary to periprosthetic left distal femur fracture.S/P ORIF distal femur fracture 11/22. TDWB x 6 weeks with Bledsoe brace 0-30 degrees at all times             -patient may shower with bledsoe removed and leg carefully supported. Bledsoe can be reapplied once leg is dried off             -ELOS/Goals: 7-10 days, mod I goals  -Continue CIR therapies including PT, OT  2.  Antithrombotics: -DVT/anticoagulation:  Pharmaceutical: Eliquis x 4 weeks .Check vascular study             -antiplatelet therapy: N/A 3. Pain Management: Hydrocodone and Robaxin as needed             -Ice prn for pain/spasms Left lateral elbow pain , exam consistent with epicondylitis but given fall will check xray  12/2 xray with arthritis. Pt didn't even mention elbow pain to me today 4. Mood/Behavior/Sleep: Zoloft 100 mg daily, melatonin as needed.  Provide emotional support             -  antipsychotic agents: N/A 5. Neuropsych/cognition: This patient is capable of making decisions on her own behalf. 6. Skin/Wound Care: Routine skin checks 7. Fluids/Electrolytes/Nutrition: Routine in and outs with follow-up chemistries on admit 8.Acute blood loss anemia.Follow up CBC             -hgb up to 9.1 12/1 9.  Hypertension.  Toprol-XL 25 mg daily.  Monitor with increased mobility             -bp well controlled 12/2 10.  Hyperlipidemia.  Lipitor 11.  Tobacco abuse.  Provide counseling 12.  Constipation.  MiraLAX daily             -pt says she's had recent bowel movement. I don't see one recorded.              -would like for her to have a bm this weekend. 13.  History of asthma.  Resume albuterol inhaler as needed  14.Obesity.BMI 35.95 15.  GERD.  Protonix will increase to BID 16. Urinary urgency and incontinence:  -sudden onset. Don't see any pharmaceutical cause  -UA is back and is clean. UCX pending  -will add myrbetriq and see how she does    LOS: 2 days A FACE TO FACE EVALUATION WAS PERFORMED  Ranelle Oyster 12/26/2021, 12:40 PM

## 2021-12-27 LAB — URINE CULTURE: Culture: <10000 — AB

## 2021-12-27 NOTE — Progress Notes (Signed)
PROGRESS NOTE   Subjective/Complaints:  No problems with therapy yesterday. Still some frequency but not as bad. Continent.   ROS: Patient denies fever, rash, sore throat, blurred vision, dizziness, nausea, vomiting, diarrhea, cough, shortness of breath or chest pain,   headache, or mood change.    Objective:   VAS [place] LOWER EXTREMITY VENOUS (DVT)  Result Date: 12/26/2021  Lower Venous DVT Study Patient Name:  Meghan Bowman  Date of Exam:   12/25/2021 Medical Rec #: [id number]       Accession #:    [id number] Date of Birth: November 18, 1960      Patient Gender: F Patient Age:   61 years Exam Location:  [facility] Procedure:      VAS [place] LOWER EXTREMITY VENOUS (DVT) Referring Phys: Lauraine Rinne --------------------------------------------------------------------------------  Indications: Swelling.  Risk Factors: None identified. Limitations: Poor ultrasound/tissue interface, bandages and open wound. Comparison Study: No prior studies. Performing Technologist: Oliver Hum RVT  Examination Guidelines: A complete evaluation includes B-mode imaging, spectral Doppler, color Doppler, and power Doppler as needed of all accessible portions of each vessel. Bilateral testing is considered an integral part of a complete examination. Limited examinations for reoccurring indications may be performed as noted. The reflux portion of the exam is performed with the patient in reverse Trendelenburg.  +---------+---------------+---------+-----------+----------+--------------+ RIGHT    CompressibilityPhasicitySpontaneityPropertiesThrombus Aging +---------+---------------+---------+-----------+----------+--------------+ CFV      Full           Yes      Yes                                 +---------+---------------+---------+-----------+----------+--------------+ SFJ      Full                                                         +---------+---------------+---------+-----------+----------+--------------+ FV Prox  Full                                                        +---------+---------------+---------+-----------+----------+--------------+ FV Mid   Full                                                        +---------+---------------+---------+-----------+----------+--------------+ FV DistalFull                                                        +---------+---------------+---------+-----------+----------+--------------+ PFV      Full                                                        +---------+---------------+---------+-----------+----------+--------------+  POP      Full           Yes      Yes                                 +---------+---------------+---------+-----------+----------+--------------+ PTV      Full                                                        +---------+---------------+---------+-----------+----------+--------------+ PERO     Full                                                        +---------+---------------+---------+-----------+----------+--------------+   +---------+---------------+---------+-----------+----------+--------------+ LEFT     CompressibilityPhasicitySpontaneityPropertiesThrombus Aging +---------+---------------+---------+-----------+----------+--------------+ CFV      Full           Yes      Yes                                 +---------+---------------+---------+-----------+----------+--------------+ SFJ      Full                                                        +---------+---------------+---------+-----------+----------+--------------+ FV Prox  Full                                                        +---------+---------------+---------+-----------+----------+--------------+ FV Mid                  Yes      Yes                                  +---------+---------------+---------+-----------+----------+--------------+ FV Distal               Yes      Yes                                 +---------+---------------+---------+-----------+----------+--------------+ PFV      Full                                                        +---------+---------------+---------+-----------+----------+--------------+ POP      Full           Yes      Yes                                 +---------+---------------+---------+-----------+----------+--------------+  PTV      Full                                                        +---------+---------------+---------+-----------+----------+--------------+ PERO     Full                                                        +---------+---------------+---------+-----------+----------+--------------+     Summary: RIGHT: - There is no evidence of deep vein thrombosis in the lower extremity.  - No cystic structure found in the popliteal fossa.  LEFT: - There is no evidence of deep vein thrombosis in the lower extremity. However, portions of this examination were limited- see technologist comments above.  - No cystic structure found in the popliteal fossa.  *See table(s) above for measurements and observations. Electronically signed by Orlie Pollen on 12/26/2021 at 1:26:20 PM.    Final    DG Elbow 2 Views Left  Result Date: 12/25/2021 CLINICAL DATA:  Left elbow pain. EXAM: LEFT ELBOW - 2 VIEW COMPARISON:  None available FINDINGS: Mild medial elbow joint space narrowing and peripheral trochlea/coronoid process degenerative spurring. Minimal degenerative spurring at the far medial aspect of the radial head articular surface. No definite elbow joint effusion. No acute fracture line is seen. No dislocation. IMPRESSION: Mild medial elbow osteoarthritis. No acute fracture is seen. Electronically Signed   By: Yvonne Kendall M.D.   On: 12/25/2021 10:28   Recent Labs    12/25/21 0605  WBC 8.9   HGB 9.1*  HCT 28.0*  PLT 339   Recent Labs    12/25/21 0605  NA 138  K 3.6  CL 107  CO2 25  GLUCOSE 133*  BUN 5*  CREATININE 0.63  CALCIUM 8.9    Intake/Output Summary (Last 24 hours) at 12/27/2021 0819 Last data filed at 12/26/2021 1900 Gross per 24 hour  Intake 356 ml  Output --  Net 356 ml        Physical Exam: Vital Signs Blood pressure 121/68, pulse 62, temperature 98.5 F (36.9 C), resp. rate 18, height 5\' 5"  (1.651 m), weight 95.9 kg, SpO2 98 %.  Constitutional: No distress . Vital signs reviewed. HEENT: NCAT, EOMI, oral membranes moist Neck: supple Cardiovascular: RRR without murmur. No JVD    Respiratory/Chest: CTA Bilaterally without wheezes or rales. Normal effort    GI/Abdomen: BS +, non-tender, non-distended Ext: no clubbing, cyanosis, or edema Psych: pleasant and cooperative  Skin: No evidence of breakdown, no evidence of rash. Incision cdi Neurologic: Cranial nerves II through XII intact, motor strength is 5/5 in bilateral deltoid, bicep, tricep, grip, RIght hip flexor, knee extensors, Bilat ankle dorsiflexor and plantar flexor Left HF 4-, KE at least antigravity but limited by Bledsoe brace --stable Sensation to LT intact B feet Musculoskeletal: Full range of motion in all 4 extremities. Jt swelling near left knee.  +pain over left lateral epicondyle , no bruising     Assessment/Plan: 1. Functional deficits which require 3+ hours per day of interdisciplinary therapy in a comprehensive inpatient rehab setting. Physiatrist is providing close team supervision and 24 hour management of active medical problems listed below. Physiatrist  and rehab team continue to assess barriers to discharge/monitor patient progress toward functional and medical goals  Care Tool:  Bathing    Body parts bathed by patient: Right arm, Left arm, Chest, Abdomen, Front perineal area, Buttocks, Right upper leg, Right lower leg, Face     Body parts n/a: Left upper leg,  Left lower leg (covered with bandage and bag)   Bathing assist Assist Level: Contact Guard/Touching assist     Upper Body Dressing/Undressing Upper body dressing   What is the patient wearing?: Pull over shirt    Upper body assist Assist Level: Set up assist    Lower Body Dressing/Undressing Lower body dressing      What is the patient wearing?: Pants, Underwear/pull up, Orthosis     Lower body assist Assist for lower body dressing: Minimal Assistance - Patient > 75%     Toileting Toileting    Toileting assist Assist for toileting: Contact Guard/Touching assist     Transfers Chair/bed transfer  Transfers assist     Chair/bed transfer assist level: Contact Guard/Touching assist     Locomotion Ambulation   Ambulation assist      Assist level: Minimal Assistance - Patient > 75% Assistive device: Walker-rolling Max distance: 15   Walk 10 feet activity   Assist     Assist level: Minimal Assistance - Patient > 75% Assistive device: Walker-rolling   Walk 50 feet activity   Assist Walk 50 feet with 2 turns activity did not occur: Safety/medical concerns         Walk 150 feet activity   Assist Walk 150 feet activity did not occur: Safety/medical concerns         Walk 10 feet on uneven surface  activity   Assist Walk 10 feet on uneven surfaces activity did not occur: Safety/medical concerns         Wheelchair     Assist   Type of Wheelchair: Manual    Wheelchair assist level: Supervision/Verbal cueing Max wheelchair distance: 150    Wheelchair 50 feet with 2 turns activity    Assist        Assist Level: Supervision/Verbal cueing   Wheelchair 150 feet activity     Assist      Assist Level: Supervision/Verbal cueing   Blood pressure 121/68, pulse 62, temperature 98.5 F (36.9 C), resp. rate 18, height 5\' 5"  (1.651 m), weight 95.9 kg, SpO2 98 %.  Medical Problem List and Plan: 1. Functional deficits  secondary to periprosthetic left distal femur fracture.S/P ORIF distal femur fracture 11/22. TDWB x 6 weeks with Bledsoe brace 0-30 degrees at all times             -patient may shower with bledsoe removed and leg carefully supported. Bledsoe can be reapplied once leg is dried off             -ELOS/Goals: 7-10 days, mod I goals  -Continue CIR therapies including PT, OT   2.  Antithrombotics: -DVT/anticoagulation:  Pharmaceutical: Eliquis x 4 weeks .Check vascular study             -antiplatelet therapy: N/A 3. Pain Management: Hydrocodone and Robaxin as needed             -Ice prn for pain/spasms Left lateral elbow pain , exam consistent with epicondylitis but given fall will check xray  12/2 xray with arthritis. Pt hasn't mentioned elbow pain over weekend 4. Mood/Behavior/Sleep: Zoloft 100 mg daily, melatonin as needed.  Provide emotional support             -  antipsychotic agents: N/A 5. Neuropsych/cognition: This patient is capable of making decisions on her own behalf. 6. Skin/Wound Care: Routine skin checks 7. Fluids/Electrolytes/Nutrition: Routine in and outs with follow-up chemistries on admit 8.Acute blood loss anemia.Follow up CBC             -hgb up to 9.1 12/1 9.  Hypertension.  Toprol-XL 25 mg daily.  Monitor with increased mobility             -bp well controlled 12/2 10.  Hyperlipidemia.  Lipitor 11.  Tobacco abuse.  Provide counseling 12.  Constipation.  MiraLAX daily             -pt says she's had recent bowel movement. I don't see one recorded.              -would like for her to have a bm this weekend. 13.  History of asthma.  Resume albuterol inhaler as needed  14.Obesity.BMI 35.95 15.  GERD.  Protonix will increase to BID 16. Urinary urgency and incontinence:  -sudden onset. Don't see any pharmaceutical cause  -UA is negative UCX pending  12/3 -obsv on myrbetriq--some improvement yesterday    LOS: 3 days A FACE TO FACE EVALUATION WAS PERFORMED  Meredith Staggers 12/27/2021, 8:19 AM

## 2021-12-28 MED ORDER — METHOCARBAMOL 500 MG PO TABS
500.0000 mg | ORAL_TABLET | Freq: Four times a day (QID) | ORAL | Status: DC | PRN
  Administered 2021-12-28 – 2021-12-31 (×12): 500 mg via ORAL
  Filled 2021-12-28 (×12): qty 1

## 2021-12-28 MED ORDER — NICOTINE 21 MG/24HR TD PT24
21.0000 mg | MEDICATED_PATCH | Freq: Every day | TRANSDERMAL | Status: DC
  Administered 2021-12-28 – 2021-12-30 (×3): 21 mg via TRANSDERMAL
  Filled 2021-12-28 (×4): qty 1

## 2021-12-28 NOTE — Progress Notes (Signed)
Physical Therapy Session Note  Patient Details  Name: Meghan Bowman MRN: [id number] Date of Birth: [birth year]  Today's Date: 12/28/2021 PT Individual Time: 5621-3086 PT Individual Time Calculation (min): 42 min   Short Term Goals: Week 1:  PT Short Term Goal 1 (Week 1): STG=LTG due to ELOS Week 2:     Skilled Therapeutic Interventions/Progress Updates:  - stairs using shower seat technique - completes 4 steps using minA for move of seat to each step - amb 36' x1 using RW and close supervision and w/c follow for safety/ fatigue.  - provided pt info from her chart in order to prient her to her recent medical stay. Provided with dates of admission, moves within hospt, and info from Careers adviser. Surgeon relates that he visitied with pt on 11/30 and wanted to see her in his office in one week for progress reporting and suture removal. Depending on d/c date, pt may still be here in hospital. Info related to nursing for follow up to postentially get imaging here prior to d/c home, as well as have sutures removed.  - bed mobility performed with Mod I/ supervision fo improved positioning   Therapy Documentation Precautions:  Precautions Precautions: Fall Required Braces or Orthoses: Other Brace Other Brace: Bledsoe Brace 0 to 30 degrees at all times (can be off for shower and in bed) Restrictions Weight Bearing Restrictions: Yes LLE Weight Bearing: Touchdown weight bearing General:   Vital Signs: Therapy Vitals Temp: 97.7 F (36.5 C) Temp Source: Oral Pulse Rate: 65 Resp: 17 BP: (!) 119/98 Patient Position (if appropriate): Lying Oxygen Therapy SpO2: 98 % O2 Device: Room Air Pain:    Therapy/Group: Individual Therapy  Loel Dubonnet PT, DPT, CSRS 12/28/2021, 5:44 PM

## 2021-12-28 NOTE — Progress Notes (Signed)
Occupational Therapy Session Note  Patient Details  Name: Meghan Bowman MRN: [id number] Date of Birth: 1960-02-17  Today's Date: 12/28/2021 OT Individual Time: 8341-9622 OT Individual Time Calculation (min): 75 min    Short Term Goals: Week 1:  OT Short Term Goal 1 (Week 1): STG=LTG 2/2 ELOS  Skilled Therapeutic Interventions/Progress Updates:    Pt received in bed and agreeable to a shower. Pt donned her bledsoe brace independently, sat to EOB with S, stood to RW with S,  ambulated to bathroom with S and occasional cues to ensure she was not putting too much weight on LLE.  Cues to push through triceps vs shrugging shoulders.  Pt toileted and then transferred to shower.  Doffed brace.  Pt did not stand in shower without brace but instead used lateral leans and long sponge to reach bottom and her feet.    Pt tried off, donned brace and ambulated to EOB to dress. Pt donned clothing with no difficulty, redonned brace over pants.    Pt then worked on UE strengthening exercises using red theraband for sh pulls and tricep extension.   Pt resting in bed with all needs met. Alarm set.   Therapy Documentation Precautions:  Precautions Precautions: Fall Required Braces or Orthoses: Other Brace Other Brace: Bledsoe Brace 0 to 30 degrees at all tmes Restrictions Weight Bearing Restrictions: Yes LLE Weight Bearing: Touchdown weight bearing      Pain: Pain Assessment Pain Score: 3  - premedicated ADL: ADL Eating: Independent Grooming: Setup Upper Body Bathing: Setup Where Assessed-Upper Body Bathing: Shower Lower Body Bathing: Supervision/safety Where Assessed-Lower Body Bathing: Shower Upper Body Dressing: Setup Where Assessed-Upper Body Dressing: Sitting at sink Lower Body Dressing: Supervision/safety Where Assessed-Lower Body Dressing: Wheelchair Toileting: Contact guard Where Assessed-Toileting: Glass blower/designer: Therapist, music Method: Actuary: Energy manager: Unable to assess Social research officer, government: Curator Method: Radiographer, therapeutic: Shower seat with back   Therapy/Group: Individual Therapy  [place] 12/28/2021, 11:33 AM

## 2021-12-28 NOTE — Progress Notes (Signed)
Physical Therapy Session Note  Patient Details  Name: Meghan Bowman MRN: [id number] Date of Birth: Dec 12, 1960  Today's Date: 12/28/2021 PT Individual Time: 1031-1115 PT Individual Time Calculation (min): 44 min   Short Term Goals: Week 1:  PT Short Term Goal 1 (Week 1): STG=LTG due to ELOS  Skilled Therapeutic Interventions/Progress Updates:  Patient supine in bed on entrance to room. Patient alert and agreeable to PT session. Has just completed shower with OT and feeling good. Much improved from Sat with no concerns from MD.   Patient with no pain complaint at start of session.  Therapeutic Activity: Bed Mobility: Pt performed supine <> sit with supervision/ Mod I and use of bed features. No cueing for technique required.  Transfers: Pt performed sit<>stand and stand pivot transfers throughout session with supervision. Provided verbal cues for safe brake application prior to all transfers. Discussion with pt re: potential need for w/c on d/c and ability to rent rather than purchase.   Wheelchair Mobility:  Pt propelled wheelchair from room to day room and educated on pivot point of w/c as well as how to turn corners vs turn in place. Pt demos both well with good understanding of space she occupies. Guided in slalom with 6cones spaced 5' apart. Demos learning throughout with less an less touching of cones while maneuvering. Adjusted course to cones 4' apart with continued improvement in maneuverability. Very conscious of occupied space.   Provided vc for technique and min visual cueing for placement of cones.  Gait Training:  Pt ambulated 35 ft using RW  with close supervision throughout slalom course of cones spaced 4 feet apart. Demonstrated good control of RW and understanding og minimal WB to LLE. Fatigued after one pass through. Is able to complete 2nd bout following seated rest break. .  Patient supine in bed at end of session with brakes locked, bed alarm set, and all needs within  reach.   Therapy Documentation Precautions:  Precautions Precautions: Fall Required Braces or Orthoses: Other Brace Other Brace: Bledsoe Brace 0 to 30 degrees at all times (can be off for shower and in bed) Restrictions Weight Bearing Restrictions: Yes LLE Weight Bearing: Touchdown weight bearing General:   Vital Signs: Therapy Vitals Temp: 97.7 F (36.5 C) Temp Source: Oral Pulse Rate: 65 Resp: 17 BP: (!) 119/98 Patient Position (if appropriate): Lying Oxygen Therapy SpO2: 98 % O2 Device: Room Air Pain:  No pain related this session.   Therapy/Group: Individual Therapy  Loel Dubonnet PT, DPT, CSRS 12/28/2021, 5:19 PM

## 2021-12-28 NOTE — Progress Notes (Signed)
PROGRESS NOTE   Subjective/Complaints:  Pt's urinary urgency is better. Reviewed with her that ucx negative. Pain levels controlled  ROS: Patient denies fever, rash, sore throat, blurred vision, dizziness, nausea, vomiting, diarrhea, cough, shortness of breath or chest pain,  headache, or mood change.    Objective:   No results found. No results for input(s): "WBC", "HGB", "HCT", "PLT" in the last 72 hours.  No results for input(s): "NA", "K", "CL", "CO2", "GLUCOSE", "BUN", "CREATININE", "CALCIUM" in the last 72 hours.   Intake/Output Summary (Last 24 hours) at 12/28/2021 1036 Last data filed at 12/27/2021 1300 Gross per 24 hour  Intake 240 ml  Output --  Net 240 ml        Physical Exam: Vital Signs Blood pressure (!) 142/79, pulse 67, temperature 98.2 F (36.8 C), resp. rate 18, height 5\' 5"  (1.651 m), weight 95.9 kg, SpO2 98 %.  Constitutional: No distress . Vital signs reviewed. HEENT: NCAT, EOMI, oral membranes moist Neck: supple Cardiovascular: RRR without murmur. No JVD    Respiratory/Chest: CTA Bilaterally without wheezes or rales. Normal effort    GI/Abdomen: BS +, non-tender, non-distended Ext: no clubbing, cyanosis, or edema Psych: pleasant and cooperative  Skin: No evidence of breakdown, no evidence of rash. Incisions cdi Neurologic: Cranial nerves II through XII intact, motor strength is 5/5 in bilateral deltoid, bicep, tricep, grip, RIght hip flexor, knee extensors, Bilat ankle dorsiflexor and plantar flexor Left HF 4-, KE at least antigravity but limited by Bledsoe brace --no change Sensation to LT intact B feet Musculoskeletal: Full range of motion in all 4 extremities where testable. Jt swelling near left knee.  +pain over left lateral epicondyle , no bruising     Assessment/Plan: 1. Functional deficits which require 3+ hours per day of interdisciplinary therapy in a comprehensive inpatient rehab  setting. Physiatrist is providing close team supervision and 24 hour management of active medical problems listed below. Physiatrist and rehab team continue to assess barriers to discharge/monitor patient progress toward functional and medical goals  Care Tool:  Bathing    Body parts bathed by patient: Right arm, Left arm, Chest, Abdomen, Front perineal area, Buttocks, Right upper leg, Right lower leg, Face     Body parts n/a: Left upper leg, Left lower leg (covered with bandage and bag)   Bathing assist Assist Level: Contact Guard/Touching assist     Upper Body Dressing/Undressing Upper body dressing   What is the patient wearing?: Pull over shirt    Upper body assist Assist Level: Set up assist    Lower Body Dressing/Undressing Lower body dressing      What is the patient wearing?: Pants, Underwear/pull up, Orthosis     Lower body assist Assist for lower body dressing: Minimal Assistance - Patient > 75%     Toileting Toileting    Toileting assist Assist for toileting: Contact Guard/Touching assist     Transfers Chair/bed transfer  Transfers assist     Chair/bed transfer assist level: Contact Guard/Touching assist     Locomotion Ambulation   Ambulation assist      Assist level: Minimal Assistance - Patient > 75% Assistive device: Walker-rolling Max distance: 15  Walk 10 feet activity   Assist     Assist level: Minimal Assistance - Patient > 75% Assistive device: Walker-rolling   Walk 50 feet activity   Assist Walk 50 feet with 2 turns activity did not occur: Safety/medical concerns         Walk 150 feet activity   Assist Walk 150 feet activity did not occur: Safety/medical concerns         Walk 10 feet on uneven surface  activity   Assist Walk 10 feet on uneven surfaces activity did not occur: Safety/medical concerns         Wheelchair     Assist   Type of Wheelchair: Manual    Wheelchair assist level:  Supervision/Verbal cueing Max wheelchair distance: 150    Wheelchair 50 feet with 2 turns activity    Assist        Assist Level: Supervision/Verbal cueing   Wheelchair 150 feet activity     Assist      Assist Level: Supervision/Verbal cueing   Blood pressure (!) 142/79, pulse 67, temperature 98.2 F (36.8 C), resp. rate 18, height 5\' 5"  (1.651 m), weight 95.9 kg, SpO2 98 %.  Medical Problem List and Plan: 1. Functional deficits secondary to periprosthetic left distal femur fracture.S/P ORIF distal femur fracture 11/22. TDWB x 6 weeks with Bledsoe brace 0-30 degrees at all times             -patient may shower with bledsoe removed and leg carefully supported. Bledsoe can be reapplied once leg is dried off             -ELOS/Goals: 7-10 days, mod I goals  -Continue CIR therapies including PT, OT   2.  Antithrombotics: -DVT/anticoagulation:  Pharmaceutical: Eliquis x 4 weeks .Check vascular study             -antiplatelet therapy: N/A 3. Pain Management: Hydrocodone and Robaxin as needed             -Ice prn for pain/spasms Left lateral elbow pain , exam consistent with epicondylitis but given fall will check xray  12/4 elbow xray with arthritis. Reviewed findings with pt again today 4. Mood/Behavior/Sleep: Zoloft 100 mg daily, melatonin as needed.  Provide emotional support             -antipsychotic agents: N/A 5. Neuropsych/cognition: This patient is capable of making decisions on her own behalf. 6. Skin/Wound Care: Routine skin checks 7. Fluids/Electrolytes/Nutrition: Routine in and outs with follow-up chemistries on admit 8.Acute blood loss anemia.              -hgb up to 9.1 12/1  -no signs of gross bleeding 9.  Hypertension.  Toprol-XL 25 mg daily.  Monitor with increased mobility             -bp well controlled 12/2 10.  Hyperlipidemia.  Lipitor 11.  Tobacco abuse.  Provide counseling 12.  Constipation.  MiraLAX daily             -had medium bm 12/2 13.   History of asthma.  Resume albuterol inhaler as needed  14.Obesity.BMI 35.95 15.  GERD.  Protonix will increase to BID 16. Urinary urgency and incontinence:  -sudden onset. Don't see any pharmaceutical cause  -UA is negative UCX negative  12/4 improvement with myrbetriq-->continue until discharge    LOS: 4 days A FACE TO FACE EVALUATION WAS PERFORMED  14/4 12/28/2021, 10:36 AM

## 2021-12-28 NOTE — Progress Notes (Signed)
Occupational Therapy Session Note  Patient Details  Name: Meghan Bowman MRN: [id number] Date of Birth: 1960-03-01  Today's Date: 12/28/2021 OT Individual Time: 1415-1448 OT Individual Time Calculation (min): 33 min    Short Term Goals: Week 1:  OT Short Term Goal 1 (Week 1): STG=LTG 2/2 ELOS  Skilled Therapeutic Interventions/Progress Updates:  Pt greeted supine in bed, pt agreeable to OT intervention. Session focus on BADL reeducation, functional mobility, transfer training and decreasing overall caregiver burden.                     Pt completed supine>sit with supervision, ambulatory toilet transfer with RW with CGA. Pt completed 3/3 toileting tasks with supervision, + urine void. Pt stood at sink for hand hygiene with supervision. Total A transport in w/c to tub room for time mgmt.   Pt completed ambulatory shower transfer with RW to TTB with [facility] for + safety and MIN verbal cues for sequencing, noted pt utilized grab bar on R side in order to scoot fully onto seat however pt does not have grab bar at home. Pt plans to ask land lord if grab bars can be installed however pt unsure if they will be. Encouraged pt to try transfer at a later time and see if she could complete task without use of grab bar.   Pt additionally completed ambulatory transfer with Rw to flat HOB with CGA. Pt with no difficulties elevating LLE onto bed.   Ended session with pt supine in bed with all needs within reach and bed alarm activated.                    Therapy Documentation Precautions:  Precautions Precautions: Fall Required Braces or Orthoses: Other Brace Other Brace: Bledsoe Brace 0 to 30 degrees at all times (can be off for shower and in bed) Restrictions Weight Bearing Restrictions: Yes LLE Weight Bearing: Touchdown weight bearing  Pain:no pain reported     Therapy/Group: Individual Therapy  Barron Schmid 12/28/2021, 3:49 PM

## 2021-12-29 NOTE — Progress Notes (Signed)
Patient requested PRN Robaxin. Upon recheck Robaxin was effective. Encouraged water intake, patient states she only drinks soda.

## 2021-12-29 NOTE — Progress Notes (Signed)
Physical Therapy Session Note  Patient Details  Name: Meghan Bowman MRN: [id number] Date of Birth: 1960/07/14  Today's Date: 12/29/2021 PT Individual Time: 1305-1420 PT Individual Time Calculation (min): 75 min   Short Term Goals: Week 1:  PT Short Term Goal 1 (Week 1): STG=LTG due to ELOS  Skilled Therapeutic Interventions/Progress Updates:  Patient supine in bed on entrance to room. Patient alert and agreeable to PT session.   Patient with mild pain complaint at start of session. But relates that it is tolerable.   Also relates during session that dtr may have COVID and will be unable to care for pt on d/c. Sole responsibility may fall on granddaughter at first.   Therapeutic Activity: Bed Mobility: Pt performed supine <> sit with Mod I. No cueing required for technique. Transfers: Pt performed sit<>stand and stand pivot transfers throughout session with supervision/ Mod I. Displays good balance with UE support throughout session.  Provided verbal cues for maintaining TDWB.  Gait Training:  Pt ambulated 85 ft using RW with close supervision. Demonstrated heavy BUE use to maintain TDWB. Problem solving stair navigation if pt is unable to get shower stool. Attempted several alternatives including bumping self in seated position up/ down, side-stepping, forward hopping, but pt unable to manage without proper hand positioning and using shower stool deemed safest and only alternative. Pt is able to perform 4 steps x3 while providing some cueing during final bout as If she is teaching granddaughter how to assist with stool and guarding.   Wheelchair Mobility:  Pt taken outside for improved mood and therapeutic listening for concerns on d/c.   Pt propelled wheelchair >150 feet x2 over even and uneven, outdoor surfaces with supervision/ mod I. Provided education for managing w/c wheels over larger cracks in concrete as well as in/ out of elevators and over other sliding door tracks in floor.  Pt demonstrates good ability to maintain momentum over transitions in floor.  D/t pt's surgeon requesting pt to come into office this Thu for imaging and suture removal, discussion held with PA, RN re; potential for suture removal on unit prior to d/c. Unfortunately, surgeon requires office visit. Pt recommended by PA to call surgeon's office and relate that she is in hospital and will not be able to come in Thu. Will need to schedule alternative appt time with family availability.   Patient supine in bed at end of session with brakes locked, bed alarm set, and all needs within reach.  Therapy Documentation Precautions:  Precautions Precautions: Fall Required Braces or Orthoses: Other Brace Other Brace: Bledsoe Brace 0 to 30 degrees at all times (can be off for shower and in bed) Restrictions Weight Bearing Restrictions: Yes LLE Weight Bearing: Touchdown weight bearing General:   Vital Signs: Therapy Vitals Temp: 97.6 F (36.4 C) Temp Source: Oral Pulse Rate: 65 Resp: 18 BP: (!) 140/75 Patient Position (if appropriate): Lying Oxygen Therapy SpO2: 96 % O2 Device: Room Air Pain:  Premedicated with mild pain complaint throughout session. Pt is able to self-reposition for improved comfort.    Therapy/Group: Individual Therapy  Loel Dubonnet PT, DPT, CSRS 12/29/2021, 7:52 AM

## 2021-12-29 NOTE — Progress Notes (Addendum)
Patient ID: DEIDRE CARINO, female   DOB: 03-05-1960, 61 y.o.   MRN: [id number] Met with the patient to review current situation, rehab process, team conference and plan of care. Reviewed secondary risks including HTN, HLD, prediabetes, smoking cessation and ABLA,  with medications and dietary modification recommendations. Patient asking about brace wearing orders; PAC made aware. Reviewed pain management; MD reordered muscle relaxer with prn meds. Also noted anti anxiety medication however concerned with smoking cessation; PAC notified of request for nicotine patch.  Continue to follow along to address educational needs to facilitate preparation for discharge to home. Margarito Liner

## 2021-12-29 NOTE — Progress Notes (Signed)
Occupational Therapy Session Note  Patient Details  Name: Meghan Bowman MRN: [id number] Date of Birth: [birth year]  Today's Date: 12/29/2021 OT Individual Time: 1004-1101 OT Individual Time Calculation (min): 57 min    Short Term Goals: Week 1:  OT Short Term Goal 1 (Week 1): STG=LTG 2/2 ELOS  Skilled Therapeutic Interventions/Progress Updates:  Pt greeted supine in bed, pt agreeable to OT intervention. Session focus on BADL reeducation, functional mobility, IADLs, standing tolerance, dynamic standing balance and decreasing overall caregiver burden.          Pt completed meal prep task in kitchen with Rw MODI, pt utilized Rw bag to retrieve items from cabinets. Pt reports she does not have items below knee level. Education provided on using reacher as needed in kitchen, pt able to retrieve items from refrigerator with RW MODI. Pt also stood to wash dishes at sink MODI. Education provided on energy conservation strategies as needed for kitchen tasks.   Pt also completed home mgmt task of making bed in apt with Rw MODI. Pt also completed simulated laundry task with RW and Rw bag with pt able to collect laundry from hamper with reacher.   Ended session with pt supine in bed with all needs within reach and bed alarm activated.                    Therapy Documentation Precautions:  Precautions Precautions: Fall Required Braces or Orthoses: Other Brace Other Brace: Bledsoe Brace 0 to 30 degrees at all times (can be off for shower and in bed) Restrictions Weight Bearing Restrictions: Yes LLE Weight Bearing: Touchdown weight bearing   Pain: no pain reported     Therapy/Group: Individual Therapy  Barron Schmid 12/29/2021, 12:20 PM

## 2021-12-29 NOTE — Progress Notes (Signed)
Occupational Therapy Session Note  Patient Details  Name: Meghan Bowman MRN: [id number] Date of Birth: [birth year]  Today's Date: 12/29/2021 OT Individual Time: 0800-0900 OT Individual Time Calculation (min): 60 min    Short Term Goals: Week 1:  OT Short Term Goal 1 (Week 1): STG=LTG 2/2 ELOS  Skilled Therapeutic Interventions/Progress Updates:  Skilled OT intervention completed with focus on ADL retraining, functional transfers, dynamic balance and retrieval of items at floor level. Pt received upright in bed, agreeable to session. No pain reported.  Nursing in room to administer meds at start of session. Completed transition to EOB with hinge knee brace already donned with supervision, sit > stand with supervision using RW then ambulatory transfer to toilet with supervision to [facility] over toilet. Able to manage toileting for continent void with distant supervision. Ambulated to EOB, with pt requesting to change clothes with preference to do in bed for removal of brace.   Able to doff/donn all clothing at bed level with supervision/set up A with series of posterior leans into supine and in long sitting during LB dressing. Did need cues when knee brace was removed to avoid excessive bending of the knee, however was able to manage brace donning and all components without assist. Did discuss AE that could be used to avoid bending such as reacher and sock aid however pt preferring to do without but reports having reacher at home. Transitioned to EOB, sit > stand with RW and ambulated to w/c all with supervision for oral care with mod I seated at sink. MD in room for rounds.  Transported dependently in w/c <> gym for time. Completed x2 sit > stands with supervision using RW during cornhole task for dynamic balance, with pt able to maintain stance during tossing with supervision, then retrieve beanbags using reacher with CGA for balance at the ambulatory level with RW. Cues needed for LLE WB tolerance as  pt would put greater than TDWB when distracted with conversation and task. Able to complete 2 rounds of this.  Back in room, transferred back to bed with supervision and remained upright in bed, with bed alarm on/activated, and with all needs in reach at end of session.   Therapy Documentation Precautions:  Precautions Precautions: Fall Required Braces or Orthoses: Other Brace Other Brace: Bledsoe Brace 0 to 30 degrees at all times (can be off for shower and in bed) Restrictions Weight Bearing Restrictions: Yes LLE Weight Bearing: Touchdown weight bearing    Therapy/Group: Individual Therapy  Melvyn Novas, MS, OTR/L  12/29/2021, 9:02 AM

## 2021-12-29 NOTE — Progress Notes (Signed)
PROGRESS NOTE   Subjective/Complaints:  No issues overnite, in good spirits today , talking with PT  ROS: Patient denies CP, SOB, N/V/D   Objective:   No results found. No results for input(s): "WBC", "HGB", "HCT", "PLT" in the last 72 hours.  No results for input(s): "NA", "K", "CL", "CO2", "GLUCOSE", "BUN", "CREATININE", "CALCIUM" in the last 72 hours.   Intake/Output Summary (Last 24 hours) at 12/29/2021 0825 Last data filed at 12/28/2021 1700 Gross per 24 hour  Intake 100 ml  Output --  Net 100 ml         Physical Exam: Vital Signs Blood pressure (!) 140/75, pulse 65, temperature 97.6 F (36.4 C), temperature source Oral, resp. rate 18, height 5\' 5"  (1.651 m), weight 95.9 kg, SpO2 96 %.   General: No acute distress Mood and affect are appropriate Heart: Regular rate and rhythm no rubs murmurs or extra sounds Lungs: Clear to auscultation, breathing unlabored, no rales or wheezes Abdomen: Positive bowel sounds, soft nontender to palpation, nondistended Extremities: No clubbing, cyanosis, or edema    Skin: No evidence of breakdown, no evidence of rash. Incisions cdi Neurologic: Cranial nerves II through XII intact, motor strength is 5/5 in bilateral deltoid, bicep, tricep, grip, RIght hip flexor, knee extensors, Bilat ankle dorsiflexor and plantar flexor Left HF 4-, KE at least antigravity but limited by Bledsoe brace --no change Sensation to LT intact B feet Musculoskeletal: Full range of motion in all 4 extremities where testable. Jt swelling near left knee.  +pain over left lateral epicondyle , no bruising     Assessment/Plan: 1. Functional deficits which require 3+ hours per day of interdisciplinary therapy in a comprehensive inpatient rehab setting. Physiatrist is providing close team supervision and 24 hour management of active medical problems listed below. Physiatrist and rehab team continue to  assess barriers to discharge/monitor patient progress toward functional and medical goals  Care Tool:  Bathing    Body parts bathed by patient: Right arm, Left arm, Chest, Abdomen, Front perineal area, Buttocks, Right upper leg, Right lower leg, Face     Body parts n/a: Left upper leg, Left lower leg (covered with bandage and bag)   Bathing assist Assist Level: Contact Guard/Touching assist     Upper Body Dressing/Undressing Upper body dressing   What is the patient wearing?: Pull over shirt    Upper body assist Assist Level: Set up assist    Lower Body Dressing/Undressing Lower body dressing      What is the patient wearing?: Pants, Underwear/pull up, Orthosis     Lower body assist Assist for lower body dressing: Minimal Assistance - Patient > 75%     Toileting Toileting    Toileting assist Assist for toileting: Supervision/Verbal cueing     Transfers Chair/bed transfer  Transfers assist     Chair/bed transfer assist level: Contact Guard/Touching assist     Locomotion Ambulation   Ambulation assist      Assist level: Minimal Assistance - Patient > 75% Assistive device: Walker-rolling Max distance: 15   Walk 10 feet activity   Assist     Assist level: Minimal Assistance - Patient > 75% Assistive device: Walker-rolling  Walk 50 feet activity   Assist Walk 50 feet with 2 turns activity did not occur: Safety/medical concerns         Walk 150 feet activity   Assist Walk 150 feet activity did not occur: Safety/medical concerns         Walk 10 feet on uneven surface  activity   Assist Walk 10 feet on uneven surfaces activity did not occur: Safety/medical concerns         Wheelchair     Assist Is the patient using a wheelchair?: Yes Type of Wheelchair: Manual    Wheelchair assist level: Supervision/Verbal cueing Max wheelchair distance: 150    Wheelchair 50 feet with 2 turns activity    Assist        Assist  Level: Supervision/Verbal cueing   Wheelchair 150 feet activity     Assist      Assist Level: Supervision/Verbal cueing   Blood pressure (!) 140/75, pulse 65, temperature 97.6 F (36.4 C), temperature source Oral, resp. rate 18, height 5\' 5"  (1.651 m), weight 95.9 kg, SpO2 96 %.  Medical Problem List and Plan: 1. Functional deficits secondary to periprosthetic left distal femur fracture.S/P ORIF distal femur fracture 11/22. TDWB x 6 weeks with Bledsoe brace 0-30 degrees at all times             -patient may shower with bledsoe removed and leg carefully supported. Bledsoe can be reapplied once leg is dried off             -ELOS/Goals: 7-10 days, mod I goals  -Continue CIR therapies including PT, OT , team conf in am   2.  Antithrombotics: -DVT/anticoagulation:  Pharmaceutical: Eliquis x 4 weeks .Check vascular study             -antiplatelet therapy: N/A 3. Pain Management: Hydrocodone and Robaxin as needed             -Ice prn for pain/spasms Left lateral elbow pain , exam consistent with epicondylitis but given fall will check xray  12/4 elbow xray with arthritis. Reviewed findings with pt again today 4. Mood/Behavior/Sleep: Zoloft 100 mg daily, melatonin as needed.  Provide emotional support             -antipsychotic agents: N/A 5. Neuropsych/cognition: This patient is capable of making decisions on her own behalf. 6. Skin/Wound Care: Routine skin checks 7. Fluids/Electrolytes/Nutrition: Routine in and outs with follow-up chemistries on admit 8.Acute blood loss anemia.              -hgb up to 9.1 12/1  -no signs of gross bleeding 9.  Hypertension.  Toprol-XL 25 mg daily.  Monitor with increased mobility             -bp well controlled 12/2 10.  Hyperlipidemia.  Lipitor 11.  Tobacco abuse.  Provide counseling 12.  Constipation.  MiraLAX daily             -had medium bm 12/2 13.  History of asthma.  Resume albuterol inhaler as needed  14.Obesity.BMI 35.95 15.  GERD.   Protonix will increase to BID 16. Urinary urgency and incontinence:  -sudden onset. Don't see any pharmaceutical cause  -UA is negative UCX negative  12/4 improvement with myrbetriq-->continue until discharge    LOS: 5 days A FACE TO FACE EVALUATION WAS PERFORMED  14/4 12/29/2021, 8:25 AM

## 2021-12-30 MED ORDER — HYDROCODONE-ACETAMINOPHEN 10-325 MG PO TABS: 1.0000 | ORAL_TABLET | Freq: Four times a day (QID) | ORAL | 0 refills | Status: DC | PRN

## 2021-12-30 MED ORDER — METHOCARBAMOL 500 MG PO TABS: 500.0000 mg | ORAL_TABLET | Freq: Four times a day (QID) | ORAL | 0 refills | Status: DC | PRN

## 2021-12-30 MED ORDER — VITAMIN D (ERGOCALCIFEROL) 1.25 MG (50000 UNIT) PO CAPS: 50000.0000 [IU] | ORAL_CAPSULE | ORAL | 0 refills | Status: DC

## 2021-12-30 MED ORDER — NICOTINE 21 MG/24HR TD PT24: MEDICATED_PATCH | TRANSDERMAL | 0 refills | Status: DC

## 2021-12-30 MED ORDER — DICLOFENAC SODIUM 1 % EX GEL: 2.0000 g | Freq: Four times a day (QID) | CUTANEOUS | 0 refills | Status: DC

## 2021-12-30 MED ORDER — DICLOFENAC SODIUM 1 % EX GEL
2.0000 g | Freq: Four times a day (QID) | CUTANEOUS | Status: DC
  Administered 2021-12-30 – 2021-12-31 (×2): 2 g via TOPICAL
  Filled 2021-12-30: qty 100

## 2021-12-30 MED ORDER — METOPROLOL SUCCINATE ER 25 MG PO TB24: 25.0000 mg | ORAL_TABLET | Freq: Every day | ORAL | 0 refills | Status: DC

## 2021-12-30 MED ORDER — APIXABAN 2.5 MG PO TABS: 2.5000 mg | ORAL_TABLET | Freq: Two times a day (BID) | ORAL | 0 refills | Status: DC

## 2021-12-30 NOTE — Discharge Summary (Signed)
Physician Discharge Summary  Patient ID: Meghan FischerVictoria J Bowman MRN: [id number] DOB/AGE: 02-17-1960 61 y.o.  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 DVT prophylaxis Acute blood loss anemia Hypertension Hyperlipidemia Tobacco use Constipation GERD History of asthma Mood stabilization   Discharged Condition: Stable  Significant Diagnostic Studies: VAS US LOWER EXTREMITY VENOUS (DVT)  Result Date: 12/26/2021  Lower Venous DVT Study Patient Name:  Meghan Bowman  Date of Exam:   12/25/2021 Medical Rec #: [id number]       Accession #:    [id number] Date of Birth: 02-17-1960      Patient Gender: F Patient Age:   [age] years [facility] Procedure:      VAS US LOWER EXTREMITY VENOUS (DVT) Referring Phys: Mariam DollarANIEL Shelbi Vaccaro --------------------------------------------------------------------------------  Indications: Swelling.  Risk Factors: None identified. Limitations: Poor ultrasound/tissue interface, bandages and open wound. Comparison Study: No prior studies. Performing Technologist: Chanda BusingGregory Collins RVT  Examination Guidelines: A complete evaluation includes B-mode imaging, spectral Doppler, color Doppler, and power Doppler as needed of all accessible portions of each vessel. Bilateral testing is considered an integral part of a complete examination. Limited examinations for reoccurring indications may be performed as noted. The reflux portion of the exam is performed with the patient in reverse Trendelenburg.  +---------+---------------+---------+-----------+----------+--------------+ RIGHT    CompressibilityPhasicitySpontaneityPropertiesThrombus Aging +---------+---------------+---------+-----------+----------+--------------+ CFV      Full           Yes      Yes                                  +---------+---------------+---------+-----------+----------+--------------+ SFJ      Full                                                        +---------+---------------+---------+-----------+----------+--------------+ FV Prox  Full                                                        +---------+---------------+---------+-----------+----------+--------------+ FV Mid   Full                                                        +---------+---------------+---------+-----------+----------+--------------+ FV DistalFull                                                        +---------+---------------+---------+-----------+----------+--------------+ PFV      Full                                                        +---------+---------------+---------+-----------+----------+--------------+  POP      Full           Yes      Yes                                 +---------+---------------+---------+-----------+----------+--------------+ PTV      Full                                                        +---------+---------------+---------+-----------+----------+--------------+ PERO     Full                                                        +---------+---------------+---------+-----------+----------+--------------+   +---------+---------------+---------+-----------+----------+--------------+ LEFT     CompressibilityPhasicitySpontaneityPropertiesThrombus Aging +---------+---------------+---------+-----------+----------+--------------+ CFV      Full           Yes      Yes                                 +---------+---------------+---------+-----------+----------+--------------+ SFJ      Full                                                        +---------+---------------+---------+-----------+----------+--------------+ FV Prox  Full                                                         +---------+---------------+---------+-----------+----------+--------------+ FV Mid                  Yes      Yes                                 +---------+---------------+---------+-----------+----------+--------------+ FV Distal               Yes      Yes                                 +---------+---------------+---------+-----------+----------+--------------+ PFV      Full                                                        +---------+---------------+---------+-----------+----------+--------------+ POP      Full           Yes      Yes                                 +---------+---------------+---------+-----------+----------+--------------+  PTV      Full                                                        +---------+---------------+---------+-----------+----------+--------------+ PERO     Full                                                        +---------+---------------+---------+-----------+----------+--------------+     Summary: RIGHT: - There is no evidence of deep vein thrombosis in the lower extremity.  - No cystic structure found in the popliteal fossa.  LEFT: - There is no evidence of deep vein thrombosis in the lower extremity. However, portions of this examination were limited- see technologist comments above.  - No cystic structure found in the popliteal fossa.  *See table(s) above for measurements and observations. Electronically signed by Gerarda Fraction on 12/26/2021 at 1:26:20 PM.    Final    DG Elbow 2 Views Left  Result Date: 12/25/2021 CLINICAL DATA:  Left elbow pain. EXAM: LEFT ELBOW - 2 VIEW COMPARISON:  None available FINDINGS: Mild medial elbow joint space narrowing and peripheral trochlea/coronoid process degenerative spurring. Minimal degenerative spurring at the far medial aspect of the radial head articular surface. No definite elbow joint effusion. No acute fracture line is seen. No dislocation. IMPRESSION: Mild medial elbow  osteoarthritis. No acute fracture is seen. Electronically Signed   By: Neita Garnet M.D.   On: 12/25/2021 10:28   DG FEMUR PORT MIN 2 VIEWS LEFT  Result Date: 12/24/2021 CLINICAL DATA:  Postop EXAM: LEFT FEMUR PORTABLE 4 VIEWS COMPARISON:  12/16/2021 FINDINGS: ORIF of distal femoral fracture and postop changes total knee arthroplasty again noted. Grossly anatomic alignment. No change compared to prior study. IMPRESSION: Postop changes. Electronically Signed   By: Layla Maw M.D.   On: 12/24/2021 08:34   DG FEMUR PORT MIN 2 VIEWS LEFT  Result Date: 12/16/2021 CLINICAL DATA:  Postoperative state EXAM: LEFT FEMUR PORTABLE 2 VIEWS COMPARISON:  12/15/2021 FINDINGS: Postsurgical changes distal femur fracture fixation with lateral plate and screws. Expected soft tissue changes. Improved fracture alignment. Unchanged left total knee arthroplasty hardware. IMPRESSION: Postsurgical changes of distal femur ORIF. Improved fracture alignment. No evidence of immediate complication. Electronically Signed   By: Caprice Renshaw M.D.   On: 12/16/2021 16:33   DG Knee 1-2 Views Left  Result Date: 12/16/2021 CLINICAL DATA:  Surgical internal fixation of left femoral fracture. EXAM: LEFT KNEE - 1-2 VIEW; DG C-ARM 1-60 MIN-NO REPORT Radiation exposure index: 12.124 mGy. COMPARISON:  December 15, 2021. FINDINGS: Twenty intraoperative fluoroscopic images were obtained the left femur. These images demonstrate surgical internal fixation of distal left femoral fracture. IMPRESSION: Fluoroscopic guidance provided during surgical internal fixation of distal left femoral fracture. Electronically Signed   By: Lupita Raider M.D.   On: 12/16/2021 15:43   DG C-Arm 1-60 Min-No Report  Result Date: 12/16/2021 Fluoroscopy was utilized by the requesting physician.  No radiographic interpretation.   DG C-Arm 1-60 Min-No Report  Result Date: 12/16/2021 Fluoroscopy was utilized by the requesting physician.  No radiographic  interpretation.   DG FEMUR PORT MIN 2 VIEWS LEFT  Result Date:  12/15/2021 CLINICAL DATA:  Distal left femur fracture EXAM: LEFT FEMUR PORTABLE 2 VIEWS COMPARISON:  12/15/2021 knee radiograph FINDINGS: Redemonstrated obliquely oriented fracture through the distal femoral diaphysis, which extends to the level of a left knee arthroplasty. Unchanged apex anterior angulation and 5 mm distraction at the fracture site. No additional fracture in the left femur. Soft tissue swelling about the distal femur. Surgical clips posterior to the knee. IMPRESSION: Redemonstrated obliquely oriented fracture through the distal femoral diaphysis, which extends to the level of a left knee arthroplasty. No additional fracture is seen. Electronically Signed   By: Wiliam Ke M.D.   On: 12/15/2021 21:07   DG Knee Left Port  Result Date: 12/15/2021 CLINICAL DATA:  Fall. EXAM: PORTABLE LEFT KNEE - 1-2 VIEW COMPARISON:  Left knee x-ray 12/24/2019 FINDINGS: Left knee total arthroplasty is present in anatomic alignment. There is an acute oblique fracture through the distal femoral diaphysis extending to the level of the arthroplasty. There is apex anterior angulation and 5 mm of distraction of the fracture sites. There is soft tissue swelling surrounding the fracture. Surgical clips are seen posterior to the knee. IMPRESSION: Acute oblique fracture through the distal femoral diaphysis extending to the level of the arthroplasty. Electronically Signed   By: Darliss Cheney M.D.   On: 12/15/2021 17:08    Labs:  Basic Metabolic Panel: Recent Labs  Lab 12/25/21 0605  NA 138  K 3.6  CL 107  CO2 25  GLUCOSE 133*  BUN 5*  CREATININE 0.63  CALCIUM 8.9    CBC: Recent Labs  Lab 12/25/21 0605  WBC 8.9  NEUTROABS 4.6  HGB 9.1*  HCT 28.0*  MCV 91.8  PLT 339    CBG: No results for input(s): "GLUCAP" in the last 168 hours.  Family history.  Father with thyroid cancer.  Paternal grandfather with heart disease.  Denies  any colon cancer esophageal cancer or breast cancer  Brief HPI:   Meghan Bowman is a 61 y.o. right-handed female with history of hypertension asthma hyperlipidemia tobacco use left total knee arthroplasty 2015 chronic pain syndrome maintained on oxycodone.  Per chart review lives alone independent prior to admission.  Presented 12/15/2021 after ground-level fall sustaining a periprosthetic left distal femur fracture.  Underwent ORIF 12/16/2021 per Dr.Marchwiany.  Touchdown weightbearing left lower extremity with hinged knee brace placed.  Placed on Eliquis for DVT prophylaxis x 4 weeks.  Acute blood loss anemia 8.8 and monitored.  Therapy evaluations completed due to patient decreased functional mobility was admitted for a comprehensive rehab program.   Hospital Course: Meghan Bowman was admitted to rehab 12/24/2021 for inpatient therapies to consist of PT, ST and OT at least three hours five days a week. Past admission physiatrist, therapy team and rehab RN have worked together to provide customized collaborative inpatient rehab.  Pertaining to patient's periprosthetic left distal femur fracture status post ORIF 11/22.  Touchdown weightbearing x 6 weeks with Bledsoe brace 0-30 degrees.  Patient would follow-up orthopedic services.  Neurovascular sensation intact.  She will continue Eliquis x 4 weeks total for DVT prophylaxis.  Venous Doppler studies negative.  Pain managed with use of Voltaren gel with Robaxin and hydrocodone as needed.  Lipitor ongoing for hyperlipidemia.  History of tobacco use maintained on the NicoDerm patch.  Blood pressure controlled on Toprol and would need outpatient follow-up.  Acute blood loss anemia 9.1 no bleeding episodes.  Mood stabilization with Zoloft and emotional support provided.  Patient was attending full therapies.  Bouts of constipation resolved with laxative assistance.   Blood pressures were monitored on TID basis and controlled     Rehab course: During  patient's stay in rehab weekly team conferences were held to monitor patient's progress, set goals and discuss barriers to discharge. At admission, patient required minimal assist lateral scoot transfers minimal guard with a rolling walker  Physical exam.  Blood pressure 112/47 pulse 72 temperature 98 respirations 16 oxygen saturation is 98% room air Constitutional.  No acute distress HEENT Head.  Normocephalic and atraumatic Eyes.  Pupils round and reactive to light no discharge without nystagmus Neck.  Supple nontender no JVD without thyromegaly Cardiac regular rate and rhythm without any extra sounds or murmur heard Abdomen.  Soft nontender positive bowel sounds without rebound Respiratory effort normal no respiratory distress without wheeze Skin.  Left knee dressed incision clean and dry Neurologic.  Alert and oriented x 3.  Memory intact.  Motor strength 5/5 except pain inhibition of left lower extremity.  He/She  has had improvement in activity tolerance, balance, postural control as well as ability to compensate for deficits. He/She has had improvement in functional use RUE/LUE  and RLE/LLE as well as improvement in awareness.  Perform sit to stand supervision.  Ambulates with rolling walker 85 feet touchdown weightbearing.  She gathers her belongings for activities daily when homemaking.  Needs some assist for lower body ADLs.       Disposition: Discharge to home   Diet: Regular  Special Instructions: No driving smoking or alcohol  Touchdown weightbearing left lower extremity.  Bledsoe brace locked at 0-30.  Medications at discharge. 1.  Tylenol as needed 2.  Eliquis 2.5 mg p.o. twice daily until 01/15/2022 and stop 3.  Lipitor 40 mg p.o. daily 4.  Hydrocodone 10/325 mg 1 tablet every 6 hours as needed  5.  Voltaren gel 2 g 4 times daily to affected area 6.  Robaxin 500 mg every 6 hours as needed muscle spasms 7.  Toprol-XL 25 mg p.o. daily 8.  NicoDerm patch taper as  directed 9.  Prilosec 40 mg daily 10.  MiraLAX daily hold for loose stools 11.  Zoloft 100 mg p.o. nightly 12.  Symbicort 80-4.5 mcg 2 puffs twice daily as needed 13.  Albuterol inhaler 2 puffs every 6 hours as needed  30-35 minutes were spent completing discharge summary and discharge planning      Follow-up Information     Kirsteins, Victorino Sparrow, MD Follow up.   Specialty: Physical Medicine and Rehabilitation Why: No Formal follow up needed Contact information: [address] [place] [address] [phone]         [name], MD Follow up.   Specialty: Orthopedic Surgery Why: call for appointment Contact information: [address] [place] [address] [phone]         Lewayne Bunting, MD Follow up.   Specialty: Cardiology Why: as needed Contact information: [address] [place] [address] [phone]                 Signed: Mcarthur Rossetti Annaliah Rivenbark 12/31/2021, 5:21 AM

## 2021-12-30 NOTE — Patient Care Conference (Cosign Needed Addendum)
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 Claudette Laws Social Worker Present: Lavera Guise, BSW Nurse Present: Chana Bode, RN PT Present: Ralph Leyden, PT OT Present: Primitivo Gauze, OT SLP Present: Eilene Ghazi, SLP PPS Coordinator present : Fae Pippin, SLP     Current Status/Progress Goal Weekly Team Focus  Bowel/Bladder   Continent of bowel and bladder, urinary urgency at times, LBM-12/5   Maintain regular bowel and urinary pattern   Assist with toileting needs every shift and prn    Swallow/Nutrition/ Hydration               ADL's   CGA for ambulatory ADL transfers with RW, S for 3/3 toileting tasks, set- up UB dressing, MIN A LB dressing. S sit<>stands with RW   MODI   IADLs, dynamic balance, BADL reeducation, AE training, compensatory methods education    Mobility   Bed mobility = Mod I, sit <> stand = supervision/ Mod I, stand pivot = supervision, ambulation = supervision/ CGA, stairs require use of shower stool to maintain TDWB, w/c mobility = supervision/ Mod I with some MinA for parts mgmt   Mod I overall with CGA for steps  stair training, functional transfers, maintaining TDWB, ambulation, w/c mobility, family education    Communication                Safety/Cognition/ Behavioral  Observations               Pain   REports pain to left leg surgical site-managed with scheduled pain meds-partial effects noted   Pain level <5/10   Assess and address pain every shift and prn    Skin   Surgical site to left leg, no other areas of impaired skin, no s/sx of infection   No s/sx of infection  Assess and address pain every shift and prn      Discharge Planning:  Patient anticipates discharging home MOD I. Daughter able to assist intermittently/PRN.   Team Discussion: Patient with left elbow pain; MD added Voltaren gel. Questions about wearing of bledsoe brace; MD to clarify. Ortho saw patient 12/29/21 for dressing change/incision assessment.  Patient on target to meet rehab goals: yes,   *See Care Plan and progress notes for long and short-term goals.   Revisions to Treatment Plan:  Practice with crutches and walker use  Teaching Needs: Safety, skin care, brace management, medications, use of crutches/walker, transfers, toileting,etc.  Current Barriers to Discharge: Home enviroment access/layout and Lack of/limited family support and weight bearing precautions  Possible Resolutions to Barriers:  DME RW, W/C, BSC, Shower chair HH follow up services     Medical Summary Current Status: staples out LLE, Pain controlled  Barriers to Discharge: Weight bearing restrictions   Possible Resolutions to [employer] Focus: plan d/c today after coordinating d/c needs   Continued Need  for Acute Rehabilitation Level of Care: The patient requires daily medical management by a physician with specialized training in physical medicine and rehabilitation for the following reasons: Direction of a multidisciplinary physical rehabilitation program to maximize functional independence : Yes Medical management of patient stability for increased activity during participation in an intensive rehabilitation regime.: Yes Analysis of laboratory values and/or radiology reports  with any subsequent need for medication adjustment and/or medical intervention. : Yes   I attest that I was present, lead the team conference, and concur with the assessment and plan of the team.   Chana Bode B 12/30/2021, 4:15 PM

## 2021-12-30 NOTE — Progress Notes (Signed)
Occupational Therapy Discharge Summary  Patient Details  Name: Meghan Bowman MRN: [id number] Date of Birth: August 01, 1960  Date of Discharge from [place] [facility] service:December 30, 2021   Patient has met 11 of 11 long term goals due to improved activity tolerance, improved balance, and ability to compensate for deficits.  Patient to discharge at overall Modified Independent level.  Patient's care partner is independent to provide the necessary physical assistance at discharge with high level ADLs.  Pt education only, family did not need to attend education.    Reasons goals not met: n/a  Recommendation:  Patient will benefit from ongoing skilled OT services in home health setting to continue to advance functional skills in the area of BADL and iADL.  Equipment: BSC and shower seat (pt will also need a tub bench but will purchase one on her own)  Reasons for discharge: treatment goals met  Patient/family agrees with progress made and goals achieved: Yes  OT Discharge Precautions/Restrictions  Precautions Precautions: Fall Required Braces or Orthoses: Other Brace Other Brace: Bledsoe Brace 0 to 30 degrees at all times (can be off for shower and in bed) Restrictions Weight Bearing Restrictions: Yes LLE Weight Bearing: Touchdown weight bearing   Pain Pain Assessment Pain Scale: 0-10 Pain Score: 2  Pain Type: Acute pain;Surgical pain Pain Location: Leg Pain Orientation: Left Pain Descriptors / Indicators: Aching;Discomfort Pain Onset: Progressive Patients Stated Pain Goal: 0 ADL ADL Eating: Independent Grooming: Independent Upper Body Bathing: Independent Where Assessed-Upper Body Bathing: Shower Lower Body Bathing: Modified independent Where Assessed-Lower Body Bathing: Shower Upper Body Dressing: Independent Where Assessed-Upper Body Dressing: Edge of bed Lower Body Dressing: Modified independent Where Assessed-Lower Body Dressing: Edge of bed Toileting: Modified  independent Where Assessed-Toileting: Glass blower/designer: Diplomatic Services operational officer Method: Counselling psychologist: Energy manager: Modified independent Clinical cytogeneticist Method: Optometrist: Facilities manager: Modified independent Social research officer, government Method: Heritage manager: Civil engineer, contracting with back Vision Baseline Vision/History: 1 Wears glasses (reading) Patient Visual Report: No change from baseline Vision Assessment?: No apparent visual deficits Perception  Perception: Within Functional Limits Praxis Praxis: Intact Cognition Cognition Overall Cognitive Status: Within Functional Limits for tasks assessed Arousal/Alertness: Awake/alert Orientation Level: Person;Place;Situation Person: Oriented Place: Oriented Situation: Oriented Memory: Appears intact Awareness: Appears intact Problem Solving: Appears intact Safety/Judgment: Appears intact Brief Interview for Mental Status (BIMS) Repetition of Three Words (First Attempt): 3 Temporal Orientation: Year: Correct Temporal Orientation: Month: Accurate within 5 days Temporal Orientation: Day: Correct Recall: "Sock": Yes, no cue required Recall: "Blue": Yes, no cue required Recall: "Bed": Yes, no cue required BIMS Summary Score: 15 Sensation Sensation Light Touch: Appears Intact Hot/Cold: Appears Intact Proprioception: Appears Intact Stereognosis: Appears Intact Coordination Gross Motor Movements are Fluid and Coordinated: Yes Fine Motor Movements are Fluid and Coordinated: Yes Heel Shin Test: unable to perform on the LLE due to beldsoe locked 0-30deg Motor  Motor Motor: Within Functional Limits Mobility  Bed Mobility Bed Mobility: Rolling Right;Rolling Left;Supine to Sit;Sit to Supine Rolling Right: Independent Rolling Left: Independent Supine to Sit: Independent;Independent with assistive device Sit  to Supine: Independent Transfers Sit to Stand: Independent with assistive device Stand to Sit: Independent with assistive device  Trunk/Postural Assessment  Cervical Assessment Cervical Assessment: Within Functional Limits Thoracic Assessment Thoracic Assessment: Exceptions to [facility] (rounded shoulders) Lumbar Assessment Lumbar Assessment: Within Functional Limits Postural Control Postural Control: Within Functional Limits  Balance Balance Balance Assessed: Yes Static Sitting Balance Static Sitting - Level  of Assistance: 7: Independent Dynamic Sitting Balance Dynamic Sitting - Level of Assistance: 7: Independent Static Standing Balance Static Standing - Level of Assistance: 6: Modified independent (Device/Increase time) Dynamic Standing Balance Dynamic Standing - Balance Support: Bilateral upper extremity supported Dynamic Standing - Level of Assistance: 5: Stand by assistance Extremity/Trunk Assessment RUE Assessment RUE Assessment: Within Functional Limits LUE Assessment LUE Assessment: Within Functional Limits Active Range of Motion (AROM) Comments: WFL General Strength Comments: [facility]   Fritz Cauthon 12/30/2021, 12:56 PM

## 2021-12-30 NOTE — Progress Notes (Signed)
Physical Therapy Session Note  Patient Details  Name: ANNET MANUKYAN MRN: [id number] Date of Birth: [birth year]  Today's Date: 12/30/2021 PT Individual Time: 1430-1500 PT Individual Time Calculation (min): 30 min   Short Term Goals: Week 1:  PT Short Term Goal 1 (Week 1): STG=LTG due to ELOS  Skilled Therapeutic Interventions/Progress Updates:      Therapy Documentation Precautions:  Precautions Precautions: Fall Required Braces or Orthoses: Other Brace Other Brace: Bledsoe Brace 0 to 30 degrees at all times (can be off for shower and in bed) Restrictions Weight Bearing Restrictions: Yes LLE Weight Bearing: Touchdown weight bearing  Pt agreeable to PT session with emphasis on transfer training. Pt mod I with bed mobility and sit to stand. Pt requires verbal cueing for TDWB of L LE with gait ~2 ft with RW to w/c as pt initially performed full weight bearing of L LE. Pt propelled w/c to ortho gym >150 ft with set-up assist to practice car transfer. Pt reports she feels foggy and off and states she started a new medication that is making her act different. Pt positions w/c at far distance from car and attempts to perform squat pivot without RW and starts transfer prior to locking w/c brakes. PT educated pt on checking to make sure w/c brakes are locked prior to transfers. PT also educated pt to reposition w/c at appropriate distance if she intends to squat pivot. Pt decided to utilize RW and perform stand pivot instead and required supervision for verbal cueing and safety. Pt transitioned to practice utilizing reacher and picking objects off the floor. Pt attempted to stand without locking brakes and required  verbal cues for safety. Pt able to perform reaching for object with reacher with (S) for safety and verbal cueing with transfer. Pt transported to room and nursing notified of patient's change in behavior. Pt left semi-reclined in bed with all needs in reach and bed alarm on.    Therapy/Group: Individual Therapy  Truitt Leep Truitt Leep PT, DPT  12/30/2021, 7:52 AM

## 2021-12-30 NOTE — Progress Notes (Incomplete)
Inpatient Rehabilitation Discharge Medication Review by a Pharmacist  A complete drug regimen review was completed for this patient to identify any potential clinically significant medication issues.  High Risk Drug Classes Is patient taking? Indication by Medication  Antipsychotic {Receiving?:26196}   Anticoagulant {Receiving?:26196} Apixaban - VTE ppx  Antibiotic {Receiving?:26196}   Opioid {Receiving?:26196} Norco - PRN pain   Antiplatelet {Receiving?:26196}   Hypoglycemics/insulin {Yes or No?:26198}   Vasoactive Medication {Receiving?:26196} Metoprolol - HTN Nitroglycerin - PRN CP  Chemotherapy {Receiving Chemo?:26197}   Other {Yes or No?:26198} Albuterol, Symbicort- PRN SOB Atorvastatin - HLD Diclofenac - pain Flonase, Xyzal - PRN allergies Meclizine - PRN dizziness Methocarbamol - PRN muscle spasms Omeprazole - GERD ppx Mirabegron - urinary urgency *** Nicotine patch - smoking cessation  Miralax - PRN constipation Sertraline - mood Tobramycin ophthalmic soln - infection ppx Vit D, MVI - supplement     Type of Medication Issue Identified Description of Issue Recommendation(s)  Drug Interaction(s) (clinically significant)     Duplicate Therapy     Allergy     No Medication Administration End Date     Incorrect Dose     Additional Drug Therapy Needed     Significant med changes from prior encounter (inform family/care partners about these prior to discharge). Amlodipine, pregabalin, oxycodone,  discontinued  Communicate medication changes with patient/family at discharge  Other       Clinically significant medication issues were identified that warrant physician communication and completion of prescribed/recommended actions by midnight of the next day:  {Yes or No?:26198}  Name of provider notified for urgent issues identified: ***   Provider Method of Notification: ***    Pharmacist comments: *** Eliquis 2.5 mg BID x 28 days for VTE ppx  started 11/27, end  12/25   Time spent performing this drug regimen review (minutes): 20   Thank you for allowing pharmacy to be a part of this patient's care.  Thelma Barge, PharmD Clinical Pharmacist

## 2021-12-30 NOTE — Plan of Care (Signed)
  Problem: Consults Goal: RH GENERAL PATIENT EDUCATION Description: See Patient Education module for education specifics. Outcome: Progressing   Problem: RH SAFETY Goal: RH STG ADHERE TO SAFETY PRECAUTIONS W/ASSISTANCE/DEVICE Description: STG Adhere to Safety Precautions With cues Assistance/Device. Outcome: Progressing   Problem: RH PAIN MANAGEMENT Goal: RH STG PAIN MANAGED AT OR BELOW PT'S PAIN GOAL Description: < 4 with prns Outcome: Progressing

## 2021-12-30 NOTE — Progress Notes (Signed)
Occupational Therapy Session Note  Patient Details  Name: Meghan Bowman MRN: [id number] Date of Birth: 05/23/1960  Today's Date: 12/30/2021 OT Individual Time: 1035-1130 and 1035-1130 OT Individual Time Calculation (min): 55 min and 55 min   Short Term Goals: Week 1:  OT Short Term Goal 1 (Week 1): STG=LTG 2/2 ELOS  Skilled Therapeutic Interventions/Progress Updates:    Visit 1: Pain: Pain Assessment Pain Scale: 0-10 Pain Score: 7  Pain Type: Acute pain Pain Location: Leg Pain Orientation: Left Pain Descriptors / Indicators: Aching Pain Frequency: Constant Pain Onset: Progressive Patients Stated Pain Goal: 2 Pain Intervention(s): Medication (See eMAR)  Pt received in bed and declined a shower this am. She just requested to change clothing. Pt dressed with no A needed.  Pt discussed her desire to go home tomorrow as she is feeling physically ready. Her granddaughter can pick her up tomorrow.  No family ed needed.  Will discuss with team.  Pt transferred out of bed with RW with no A  to sit in w/c. Pt completed oral care at sink,  Pt worked on [employer] using red theraband.  Pt worked on B arm pulls at chest level and over head, tricep presses.  Also used looped belt for isometric sh presses. Discussed her L elbow pain, modified exercises to not aggravate elbow discomfort.  Will provide pt with printed HEP next session.  Pt requested to lay back down as she was feeling tired. Pt transferred back to bed with all needs met.     Visit 2: Pain: no c/o pain.  Pt received in bed, able to get out of bed to wc with no A with RW.  Pt self propelled w/c to ADL apt.  Discussed and practiced various strategies of how she can manage in kitchen when she uses either the RW or a wc.  Reviewed safety precautions with RW (ie never trying to reach to her L to fridge or counters as that will cause her to put too much weight onLLE, she will need to rotate her body to reach from R).  Also discussed this  precaution with making her bed or doing housekeeping tasks.  Pt practiced reaching into fridge from wc level.  She has a Secondary school teacher at home she can use if needed. Suggested she place a tray on her lap to hold items.  Discussed easy to make meals to avoid having to do too many steps at once.  Problem solved how to clean up a spill on the floor, she can toss paper towels on spill and use broom to sweep them to the side until her family arrives to finish cleaning up.  She self propelled back to her room in wc.  Pt provided with level 4 green band and printed HEP. Pt practiced the exercises a second time then got back into the bed. Pt is now mod I in the room and prepared for discharge tomorrow.     Therapy Documentation Precautions:  Precautions Precautions: Fall Required Braces or Orthoses: Other Brace Other Brace: Bledsoe Brace 0 to 30 degrees at all times (can be off for shower and in bed) Restrictions Weight Bearing Restrictions: Yes LLE Weight Bearing: Touchdown weight bearing    ADL: ADL Eating: Independent Grooming: Independent Upper Body Bathing: Independent Where Assessed-Upper Body Bathing: Shower Lower Body Bathing: Modified independent Where Assessed-Lower Body Bathing: Shower Upper Body Dressing: Independent Where Assessed-Upper Body Dressing: Edge of bed Lower Body Dressing: Modified independent Where Assessed-Lower Body Dressing: Edge of bed Toileting:  Modified independent Where Assessed-Toileting: Glass blower/designer: Diplomatic Services operational officer Method: Counselling psychologist: Energy manager: Modified independent Clinical cytogeneticist Method: Optometrist: Facilities manager: Modified independent Social research officer, government Method: Heritage manager: Shower seat with back   Therapy/Group: Individual Therapy  Karmyn Lowman 12/30/2021, 12:53 PM

## 2021-12-30 NOTE — Progress Notes (Signed)
PROGRESS NOTE   Subjective/Complaints:  No issues overnite , LLE staples removed by ortho yesterday   ROS: Patient denies CP, SOB, N/V/D   Objective:   No results found. No results for input(s): "WBC", "HGB", "HCT", "PLT" in the last 72 hours.  No results for input(s): "NA", "K", "CL", "CO2", "GLUCOSE", "BUN", "CREATININE", "CALCIUM" in the last 72 hours.   Intake/Output Summary (Last 24 hours) at 12/30/2021 0823 Last data filed at 12/29/2021 2300 Gross per 24 hour  Intake 956 ml  Output --  Net 956 ml         Physical Exam: Vital Signs Blood pressure 115/69, pulse 71, temperature 98.3 F (36.8 C), temperature source Oral, resp. rate 18, height 5\' 5"  (1.651 m), weight 95.9 kg, SpO2 97 %.   General: No acute distress Mood and affect are appropriate Heart: Regular rate and rhythm no rubs murmurs or extra sounds Lungs: Clear to auscultation, breathing unlabored, no rales or wheezes Abdomen: Positive bowel sounds, soft nontender to palpation, nondistended Extremities: No clubbing, cyanosis, or edema   Skin: No evidence of breakdown, no evidence of rash. Incisions cdi Neurologic: Cranial nerves II through XII intact, motor strength is 5/5 in bilateral deltoid, bicep, tricep, grip, RIght hip flexor, knee extensors, Bilat ankle dorsiflexor and plantar flexor Left HF 4-, KE at least antigravity but limited by Bledsoe brace --no change Sensation to LT intact B feet Musculoskeletal: Full range of motion in all 4 extremities where testable.  Assessment/Plan: 1. Functional deficits which require 3+ hours per day of interdisciplinary therapy in a comprehensive inpatient rehab setting. Physiatrist is providing close team supervision and 24 hour management of active medical problems listed below. Physiatrist and rehab team continue to assess barriers to discharge/monitor patient progress toward functional and medical  goals  Care Tool:  Bathing    Body parts bathed by patient: Right arm, Left arm, Chest, Abdomen, Front perineal area, Buttocks, Right upper leg, Right lower leg, Face     Body parts n/a: Left upper leg, Left lower leg (covered with bandage and bag)   Bathing assist Assist Level: Contact Guard/Touching assist     Upper Body Dressing/Undressing Upper body dressing   What is the patient wearing?: Pull over shirt    Upper body assist Assist Level: Set up assist    Lower Body Dressing/Undressing Lower body dressing      What is the patient wearing?: Pants, Underwear/pull up, Orthosis     Lower body assist Assist for lower body dressing: Supervision/Verbal cueing     Toileting Toileting    Toileting assist Assist for toileting: Supervision/Verbal cueing     Transfers Chair/bed transfer  Transfers assist     Chair/bed transfer assist level: Contact Guard/Touching assist     Locomotion Ambulation   Ambulation assist      Assist level: Minimal Assistance - Patient > 75% Assistive device: Walker-rolling Max distance: 15   Walk 10 feet activity   Assist     Assist level: Minimal Assistance - Patient > 75% Assistive device: Walker-rolling   Walk 50 feet activity   Assist Walk 50 feet with 2 turns activity did not occur: Safety/medical concerns  Walk 150 feet activity   Assist Walk 150 feet activity did not occur: Safety/medical concerns         Walk 10 feet on uneven surface  activity   Assist Walk 10 feet on uneven surfaces activity did not occur: Safety/medical concerns         Wheelchair     Assist Is the patient using a wheelchair?: Yes Type of Wheelchair: Manual    Wheelchair assist level: Supervision/Verbal cueing Max wheelchair distance: 150    Wheelchair 50 feet with 2 turns activity    Assist        Assist Level: Supervision/Verbal cueing   Wheelchair 150 feet activity     Assist       Assist Level: Supervision/Verbal cueing   Blood pressure 115/69, pulse 71, temperature 98.3 F (36.8 C), temperature source Oral, resp. rate 18, height 5\' 5"  (1.651 m), weight 95.9 kg, SpO2 97 %.  Medical Problem List and Plan: 1. Functional deficits secondary to periprosthetic left distal femur fracture.S/P ORIF distal femur fracture 11/22. TDWB x 6 weeks with Bledsoe brace 0-30 degrees at all times             -patient may shower with bledsoe removed and leg carefully supported. Bledsoe can be reapplied once leg is dried off             -ELOS/Goals: 7-10 days, mod I goals  -Continue CIR therapies including PT, OT ,    2.  Antithrombotics: -DVT/anticoagulation:  Pharmaceutical: Eliquis x 4 weeks .Check vascular study             -antiplatelet therapy: N/A 3. Pain Management: Hydrocodone and Robaxin as needed             -Ice prn for pain/spasms Left lateral elbow pain , exam consistent with epicondylitis but given fall will check xray  12/4 elbow xray with arthritis. Reviewed findings with pt again today 4. Mood/Behavior/Sleep: Zoloft 100 mg daily, melatonin as needed.  Provide emotional support             -antipsychotic agents: N/A 5. Neuropsych/cognition: This patient is capable of making decisions on her own behalf. 6. Skin/Wound Care: Routine skin checks 7. Fluids/Electrolytes/Nutrition: Routine in and outs with follow-up chemistries on admit 8.Acute blood loss anemia.              -hgb up to 9.1 12/1  -no signs of gross bleeding 9.  Hypertension.  Toprol-XL 25 mg daily.  Monitor with increased mobility             -bp well controlled 12/2 10.  Hyperlipidemia.  Lipitor 11.  Tobacco abuse.  Provide counseling 12.  Constipation.  MiraLAX daily             -had medium bm 12/2 13.  History of asthma.  Resume albuterol inhaler as needed  14.Obesity.BMI 35.95 15.  GERD.  Protonix will increase to BID 16. Urinary urgency and incontinence:  -sudden onset. Don't see any  pharmaceutical cause  -UA is negative UCX negative  12/4 improvement with myrbetriq-->continue until discharge    LOS: 6 days A FACE TO FACE EVALUATION WAS PERFORMED  14/4 12/30/2021, 8:23 AM

## 2021-12-30 NOTE — Progress Notes (Signed)
Patient ID: Meghan Bowman, female   DOB: 05/09/1960, 61 y.o.   MRN: [id number]  Team Conference Report to Patient/Family  Team Conference discussion was reviewed with the patient and caregiver, including goals, any changes in plan of care and target discharge date.  Patient and caregiver express understanding and are in agreement.  The patient has a target discharge date of 12/31/21.  SW met with patient in room and provided team conference updates. Patient prefers [place] therapy, SW will attempt. Patient has transportation for d/c. No additional questions or concerns.   [name] 12/30/2021, 2:35 PM

## 2021-12-30 NOTE — Progress Notes (Signed)
Physical Therapy Discharge Summary  Patient Details  Name: Meghan Bowman MRN: [id number] Date of Birth: 11-12-1960  Date of Discharge from PT service:December 30, 2021  Today's Date: 12/30/2021 PT Individual Time: 0930-0959 PT Individual Time Calculation (min): 29 min    Patient has met 7 of 8 long term goals due to improved activity tolerance, improved balance, increased strength, decreased pain, improved awareness, and improved coordination.  Patient to discharge at an ambulatory level Supervision.   Patient's care partner is independent to provide the necessary physical assistance at discharge.  Reasons goals not met: One LTG deemed adequate for d/c as pt remains at TDWB at time of d/c. Will require use of shower seat to complete steps to enter/ exit home. Requires another person to adjust seat for each step.   Recommendation:  Patient will benefit from ongoing skilled PT services in home health setting to continue to advance safe functional mobility, address ongoing impairments in strength, coordination, balance, activity tolerance, cognition, safety awareness, and to minimize fall risk.  Equipment: Civil engineer, contracting, RW  Reasons for discharge: treatment goals met and discharge from hospital  Patient/family agrees with progress made and goals achieved: Yes  PT Discharge Precautions/Restrictions Precautions Precautions: Fall Required Braces or Orthoses: Other Brace Other Brace: Bledsoe Brace 0 to 30 degrees at all times (can be off for shower and in bed) Restrictions Weight Bearing Restrictions: Yes LLE Weight Bearing: Touchdown weight bearing Vital Signs   Pain Pain Assessment Pain Scale: 0-10 Pain Score: 2  Pain Type: Acute pain;Surgical pain Pain Location: Leg Pain Orientation: Left Pain Descriptors / Indicators: Aching;Discomfort Pain Onset: Progressive Patients Stated Pain Goal: 0 Pain Interference Pain Interference Pain Effect on Sleep: 2. Occasionally;1. Rarely  or not at all Pain Interference with Therapy Activities: 1. Rarely or not at all Pain Interference with Day-to-Day Activities: 2. Occasionally Vision/Perception  Vision - History Ability to See in Adequate Light: 0 Adequate Perception Perception: Within Functional Limits Praxis Praxis: Intact  Cognition Overall Cognitive Status: Within Functional Limits for tasks assessed Arousal/Alertness: Awake/alert Orientation Level: Oriented X4 Memory: Appears intact Awareness: Appears intact Problem Solving: Appears intact Safety/Judgment: Appears intact Sensation Sensation Light Touch: Appears Intact Coordination Gross Motor Movements are Fluid and Coordinated: Yes Fine Motor Movements are Fluid and Coordinated: Yes Heel Shin Test: unable to perform on the LLE due to beldsoe locked 0-30deg Motor  Motor Motor: Within Functional Limits  Mobility Bed Mobility Bed Mobility: Rolling Right;Rolling Left;Supine to Sit;Sit to Supine Rolling Right: Independent Rolling Left: Independent Supine to Sit: Independent;Independent with assistive device Sit to Supine: Independent Transfers Transfers: Sit to Stand;Stand to Sit;Stand Pivot Transfers;Squat Pivot Transfers Sit to Stand: Independent with assistive device Stand to Sit: Independent with assistive device Stand Pivot Transfers: Independent with assistive device Squat Pivot Transfers: Independent with assistive device Transfer (Assistive device): Rolling walker Locomotion  Gait Ambulation: Yes Gait Assistance: Supervision/Verbal cueing;Independent with assistive device Gait Distance (Feet): 75 Feet Assistive device: Rolling walker Gait Gait: Yes Gait Pattern: Impaired Gait Pattern: Antalgic Gait velocity: decreased Stairs / Additional Locomotion Stairs: Yes Stairs Assistance: Minimal Assistance - Patient > 75% (Requires use of shower stool in order to ascend/ descend stairs d/t TDWB status) Stair Management Technique: Two  rails Number of Stairs: 12 Height of Stairs: 6 Ramp: Supervision/Verbal cueing Curb: Nurse, mental health Mobility: Yes Wheelchair Assistance: Independent with Camera operator: Both upper extremities Wheelchair Parts Management: Supervision/cueing Distance: >200 ft  Trunk/Postural Assessment  Cervical Assessment Cervical Assessment: Within  Functional Limits Thoracic Assessment Thoracic Assessment: Exceptions to [facility] (rounded shoulders) Lumbar Assessment Lumbar Assessment: Within Functional Limits Postural Control Postural Control: Within Functional Limits  Balance Balance Balance Assessed: Yes Static Sitting Balance Static Sitting - Level of Assistance: 7: Independent Dynamic Sitting Balance Dynamic Sitting - Level of Assistance: 7: Independent Static Standing Balance Static Standing - Level of Assistance: 6: Modified independent (Device/Increase time) Dynamic Standing Balance Dynamic Standing - Balance Support: Bilateral upper extremity supported Dynamic Standing - Level of Assistance: 5: Stand by assistance Extremity Assessment      RLE Assessment RLE Assessment: Within Functional Limits General Strength Comments: grossly 4+/5 to 5/5 ankle to hip LLE Assessment LLE Assessment: Exceptions to [facility] Passive Range of Motion (PROM) Comments: bledsoe brace General Strength Comments: at least 4/5 L hip and ankle. knee locked in extension  Skilled Intervention: Patient supine in bed on entrance to room. Patient alert and agreeable to PT session. Relates having received muscle relaxer which is helping pain.  Patient with no pain complaint at start of session.  Therapeutic Activity: Bed Mobility: Pt performed supine <> sit with IND and no use of bed features. Transfers: Pt performed sit<>stand and stand pivot transfers throughout session with Mod I. Demos safe use of RW for mobility with good balance while adhering  to WB restrictions.  Gait Training:  Pt ambulated 100 ft using RW with Mod I for first 36' and then w/c follow with close supervision for final 25' for safety and fatigue. Demonstrated good adherence to TDWB.   Wheelchair Mobility:  Pt propelled wheelchair >200 feet with Mod I. No cueing required for technique. Pt is also able to manage leg rests with minimal cueing and brakes with no cues.   Patient supine in bed at end of session with brakes locked, bed alarm set, and all needs within reach.   Alger Simons PT, DPT, CSRS 12/30/2021, 10:52 AM

## 2021-12-30 NOTE — Progress Notes (Signed)
Occupational Therapy Note  Patient Details  Name: Meghan Bowman MRN: [id number] Date of Birth: Oct 24, 1960  Today's Date: 12/30/2021 OT Missed Time: 60 Minutes Missed Time Reason: Patient fatigue  Pt received asleep in supine, pt politely declined session d/t fatigue.    Pollyann Glen [facility] 12/30/2021, 3:49 PM

## 2021-12-30 NOTE — Plan of Care (Signed)
  Problem: RH Stairs Goal: LTG Patient will ambulate up and down stairs w/assist (PT) Description: LTG: Patient will ambulate up and down # of stairs with assistance (PT) Outcome: Adequate for Discharge Flowsheets Taken 12/30/2021 1650 by Alger Simons, PT LTG: Pt will ambulate up/down stairs assist needed:: Minimal Assistance - Patient > 75% Taken 12/25/2021 1423 by Lorie Phenix, PT LTG: Pt will  ambulate up and down number of stairs: 3 steps with 2 rails for support Note: To maintain WB status, pt requires use of shower stool to complete ascent/ descent of steps.    Problem: RH Balance Goal: LTG Patient will maintain dynamic standing balance (PT) Description: LTG:  Patient will maintain dynamic standing balance with assistance during mobility activities (PT) Outcome: Completed/Met Flowsheets (Taken 12/25/2021 1423 by Lorie Phenix, PT) LTG: Pt will maintain dynamic standing balance during mobility activities with:: Independent with assistive device    Problem: RH Bed Mobility Goal: LTG Patient will perform bed mobility with assist (PT) Description: LTG: Patient will perform bed mobility with assistance, with/without cues (PT). Outcome: Completed/Met Flowsheets (Taken 12/30/2021 1650) LTG: Pt will perform bed mobility with assistance level of: Independent   Problem: RH Bed to Chair Transfers Goal: LTG Patient will perform bed/chair transfers w/assist (PT) Description: LTG: Patient will perform bed to chair transfers with assistance (PT). Outcome: Completed/Met Flowsheets (Taken 12/25/2021 1423 by Lorie Phenix, PT) LTG: Pt will perform Bed to Chair Transfers with assistance level: Independent with assistive device    Problem: RH Furniture Transfers Goal: LTG Patient will perform furniture transfers w/assist (OT/PT) Description: LTG: Patient will perform furniture transfers  with assistance (OT/PT). Outcome: Completed/Met Flowsheets (Taken 12/25/2021 1423 by Lorie Phenix,  PT) LTG: Pt will perform furniture transfers with assist:: Independent with assistive device    Problem: RH Ambulation Goal: LTG Patient will ambulate in controlled environment (PT) Description: LTG: Patient will ambulate in a controlled environment, # of feet with assistance (PT). Outcome: Completed/Met Flowsheets (Taken 12/25/2021 1423 by Lorie Phenix, PT) LTG: Pt will ambulate in controlled environ  assist needed:: Independent with assistive device LTG: Ambulation distance in controlled environment: 148f with LRAD Goal: LTG Patient will ambulate in home environment (PT) Description: LTG: Patient will ambulate in home environment, # of feet with assistance (PT). Outcome: Completed/Met Flowsheets (Taken 12/25/2021 1423 by TLorie Phenix PT) LTG: Pt will ambulate in home environ  assist needed:: Independent with assistive device LTG: Ambulation distance in home environment: 516fwith LRAD   Problem: RH Wheelchair Mobility Goal: LTG Patient will propel w/c in controlled environment (PT) Description: LTG: Patient will propel wheelchair in controlled environment, # of feet with assist (PT) Outcome: Completed/Met Flowsheets (Taken 12/25/2021 1423 by TuLorie PhenixPT) LTG: Pt will propel w/c in controlled environ  assist needed:: Independent with assistive device LTG: Propel w/c distance in controlled environment: 15079f

## 2021-12-31 ENCOUNTER — Other Ambulatory Visit (HOSPITAL_COMMUNITY): Payer: Self-pay

## 2021-12-31 MED ORDER — NICOTINE 21 MG/24HR TD PT24
MEDICATED_PATCH | TRANSDERMAL | 0 refills | Status: AC
  Filled 2021-12-31: qty 28, 21d supply, fill #0

## 2021-12-31 MED ORDER — APIXABAN 2.5 MG PO TABS
2.5000 mg | ORAL_TABLET | Freq: Two times a day (BID) | ORAL | 0 refills | Status: DC
  Filled 2021-12-31: qty 32, 16d supply, fill #0

## 2021-12-31 MED ORDER — SERTRALINE HCL 100 MG PO TABS
100.0000 mg | ORAL_TABLET | Freq: Every day | ORAL | 0 refills | Status: AC
  Filled 2021-12-31: qty 30, 30d supply, fill #0

## 2021-12-31 MED ORDER — VITAMIN D (ERGOCALCIFEROL) 1.25 MG (50000 UNIT) PO CAPS
50000.0000 [IU] | ORAL_CAPSULE | ORAL | 0 refills | Status: AC
  Filled 2021-12-31: qty 5, 35d supply, fill #0

## 2021-12-31 MED ORDER — HYDROCODONE-ACETAMINOPHEN 10-325 MG PO TABS
1.0000 | ORAL_TABLET | Freq: Four times a day (QID) | ORAL | 0 refills | Status: DC | PRN
  Filled 2021-12-31: qty 30, 8d supply, fill #0

## 2021-12-31 MED ORDER — DICLOFENAC SODIUM 1 % EX GEL
2.0000 g | Freq: Four times a day (QID) | CUTANEOUS | 0 refills | Status: AC
  Filled 2021-12-31: qty 100, 30d supply, fill #0

## 2021-12-31 MED ORDER — METHOCARBAMOL 500 MG PO TABS
500.0000 mg | ORAL_TABLET | Freq: Four times a day (QID) | ORAL | 0 refills | Status: DC | PRN
  Filled 2021-12-31: qty 60, 15d supply, fill #0

## 2021-12-31 MED ORDER — METOPROLOL SUCCINATE ER 25 MG PO TB24
25.0000 mg | ORAL_TABLET | Freq: Every day | ORAL | 0 refills | Status: AC
  Filled 2021-12-31: qty 30, 30d supply, fill #0

## 2021-12-31 NOTE — Plan of Care (Signed)
  Problem: RH Balance Goal: LTG Patient will maintain dynamic standing with ADLs (OT) Description: LTG:  Patient will maintain dynamic standing balance with assist during activities of daily living (OT)  Outcome: Completed/Met   Problem: Sit to Stand Goal: LTG:  Patient will perform sit to stand in prep for activites of daily living with assistance level (OT) Description: LTG:  Patient will perform sit to stand in prep for activites of daily living with assistance level (OT) Outcome: Completed/Met   Problem: RH Grooming Goal: LTG Patient will perform grooming w/assist,cues/equip (OT) Description: LTG: Patient will perform grooming with assist, with/without cues using equipment (OT) Outcome: Completed/Met   Problem: RH Bathing Goal: LTG Patient will bathe all body parts with assist levels (OT) Description: LTG: Patient will bathe all body parts with assist levels (OT) Outcome: Completed/Met   Problem: RH Dressing Goal: LTG Patient will perform upper body dressing (OT) Description: LTG Patient will perform upper body dressing with assist, with/without cues (OT). Outcome: Completed/Met Goal: LTG Patient will perform lower body dressing w/assist (OT) Description: LTG: Patient will perform lower body dressing with assist, with/without cues in positioning using equipment (OT) Outcome: Completed/Met   Problem: RH Toileting Goal: LTG Patient will perform toileting task (3/3 steps) with assistance level (OT) Description: LTG: Patient will perform toileting task (3/3 steps) with assistance level (OT)  Outcome: Completed/Met   Problem: RH Simple Meal Prep Goal: LTG Patient will perform simple meal prep w/assist (OT) Description: LTG: Patient will perform simple meal prep with assistance, with/without cues (OT). Outcome: Completed/Met   Problem: RH Light Housekeeping Goal: LTG Patient will perform light housekeeping w/assist (OT) Description: LTG: Patient will perform light housekeeping  with assistance, with/without cues (OT). Outcome: Completed/Met   Problem: RH Toilet Transfers Goal: LTG Patient will perform toilet transfers w/assist (OT) Description: LTG: Patient will perform toilet transfers with assist, with/without cues using equipment (OT) Outcome: Completed/Met   Problem: RH Tub/Shower Transfers Goal: LTG Patient will perform tub/shower transfers w/assist (OT) Description: LTG: Patient will perform tub/shower transfers with assist, with/without cues using equipment (OT) Outcome: Completed/Met

## 2021-12-31 NOTE — Progress Notes (Signed)
Inpatient Rehabilitation Discharge Medication Review by a Pharmacist  A complete drug regimen review was completed for this patient to identify any potential clinically significant medication issues.  High Risk Drug Classes Is patient taking? Indication by Medication  Antipsychotic No   Anticoagulant Yes Apixaban - VTE ppx  Antibiotic No   Opioid Yes Norco - PRN pain   Antiplatelet No   Hypoglycemics/insulin No   Vasoactive Medication Yes Metoprolol - HTN Nitroglycerin - PRN CP  Chemotherapy No   Other Yes Albuterol, Symbicort- PRN SOB Atorvastatin - HLD Diclofenac - pain Flonase, Xyzal - PRN allergies Meclizine - PRN dizziness Methocarbamol - PRN muscle spasms Omeprazole - GERD ppx Nicotine patch - smoking cessation  Miralax - PRN constipation Sertraline - mood Tobramycin ophthalmic soln - infection ppx Vit D, MVI - supplement     Type of Medication Issue Identified Description of Issue Recommendation(s)  Drug Interaction(s) (clinically significant)     Duplicate Therapy     Allergy     No Medication Administration End Date     Incorrect Dose     Additional Drug Therapy Needed     Significant med changes from prior encounter (inform family/care partners about these prior to discharge). Amlodipine, pregabalin, kcl, oxycodone,  discontinued  Communicate medication changes with patient/family at discharge  Other       Clinically significant medication issues were identified that warrant physician communication and completion of prescribed/recommended actions by midnight of the next day:  No  Pharmacist comments:  Eliquis 2.5 mg BID x 28 days for VTE ppx  started 11/27, end 12/25   Time spent performing this drug regimen review (minutes): 20   Thank you for allowing pharmacy to be a part of this patient's care.  Thelma Barge, PharmD Clinical Pharmacist

## 2021-12-31 NOTE — Progress Notes (Signed)
PROGRESS NOTE   Subjective/Complaints:  Pt concerned that her regular pharmacy does not have hydrocodone in stock   ROS: Patient denies CP, SOB, N/V/D   Objective:   No results found. No results for input(s): "WBC", "HGB", "HCT", "PLT" in the last 72 hours.  No results for input(s): "NA", "K", "CL", "CO2", "GLUCOSE", "BUN", "CREATININE", "CALCIUM" in the last 72 hours.   Intake/Output Summary (Last 24 hours) at 12/31/2021 0756 Last data filed at 12/30/2021 2015 Gross per 24 hour  Intake 240 ml  Output --  Net 240 ml         Physical Exam: Vital Signs Blood pressure 114/76, pulse 64, temperature 97.6 F (36.4 C), resp. rate 18, height 5\' 5"  (1.651 m), weight 95.9 kg, SpO2 97 %.   General: No acute distress Mood and affect are appropriate Heart: Regular rate and rhythm no rubs murmurs or extra sounds Lungs: Clear to auscultation, breathing unlabored, no rales or wheezes Abdomen: Positive bowel sounds, soft nontender to palpation, nondistended Extremities: No clubbing, cyanosis, or edema   Skin: No evidence of breakdown, no evidence of rash. Incisions cdi Neurologic: Cranial nerves II through XII intact, motor strength is 5/5 in bilateral deltoid, bicep, tricep, grip, RIght hip flexor, knee extensors, Bilat ankle dorsiflexor and plantar flexor Left HF 4-, KE at least antigravity but limited by Bledsoe brace --no change Sensation to LT intact B feet Musculoskeletal: Full range of motion in all 4 extremities where testable.  Assessment/Plan: 1. Functional deficits due to left distal femur fx Stable for D/C today F/u PCP on Monday has appt  F/u ortho 1-2 weeks See D/C summary See D/C instructions  Care Tool:  Bathing    Body parts bathed by patient: Right arm, Left arm, Chest, Abdomen, Front perineal area, Buttocks, Right upper leg, Right lower leg, Face, Left upper leg, Left lower leg     Body parts n/a:  Left upper leg, Left lower leg (covered with bandage and bag)   Bathing assist Assist Level: Independent with assistive device     Upper Body Dressing/Undressing Upper body dressing   What is the patient wearing?: Pull over shirt    Upper body assist Assist Level: Independent    Lower Body Dressing/Undressing Lower body dressing      What is the patient wearing?: Pants, Underwear/pull up, Orthosis     Lower body assist Assist for lower body dressing: Independent with assitive device     Toileting Toileting    Toileting assist Assist for toileting: Independent with assistive device     Transfers Chair/bed transfer  Transfers assist     Chair/bed transfer assist level: Independent with assistive device Chair/bed transfer assistive device: Thursday   Ambulation assist      Assist level: Minimal Assistance - Patient > 75% Assistive device: Walker-rolling Max distance: 15   Walk 10 feet activity   Assist     Assist level: Minimal Assistance - Patient > 75% Assistive device: Walker-rolling   Walk 50 feet activity   Assist Walk 50 feet with 2 turns activity did not occur: Safety/medical concerns         Walk 150 feet activity  Assist Walk 150 feet activity did not occur: Safety/medical concerns         Walk 10 feet on uneven surface  activity   Assist Walk 10 feet on uneven surfaces activity did not occur: Safety/medical concerns         Wheelchair     Assist Is the patient using a wheelchair?: Yes Type of Wheelchair: Manual    Wheelchair assist level: Supervision/Verbal cueing Max wheelchair distance: 150    Wheelchair 50 feet with 2 turns activity    Assist        Assist Level: Supervision/Verbal cueing   Wheelchair 150 feet activity     Assist      Assist Level: Supervision/Verbal cueing   Blood pressure 114/76, pulse 64, temperature 97.6 F (36.4 C), resp. rate 18, height 5'  5" (1.651 m), weight 95.9 kg, SpO2 97 %.  Medical Problem List and Plan: 1. Functional deficits secondary to periprosthetic left distal femur fracture.S/P ORIF distal femur fracture 11/22. TDWB x 6 weeks with Bledsoe brace 0-30 degrees at all times             -patient may shower with bledsoe removed and leg carefully supported. Bledsoe can be reapplied once leg is dried off             -d/c home today   -with have ortho and PCP f/u , no PMR f/u needed  2.  Antithrombotics: -DVT/anticoagulation:  Pharmaceutical: Eliquis x 4 weeks .Check vascular study             -antiplatelet therapy: N/A 3. Pain Management: Hydrocodone and Robaxin as needed             -Ice prn for pain/spasms Left lateral elbow pain , exam consistent with epicondylitis but given fall will check xray  12/4 elbow xray with arthritis.  Change hydrocodone to 10mg  QID prn, 1 mo supply any subsequent refills per ortho or PCP 4. Mood/Behavior/Sleep: Zoloft 100 mg daily, melatonin as needed.  Provide emotional support             -antipsychotic agents: N/A 5. Neuropsych/cognition: This patient is capable of making decisions on her own behalf. 6. Skin/Wound Care: Routine skin checks 7. Fluids/Electrolytes/Nutrition: Routine in and outs with follow-up chemistries on admit 8.Acute blood loss anemia.              -hgb up to 9.1 12/1  -no signs of gross bleeding 9.  Hypertension.  Toprol-XL 25 mg daily.  Monitor with increased mobility             -bp well controlled 12/2 10.  Hyperlipidemia.  Lipitor 11.  Tobacco abuse.  Provide counseling 12.  Constipation.  MiraLAX daily             -had medium bm 12/2 13.  History of asthma.  Resume albuterol inhaler as needed  14.Obesity.BMI 35.95 15.  GERD.  Protonix will increase to BID 16. Urinary urgency and incontinence:  -sudden onset. Don't see any pharmaceutical cause  -UA is negative UCX negative  12/4 improvement with myrbetriq-->continue until discharge    LOS: 7 days A  FACE TO FACE EVALUATION WAS PERFORMED  14/4 12/31/2021, 7:56 AM

## 2021-12-31 NOTE — Progress Notes (Signed)
Inpatient Rehabilitation Care Coordinator Discharge Note   Patient Details  Name: Meghan Bowman MRN: [id number] Date of Birth: 1960-08-11   Discharge location: Home  Length of Stay: 7 Days  Discharge activity level: ambulatory sup  Home/community participation: daughter  Patient response [id number] Literacy - How often do you need to have someone help you when you read instructions, pamphlets, or other written material from your doctor or pharmacy?: Never  Patient response [id number] Isolation - How often do you feel lonely or isolated from those around you?: Never  Services provided included: MD, RD, OT, PT, SLP, RN, CM, TR, Pharmacy, Neuropsych, SW  Financial Services:  Field seismologist Utilized: Private Insurance [facility] Medicaid  Choices offered to/list presented to:    Follow-up services arranged:  Home Health Home Health Agency: TBD upn insurance approval         Patient response to transportation need: Is the patient able to respond to transportation needs?: Yes In the past 12 months, has lack of transportation kept you from medical appointments or from getting medications?: No In the past 12 months, has lack of transportation kept you from meetings, work, or from getting things needed for daily living?: No    Comments (or additional information):  Patient/Family verbalized understanding of follow-up arrangements:  Yes  Individual responsible for coordination of the follow-up plan: self  Confirmed correct DME delivered: Andria Rhein 12/31/2021    Andria Rhein

## 2022-01-07 ENCOUNTER — Other Ambulatory Visit (HOSPITAL_COMMUNITY): Payer: Self-pay

## 2022-01-07 MED ORDER — HYDROCODONE-ACETAMINOPHEN 10-325 MG PO TABS
0.5000 | ORAL_TABLET | Freq: Four times a day (QID) | ORAL | 0 refills | Status: DC | PRN
  Filled 2022-01-07: qty 28, 7d supply, fill #0

## 2022-01-08 ENCOUNTER — Other Ambulatory Visit (HOSPITAL_COMMUNITY): Payer: Self-pay

## 2022-01-14 ENCOUNTER — Other Ambulatory Visit (HOSPITAL_COMMUNITY): Payer: Self-pay

## 2022-01-14 MED ORDER — HYDROCODONE-ACETAMINOPHEN 10-325 MG PO TABS
0.5000 | ORAL_TABLET | Freq: Four times a day (QID) | ORAL | 0 refills | Status: DC | PRN
  Filled 2022-01-14: qty 21, 6d supply, fill #0

## 2022-01-15 ENCOUNTER — Other Ambulatory Visit (HOSPITAL_COMMUNITY): Payer: Self-pay
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
## 2022-02-26 ENCOUNTER — Ambulatory Visit: Payer: Medicaid Other | Admitting: Physical Therapy

## 2022-03-17 ENCOUNTER — Ambulatory Visit: Payer: Medicaid Other | Attending: Orthopedic Surgery | Admitting: Physical Therapy

## 2022-03-17 ENCOUNTER — Other Ambulatory Visit: Payer: Self-pay

## 2022-03-17 ENCOUNTER — Encounter: Payer: Self-pay | Admitting: Physical Therapy

## 2022-03-17 DIAGNOSIS — R600 Localized edema: Secondary | ICD-10-CM | POA: Insufficient documentation

## 2022-03-17 DIAGNOSIS — R2681 Unsteadiness on feet: Secondary | ICD-10-CM | POA: Diagnosis present

## 2022-03-17 DIAGNOSIS — M25562 Pain in left knee: Secondary | ICD-10-CM | POA: Insufficient documentation

## 2022-03-17 DIAGNOSIS — G8929 Other chronic pain: Secondary | ICD-10-CM | POA: Diagnosis present

## 2022-03-17 DIAGNOSIS — M6281 Muscle weakness (generalized): Secondary | ICD-10-CM | POA: Insufficient documentation
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
## 2022-03-27 ENCOUNTER — Ambulatory Visit: Payer: Medicaid Other | Attending: Orthopedic Surgery

## 2022-03-27 DIAGNOSIS — M6281 Muscle weakness (generalized): Secondary | ICD-10-CM | POA: Insufficient documentation

## 2022-03-27 DIAGNOSIS — M25562 Pain in left knee: Secondary | ICD-10-CM | POA: Diagnosis present

## 2022-03-27 DIAGNOSIS — G8929 Other chronic pain: Secondary | ICD-10-CM | POA: Diagnosis present

## 2022-03-27 DIAGNOSIS — R2681 Unsteadiness on feet: Secondary | ICD-10-CM | POA: Diagnosis present

## 2022-03-27 DIAGNOSIS — R600 Localized edema: Secondary | ICD-10-CM | POA: Diagnosis present
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
## 2022-03-29 ENCOUNTER — Encounter: Payer: Self-pay | Admitting: Physical Therapy

## 2022-03-29 ENCOUNTER — Ambulatory Visit: Payer: Medicaid Other | Admitting: Physical Therapy

## 2022-03-29 DIAGNOSIS — M25562 Pain in left knee: Secondary | ICD-10-CM | POA: Diagnosis not present

## 2022-03-29 DIAGNOSIS — M6281 Muscle weakness (generalized): Secondary | ICD-10-CM

## 2022-03-29 DIAGNOSIS — R2681 Unsteadiness on feet: Secondary | ICD-10-CM

## 2022-03-29 DIAGNOSIS — R600 Localized edema: Secondary | ICD-10-CM

## 2022-03-29 DIAGNOSIS — G8929 Other chronic pain: Secondary | ICD-10-CM
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
## 2022-03-31 ENCOUNTER — Encounter: Payer: Self-pay | Admitting: Physical Therapy

## 2022-03-31 ENCOUNTER — Ambulatory Visit: Payer: Medicaid Other | Admitting: Physical Therapy

## 2022-03-31 DIAGNOSIS — M6281 Muscle weakness (generalized): Secondary | ICD-10-CM

## 2022-03-31 DIAGNOSIS — M25562 Pain in left knee: Secondary | ICD-10-CM | POA: Diagnosis not present

## 2022-03-31 DIAGNOSIS — R2681 Unsteadiness on feet: Secondary | ICD-10-CM

## 2022-03-31 DIAGNOSIS — R600 Localized edema: Secondary | ICD-10-CM

## 2022-03-31 DIAGNOSIS — G8929 Other chronic pain: Secondary | ICD-10-CM
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
## 2022-04-05 ENCOUNTER — Ambulatory Visit: Payer: Medicaid Other | Admitting: Physical Therapy

## 2022-04-05 ENCOUNTER — Encounter: Payer: Self-pay | Admitting: Physical Therapy

## 2022-04-05 DIAGNOSIS — M25562 Pain in left knee: Secondary | ICD-10-CM | POA: Diagnosis not present

## 2022-04-05 DIAGNOSIS — G8929 Other chronic pain: Secondary | ICD-10-CM

## 2022-04-05 DIAGNOSIS — R600 Localized edema: Secondary | ICD-10-CM

## 2022-04-05 DIAGNOSIS — M6281 Muscle weakness (generalized): Secondary | ICD-10-CM

## 2022-04-05 DIAGNOSIS — R2681 Unsteadiness on feet: Secondary | ICD-10-CM
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
## 2022-04-07 ENCOUNTER — Ambulatory Visit: Payer: Medicaid Other | Admitting: Physical Therapy
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
## 2022-04-13 ENCOUNTER — Ambulatory Visit: Payer: Medicaid Other

## 2022-04-13 DIAGNOSIS — R600 Localized edema: Secondary | ICD-10-CM

## 2022-04-13 DIAGNOSIS — M25562 Pain in left knee: Secondary | ICD-10-CM | POA: Diagnosis not present

## 2022-04-13 DIAGNOSIS — M6281 Muscle weakness (generalized): Secondary | ICD-10-CM

## 2022-04-13 DIAGNOSIS — G8929 Other chronic pain: Secondary | ICD-10-CM

## 2022-04-13 DIAGNOSIS — R2681 Unsteadiness on feet: Secondary | ICD-10-CM
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
## 2022-04-15 ENCOUNTER — Ambulatory Visit: Payer: Medicaid Other

## 2022-04-15 DIAGNOSIS — M6281 Muscle weakness (generalized): Secondary | ICD-10-CM

## 2022-04-15 DIAGNOSIS — G8929 Other chronic pain: Secondary | ICD-10-CM

## 2022-04-15 DIAGNOSIS — R600 Localized edema: Secondary | ICD-10-CM

## 2022-04-15 DIAGNOSIS — M25562 Pain in left knee: Secondary | ICD-10-CM | POA: Diagnosis not present

## 2022-04-15 DIAGNOSIS — R2681 Unsteadiness on feet: Secondary | ICD-10-CM

## 2022-04-21 ENCOUNTER — Ambulatory Visit: Payer: Medicaid Other

## 2022-04-21 DIAGNOSIS — R2681 Unsteadiness on feet: Secondary | ICD-10-CM

## 2022-04-21 DIAGNOSIS — M25562 Pain in left knee: Secondary | ICD-10-CM | POA: Diagnosis not present

## 2022-04-21 DIAGNOSIS — G8929 Other chronic pain: Secondary | ICD-10-CM

## 2022-04-21 DIAGNOSIS — R600 Localized edema: Secondary | ICD-10-CM

## 2022-04-21 DIAGNOSIS — M6281 Muscle weakness (generalized): Secondary | ICD-10-CM
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
## 2022-04-28 ENCOUNTER — Ambulatory Visit: Payer: Medicaid Other | Attending: Orthopedic Surgery

## 2022-04-28 DIAGNOSIS — G8929 Other chronic pain: Secondary | ICD-10-CM

## 2022-04-28 DIAGNOSIS — M6281 Muscle weakness (generalized): Secondary | ICD-10-CM | POA: Insufficient documentation

## 2022-04-28 DIAGNOSIS — R600 Localized edema: Secondary | ICD-10-CM | POA: Diagnosis present

## 2022-04-28 DIAGNOSIS — M25562 Pain in left knee: Secondary | ICD-10-CM | POA: Diagnosis present

## 2022-04-28 DIAGNOSIS — R2681 Unsteadiness on feet: Secondary | ICD-10-CM | POA: Insufficient documentation
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
## 2022-05-04 ENCOUNTER — Ambulatory Visit: Payer: Medicaid Other | Admitting: Physical Therapy

## 2022-05-04 ENCOUNTER — Encounter: Payer: Self-pay | Admitting: Physical Therapy

## 2022-05-04 DIAGNOSIS — M25562 Pain in left knee: Secondary | ICD-10-CM | POA: Diagnosis not present

## 2022-05-04 DIAGNOSIS — G8929 Other chronic pain: Secondary | ICD-10-CM

## 2022-05-04 DIAGNOSIS — R2681 Unsteadiness on feet: Secondary | ICD-10-CM

## 2022-05-04 DIAGNOSIS — R600 Localized edema: Secondary | ICD-10-CM

## 2022-05-04 DIAGNOSIS — M6281 Muscle weakness (generalized): Secondary | ICD-10-CM
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
## 2022-05-05 ENCOUNTER — Ambulatory Visit: Payer: Medicaid Other | Admitting: Physical Therapy

## 2022-05-06 ENCOUNTER — Other Ambulatory Visit (HOSPITAL_COMMUNITY): Payer: Self-pay

## 2022-05-11 ENCOUNTER — Ambulatory Visit: Payer: Medicaid Other | Admitting: Physical Therapy

## 2022-05-26 ENCOUNTER — Encounter: Payer: Self-pay | Admitting: Physical Therapy

## 2022-05-26 ENCOUNTER — Ambulatory Visit: Payer: Medicaid Other | Attending: Orthopedic Surgery | Admitting: Physical Therapy

## 2022-05-26 DIAGNOSIS — G8929 Other chronic pain: Secondary | ICD-10-CM | POA: Diagnosis present

## 2022-05-26 DIAGNOSIS — M25562 Pain in left knee: Secondary | ICD-10-CM | POA: Insufficient documentation

## 2022-05-26 DIAGNOSIS — M6281 Muscle weakness (generalized): Secondary | ICD-10-CM | POA: Insufficient documentation

## 2022-05-26 DIAGNOSIS — R2681 Unsteadiness on feet: Secondary | ICD-10-CM | POA: Insufficient documentation

## 2022-05-26 DIAGNOSIS — R600 Localized edema: Secondary | ICD-10-CM | POA: Insufficient documentation
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
## 2022-06-07 ENCOUNTER — Other Ambulatory Visit: Payer: Self-pay

## 2022-06-07 ENCOUNTER — Emergency Department (HOSPITAL_COMMUNITY)
Admission: EM | Admit: 2022-06-07 | Discharge: 2022-06-07 | Disposition: A | Discharge status: Home / Self Care | Payer: Medicaid Other | Attending: Emergency Medicine | Admitting: Emergency Medicine

## 2022-06-07 DIAGNOSIS — L237 Allergic contact dermatitis due to plants, except food: Secondary | ICD-10-CM

## 2022-06-07 DIAGNOSIS — Z7901 Long term (current) use of anticoagulants: Secondary | ICD-10-CM | POA: Diagnosis not present

## 2022-06-07 DIAGNOSIS — J45909 Unspecified asthma, uncomplicated: Secondary | ICD-10-CM | POA: Insufficient documentation

## 2022-06-07 DIAGNOSIS — R21 Rash and other nonspecific skin eruption: Secondary | ICD-10-CM | POA: Diagnosis present

## 2022-06-07 MED ORDER — FLUOCINOLONE ACETONIDE 0.01 % EX SOLN: Freq: Two times a day (BID) | CUTANEOUS | 0 refills | Status: AC

## 2022-06-07 MED ORDER — PREDNISONE 10 MG (21) PO TBPK: ORAL_TABLET | Freq: Every day | ORAL | 0 refills | Status: DC

## 2022-06-07 NOTE — ED Triage Notes (Signed)
Itchy rash on head, face, under breast and arms x few days pt states rash started after being out in the yard.

## 2022-06-07 NOTE — Discharge Instructions (Signed)
Topical steroids to only over-the-counter hydrocortisone use on the face, do not use for any more than 1 to 2 weeks consecutively, before taking a break as you can have significant skin thinning in thin skin areas such as the face.

## 2022-06-07 NOTE — ED Provider Notes (Signed)
[place] EMERGENCY DEPARTMENT AT [facility] Provider Note   CSN: [id number] Arrival date & time: 06/07/22  1191     History  Chief Complaint  Patient presents with   Rash    Meghan Bowman is a 62 y.o. female noncontributory past medical history presents with concern for rash on face, and hair, and throughout body.  Patient reports that she was doing yard work yesterday, thinks she may have been in contact with poison ivy, poison oak.  Patient denies any throat swelling, fever, chills, difficulty breathing.  She denies any history of asthma, wheezing, chest pain. She has been using home hydrocortisone with mild relief. Endorses quite itchy.   Rash      Home Medications Prior to Admission medications   Medication Sig Start Date End Date Taking? Authorizing Provider  fluocinolone (SYNALAR) 0.01 % external solution Apply topically 2 (two) times daily. For use in hair 06/07/22  Yes Richanda Darin H, PA-C  predniSONE (STERAPRED UNI-PAK 21 TAB) 10 MG (21) TBPK tablet Take by mouth daily. Take 6 tabs by mouth daily  for 2 days, then 5 tabs for 2 days, then 4 tabs for 2 days, then 3 tabs for 2 days, 2 tabs for 2 days, then 1 tab by mouth daily for 2 days 06/07/22  Yes Hattie Aguinaldo H, PA-C  acetaminophen (TYLENOL) 325 MG tablet Take 2 tablets (650 mg total) by mouth every 6 (six) hours as needed for mild pain (or Fever >/= 101). 12/24/21   Lorin Glass, MD  albuterol (VENTOLIN HFA) 108 (90 Base) MCG/ACT inhaler Inhale 2 puffs into the lungs every 6 (six) hours as needed for wheezing or shortness of breath.    [provider]  apixaban (ELIQUIS) 2.5 MG TABS tablet Take 1 tablet (2.5 mg total) by mouth 2 (two) times daily for 16 days. 12/31/21   Angiulli, Mcarthur Rossetti, PA-C  atorvastatin (LIPITOR) 40 MG tablet Take 40 mg by mouth daily. 08/18/18   [provider]  budesonide-formoterol (SYMBICORT) 80-4.5 MCG/ACT inhaler Inhale 2 puffs into the lungs 2 (two)  times daily as needed (wheezing).     [provider]  diclofenac Sodium (VOLTAREN) 1 % GEL Apply 2 g topically 4 (four) times daily. 12/31/21   Angiulli, Mcarthur Rossetti, PA-C  fluticasone (FLONASE) 50 MCG/ACT nasal spray Place 1 spray into both nostrils daily as needed for allergies.  11/27/19   [provider]  HYDROcodone-acetaminophen (NORCO) 10-325 MG tablet Take 0.5-1 tablets by mouth every 6 (six) hours as needed for pain 01/14/22     levocetirizine (XYZAL) 5 MG tablet Take 5 mg by mouth at bedtime.    [provider]  methocarbamol (ROBAXIN) 500 MG tablet Take 1 tablet (500 mg total) by mouth every 6 (six) hours as needed for muscle spasms. 12/31/21   Angiulli, Mcarthur Rossetti, PA-C  metoprolol succinate (TOPROL-XL) 25 MG 24 hr tablet Take 1 tablet (25 mg total) by mouth daily. 12/31/21   Angiulli, Mcarthur Rossetti, PA-C  Multiple Vitamins-Minerals (PRESERVISION AREDS 2+MULTI VIT PO) Take 1 capsule by mouth at bedtime.    [provider]  nicotine (NICODERM CQ - DOSED IN MG/24 HOURS) 21 mg/24hr patch 21 mg patch daily for two weeks then 14 mg patch daily for three weeks then 7 mg patch daily for 3 weeks and stop 12/31/21   Angiulli, Mcarthur Rossetti, PA-C  nitroGLYCERIN (NITROSTAT) 0.4 MG SL tablet Place 0.4 mg under the tongue every 5 (five) minutes as needed for chest  pain.    [provider]  omeprazole (PRILOSEC) 40 MG capsule Take 40 mg by mouth daily.    [provider]  polyethylene glycol (MIRALAX / GLYCOLAX) 17 g packet Take 17 g by mouth daily. 12/25/21   Lorin Glass, MD  sertraline (ZOLOFT) 100 MG tablet Take 1 tablet (100 mg total) by mouth at bedtime. 12/31/21   Angiulli, Mcarthur Rossetti, PA-C  tobramycin (TOBREX) 0.3 % ophthalmic solution Place 1 drop into the left eye See admin instructions. Instill 1 drop to left eye four times a day the day before injection and the day after four times a day 08/26/20   [provider]  Vitamin D, Ergocalciferol, (DRISDOL)  1.25 MG (50000 UNIT) CAPS capsule Take 1 capsule (50,000 Units total) by mouth every 7 (seven) days. 12/31/21   Angiulli, Mcarthur Rossetti, PA-C      Allergies    Ibuprofen, Other, and Tape    Review of Systems   Review of Systems  Skin:  Positive for rash.  All other systems reviewed and are negative.   Physical Exam Updated Vital Signs BP (!) 149/103 (BP Location: Left Arm)   Pulse 85   Temp 97.7 F (36.5 C) (Oral)   Resp 16   Ht 5\' 5"  (1.651 m)   Wt 95.9 kg   SpO2 98%   BMI 35.18 kg/m  Physical Exam Vitals and nursing note reviewed.  Constitutional:      General: She is not in acute distress.    Appearance: Normal appearance.  HENT:     Head: Normocephalic and atraumatic.  Eyes:     General:        Right eye: No discharge.        Left eye: No discharge.  Cardiovascular:     Rate and Rhythm: Normal rate and regular rhythm.  Pulmonary:     Effort: Pulmonary effort is normal. No respiratory distress.  Musculoskeletal:        General: No deformity.  Skin:    General: Skin is warm and dry.     Comments: Patient with diffuse red, macular rash with scattered vesicles on face, and hairline, and on trunk.  No burst vesicles, negative Nikolsky sign, no targetoid lesions.  Neurological:     Mental Status: She is alert and oriented to person, place, and time.  Psychiatric:        Mood and Affect: Mood normal.        Behavior: Behavior normal.     ED Results / Procedures / Treatments   Labs (all labs ordered are listed, but only abnormal results are displayed) Labs Reviewed - No data to display  EKG None  Radiology No results found.  Procedures Procedures    Medications Ordered in ED Medications - No data to display  ED Course/ Medical Decision Making/ A&P                             Medical Decision Making  This patient is a 62 y.o. female who presents to the ED for concern of rash.   Differential diagnoses prior to evaluation: Allergic, contact  dermatitis, viral id reaction, other viral rash, medication reaction, versus other  Past Medical History / Social History / Additional history: Chart reviewed. Pertinent results include: Overall noncontributory  Physical Exam: Physical exam performed. The pertinent findings include: Scattered macular rash with some excoriation secondary to itching, scattered vesicles, no Nikolsky sign, no targetoid lesion,  her vital signs are stable other than mild hypertension, blood pressure 149/103  Medications / Treatment: Findings are consistent with a plant dermatitis, given the involvement of the face, and the overall body surface area I think that it would be reasonable to give her a oral steroid course, as well as discharged with fluocinolone solution for use in the hair   Disposition: After consideration of the diagnostic results and the patients response to treatment, I feel that patient stable for discharge with send symptoms of plantar fasciitis as above, will do oral steroids, topical solution for hair.   emergency department workup does not suggest an emergent condition requiring admission or immediate intervention beyond what has been performed at this time. The plan is: as above. The patient is safe for discharge and has been instructed to return immediately for worsening symptoms, change in symptoms or any other concerns.  Final Clinical Impression(s) / ED Diagnoses Final diagnoses:  Plant allergic contact dermatitis    Rx / DC Orders ED Discharge Orders          Ordered    fluocinolone (SYNALAR) 0.01 % external solution  2 times daily        06/07/22 1005    predniSONE (STERAPRED UNI-PAK 21 TAB) 10 MG (21) TBPK tablet  Daily        06/07/22 1005              Alessander Sikorski, [place], PA-C 06/07/22 1010    Glyn Ade, MD 06/08/22 [phone]

## 2022-06-16 ENCOUNTER — Ambulatory Visit: Payer: Medicaid Other

## 2022-06-24 ENCOUNTER — Inpatient Hospital Stay (HOSPITAL_COMMUNITY)
Admission: EM | Admit: 2022-06-24 | Discharge: 2022-06-26 | DRG: 683 | Disposition: A | Discharge status: Home / Self Care | Payer: Medicaid Other | Attending: Internal Medicine | Admitting: Internal Medicine

## 2022-06-24 ENCOUNTER — Other Ambulatory Visit: Payer: Self-pay

## 2022-06-24 ENCOUNTER — Encounter (HOSPITAL_COMMUNITY): Payer: Self-pay | Admitting: *Deleted

## 2022-06-24 ENCOUNTER — Emergency Department (HOSPITAL_COMMUNITY): Payer: Medicaid Other

## 2022-06-24 DIAGNOSIS — F32A Depression, unspecified: Secondary | ICD-10-CM | POA: Diagnosis present

## 2022-06-24 DIAGNOSIS — Z7951 Long term (current) use of inhaled steroids: Secondary | ICD-10-CM

## 2022-06-24 DIAGNOSIS — E860 Dehydration: Secondary | ICD-10-CM

## 2022-06-24 DIAGNOSIS — Z888 Allergy status to other drugs, medicaments and biological substances status: Secondary | ICD-10-CM

## 2022-06-24 DIAGNOSIS — J4489 Other specified chronic obstructive pulmonary disease: Secondary | ICD-10-CM | POA: Diagnosis present

## 2022-06-24 DIAGNOSIS — F419 Anxiety disorder, unspecified: Secondary | ICD-10-CM | POA: Diagnosis present

## 2022-06-24 DIAGNOSIS — F17210 Nicotine dependence, cigarettes, uncomplicated: Secondary | ICD-10-CM | POA: Diagnosis present

## 2022-06-24 DIAGNOSIS — Z79899 Other long term (current) drug therapy: Secondary | ICD-10-CM

## 2022-06-24 DIAGNOSIS — I2510 Atherosclerotic heart disease of native coronary artery without angina pectoris: Secondary | ICD-10-CM | POA: Diagnosis present

## 2022-06-24 DIAGNOSIS — N179 Acute kidney failure, unspecified: Secondary | ICD-10-CM | POA: Diagnosis not present

## 2022-06-24 DIAGNOSIS — Z7901 Long term (current) use of anticoagulants: Secondary | ICD-10-CM

## 2022-06-24 DIAGNOSIS — K219 Gastro-esophageal reflux disease without esophagitis: Secondary | ICD-10-CM | POA: Diagnosis present

## 2022-06-24 DIAGNOSIS — K529 Noninfective gastroenteritis and colitis, unspecified: Secondary | ICD-10-CM | POA: Diagnosis present

## 2022-06-24 DIAGNOSIS — R112 Nausea with vomiting, unspecified: Principal | ICD-10-CM

## 2022-06-24 DIAGNOSIS — Z8249 Family history of ischemic heart disease and other diseases of the circulatory system: Secondary | ICD-10-CM

## 2022-06-24 DIAGNOSIS — Z1152 Encounter for screening for COVID-19: Secondary | ICD-10-CM

## 2022-06-24 DIAGNOSIS — I10 Essential (primary) hypertension: Secondary | ICD-10-CM | POA: Diagnosis present

## 2022-06-24 DIAGNOSIS — F4310 Post-traumatic stress disorder, unspecified: Secondary | ICD-10-CM | POA: Diagnosis present

## 2022-06-24 DIAGNOSIS — Z96652 Presence of left artificial knee joint: Secondary | ICD-10-CM | POA: Diagnosis present

## 2022-06-24 DIAGNOSIS — E785 Hyperlipidemia, unspecified: Secondary | ICD-10-CM | POA: Diagnosis present

## 2022-06-24 DIAGNOSIS — J4520 Mild intermittent asthma, uncomplicated: Secondary | ICD-10-CM | POA: Diagnosis present

## 2022-06-24 DIAGNOSIS — Z23 Encounter for immunization: Secondary | ICD-10-CM

## 2022-06-24 DIAGNOSIS — J449 Chronic obstructive pulmonary disease, unspecified: Secondary | ICD-10-CM | POA: Diagnosis present

## 2022-06-24 DIAGNOSIS — Z886 Allergy status to analgesic agent status: Secondary | ICD-10-CM

## 2022-06-24 DIAGNOSIS — Z808 Family history of malignant neoplasm of other organs or systems: Secondary | ICD-10-CM

## 2022-06-24 DIAGNOSIS — E872 Acidosis, unspecified: Secondary | ICD-10-CM | POA: Diagnosis present

## 2022-06-24 LAB — CBC
HCT: 48.1 % — ABNORMAL HIGH (ref 36.0–46.0)
Hemoglobin: 15.4 g/dL — ABNORMAL HIGH (ref 12.0–15.0)
MCH: 29.2 pg (ref 26.0–34.0)
MCHC: 32 g/dL (ref 30.0–36.0)
MCV: 91.1 fL (ref 80.0–100.0)
Platelets: 266 10*3/uL (ref 150–400)
RBC: 5.28 MIL/uL — ABNORMAL HIGH (ref 3.87–5.11)
RDW: 12.8 % (ref 11.5–15.5)
WBC: 11.6 10*3/uL — ABNORMAL HIGH (ref 4.0–10.5)
nRBC: 0 % (ref 0.0–0.2)

## 2022-06-24 LAB — COMPREHENSIVE METABOLIC PANEL
ALT: 12 U/L (ref 0–44)
AST: 20 U/L (ref 15–41)
Albumin: 4.6 g/dL (ref 3.5–5.0)
Alkaline Phosphatase: 101 U/L (ref 38–126)
Anion gap: 15 (ref 5–15)
BUN: 17 mg/dL (ref 8–23)
CO2: 12 mmol/L — ABNORMAL LOW (ref 22–32)
Calcium: 9.9 mg/dL (ref 8.9–10.3)
Chloride: 108 mmol/L (ref 98–111)
Creatinine, Ser: 1.83 mg/dL — ABNORMAL HIGH (ref 0.44–1.00)
GFR, Estimated: 31 mL/min — ABNORMAL LOW (ref >60)
Glucose, Bld: 223 mg/dL — ABNORMAL HIGH (ref 70–99)
Potassium: 4 mmol/L (ref 3.5–5.1)
Sodium: 135 mmol/L (ref 135–145)
Total Bilirubin: 0.8 mg/dL (ref 0.3–1.2)
Total Protein: 8.4 g/dL — ABNORMAL HIGH (ref 6.5–8.1)

## 2022-06-24 LAB — URINALYSIS, ROUTINE W REFLEX MICROSCOPIC
Glucose, UA: NEGATIVE mg/dL
Hgb urine dipstick: NEGATIVE
Ketones, ur: NEGATIVE mg/dL
Nitrite: NEGATIVE
Protein, ur: 100 mg/dL — AB
Specific Gravity, Urine: 1.027 (ref 1.005–1.030)
pH: 5 (ref 5.0–8.0)

## 2022-06-24 LAB — BLOOD GAS, VENOUS
Acid-base deficit: 7.9 mmol/L — ABNORMAL HIGH (ref 0.0–2.0)
Bicarbonate: 17.7 mmol/L — ABNORMAL LOW (ref 20.0–28.0)
O2 Saturation: 38.7 %
Patient temperature: 37
pCO2, Ven: 36 mmHg — ABNORMAL LOW (ref 44–60)
pH, Ven: 7.3 (ref 7.25–7.43)
pO2, Ven: <31 mmHg — CL (ref 32–45)

## 2022-06-24 LAB — LIPASE, BLOOD: Lipase: 34 U/L (ref 11–51)

## 2022-06-24 LAB — LACTIC ACID, PLASMA
Lactic Acid, Venous: 3.5 mmol/L (ref 0.5–1.9)
Lactic Acid, Venous: 1.4 mmol/L (ref 0.5–1.9)

## 2022-06-24 LAB — RESP PANEL BY RT-PCR (RSV, FLU A&B, COVID)  RVPGX2
Influenza A by PCR: NEGATIVE
Influenza B by PCR: NEGATIVE
Resp Syncytial Virus by PCR: NEGATIVE
SARS Coronavirus 2 by RT PCR: NEGATIVE

## 2022-06-24 MED ORDER — LACTATED RINGERS IV BOLUS
1000.0000 mL | Freq: Once | INTRAVENOUS | Status: AC
  Administered 2022-06-24: 1000 mL via INTRAVENOUS

## 2022-06-24 MED ORDER — POTASSIUM CHLORIDE CRYS ER 20 MEQ PO TBCR
20.0000 meq | EXTENDED_RELEASE_TABLET | Freq: Every day | ORAL | Status: DC
  Administered 2022-06-25 – 2022-06-26 (×2): 20 meq via ORAL
  Filled 2022-06-24: qty 2
  Filled 2022-06-24: qty 1

## 2022-06-24 MED ORDER — DIPHENHYDRAMINE HCL 50 MG/ML IJ SOLN
12.5000 mg | Freq: Once | INTRAMUSCULAR | Status: AC
  Administered 2022-06-24: 12.5 mg via INTRAVENOUS
  Filled 2022-06-24: qty 1

## 2022-06-24 MED ORDER — ENOXAPARIN SODIUM 40 MG/0.4ML IJ SOSY
40.0000 mg | PREFILLED_SYRINGE | Freq: Every day | INTRAMUSCULAR | Status: DC
  Administered 2022-06-24 – 2022-06-25 (×2): 40 mg via SUBCUTANEOUS
  Filled 2022-06-24 (×2): qty 0.4

## 2022-06-24 MED ORDER — HYDROMORPHONE HCL 1 MG/ML IJ SOLN
1.0000 mg | INTRAMUSCULAR | Status: DC | PRN
  Administered 2022-06-24 – 2022-06-26 (×9): 1 mg via INTRAVENOUS
  Filled 2022-06-24 (×10): qty 1

## 2022-06-24 MED ORDER — ONDANSETRON HCL 4 MG/2ML IJ SOLN
4.0000 mg | Freq: Once | INTRAMUSCULAR | Status: AC
  Administered 2022-06-24: 4 mg via INTRAVENOUS
  Filled 2022-06-24: qty 2

## 2022-06-24 MED ORDER — POTASSIUM CHLORIDE ER 20 MEQ PO TBCR: 20.0000 meq | EXTENDED_RELEASE_TABLET | Freq: Every day | ORAL | Status: DC

## 2022-06-24 MED ORDER — ATORVASTATIN CALCIUM 40 MG PO TABS
40.0000 mg | ORAL_TABLET | Freq: Every day | ORAL | Status: DC
  Administered 2022-06-24 – 2022-06-26 (×3): 40 mg via ORAL
  Filled 2022-06-24 (×3): qty 1

## 2022-06-24 MED ORDER — PNEUMOCOCCAL 20-VAL CONJ VACC 0.5 ML IM SUSY
0.5000 mL | PREFILLED_SYRINGE | INTRAMUSCULAR | Status: AC
  Administered 2022-06-26: 0.5 mL via INTRAMUSCULAR
  Filled 2022-06-24: qty 0.5

## 2022-06-24 MED ORDER — PANTOPRAZOLE SODIUM 40 MG IV SOLR
40.0000 mg | INTRAVENOUS | Status: DC
  Administered 2022-06-24: 40 mg via INTRAVENOUS
  Filled 2022-06-24: qty 10

## 2022-06-24 MED ORDER — HYDROMORPHONE HCL 1 MG/ML IJ SOLN
0.5000 mg | Freq: Once | INTRAMUSCULAR | Status: AC
  Administered 2022-06-24: 0.5 mg via INTRAVENOUS
  Filled 2022-06-24: qty 1

## 2022-06-24 MED ORDER — METOPROLOL SUCCINATE ER 50 MG PO TB24
25.0000 mg | ORAL_TABLET | Freq: Every day | ORAL | Status: DC
  Administered 2022-06-24 – 2022-06-25 (×2): 25 mg via ORAL
  Filled 2022-06-24 (×2): qty 1

## 2022-06-24 MED ORDER — ONDANSETRON HCL 4 MG PO TABS: 4.0000 mg | ORAL_TABLET | Freq: Four times a day (QID) | ORAL | Status: DC | PRN

## 2022-06-24 MED ORDER — LACTATED RINGERS IV SOLN: INTRAVENOUS | Status: DC

## 2022-06-24 MED ORDER — ACETAMINOPHEN 650 MG RE SUPP: 650.0000 mg | Freq: Four times a day (QID) | RECTAL | Status: DC | PRN

## 2022-06-24 MED ORDER — ONDANSETRON HCL 4 MG/2ML IJ SOLN
4.0000 mg | Freq: Four times a day (QID) | INTRAMUSCULAR | Status: DC | PRN
  Administered 2022-06-25: 4 mg via INTRAVENOUS
  Filled 2022-06-24: qty 2

## 2022-06-24 MED ORDER — ACETAMINOPHEN 325 MG PO TABS
650.0000 mg | ORAL_TABLET | Freq: Four times a day (QID) | ORAL | Status: DC | PRN
  Administered 2022-06-26: 650 mg via ORAL
  Filled 2022-06-24: qty 2

## 2022-06-24 MED ORDER — SERTRALINE HCL 100 MG PO TABS
100.0000 mg | ORAL_TABLET | Freq: Every day | ORAL | Status: DC
  Administered 2022-06-24 – 2022-06-25 (×2): 100 mg via ORAL
  Filled 2022-06-24 (×2): qty 1

## 2022-06-24 MED ORDER — PROCHLORPERAZINE EDISYLATE 10 MG/2ML IJ SOLN
10.0000 mg | Freq: Once | INTRAMUSCULAR | Status: AC
  Administered 2022-06-24: 10 mg via INTRAVENOUS
  Filled 2022-06-24: qty 2
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
## 2022-06-24 NOTE — ED Notes (Signed)
Pt. Attempted to provide urine specimen, pt. Was not able to at this time.
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
## 2022-06-24 NOTE — ED Triage Notes (Signed)
Pt is here for abdominal cramping and n/v/d for 2 days.  Pt reports that diarrhea is watery and she has had cramping in her legs and feels faint

## 2022-06-24 NOTE — ED Notes (Addendum)
Pt attempted to give urine sample, unable to go at this time  

## 2022-06-25 DIAGNOSIS — Z8249 Family history of ischemic heart disease and other diseases of the circulatory system: Secondary | ICD-10-CM | POA: Diagnosis not present

## 2022-06-25 DIAGNOSIS — Z7901 Long term (current) use of anticoagulants: Secondary | ICD-10-CM | POA: Diagnosis not present

## 2022-06-25 DIAGNOSIS — Z1152 Encounter for screening for COVID-19: Secondary | ICD-10-CM | POA: Diagnosis not present

## 2022-06-25 DIAGNOSIS — R109 Unspecified abdominal pain: Secondary | ICD-10-CM | POA: Diagnosis present

## 2022-06-25 DIAGNOSIS — N179 Acute kidney failure, unspecified: Secondary | ICD-10-CM | POA: Diagnosis present

## 2022-06-25 DIAGNOSIS — J4520 Mild intermittent asthma, uncomplicated: Secondary | ICD-10-CM | POA: Diagnosis present

## 2022-06-25 DIAGNOSIS — Z96652 Presence of left artificial knee joint: Secondary | ICD-10-CM | POA: Diagnosis present

## 2022-06-25 DIAGNOSIS — I10 Essential (primary) hypertension: Secondary | ICD-10-CM | POA: Diagnosis present

## 2022-06-25 DIAGNOSIS — Z23 Encounter for immunization: Secondary | ICD-10-CM | POA: Diagnosis not present

## 2022-06-25 DIAGNOSIS — F4310 Post-traumatic stress disorder, unspecified: Secondary | ICD-10-CM | POA: Diagnosis present

## 2022-06-25 DIAGNOSIS — Z808 Family history of malignant neoplasm of other organs or systems: Secondary | ICD-10-CM | POA: Diagnosis not present

## 2022-06-25 DIAGNOSIS — I2510 Atherosclerotic heart disease of native coronary artery without angina pectoris: Secondary | ICD-10-CM | POA: Diagnosis present

## 2022-06-25 DIAGNOSIS — J4489 Other specified chronic obstructive pulmonary disease: Secondary | ICD-10-CM | POA: Diagnosis present

## 2022-06-25 DIAGNOSIS — E872 Acidosis, unspecified: Secondary | ICD-10-CM

## 2022-06-25 DIAGNOSIS — Z888 Allergy status to other drugs, medicaments and biological substances status: Secondary | ICD-10-CM | POA: Diagnosis not present

## 2022-06-25 DIAGNOSIS — Z886 Allergy status to analgesic agent status: Secondary | ICD-10-CM | POA: Diagnosis not present

## 2022-06-25 DIAGNOSIS — K529 Noninfective gastroenteritis and colitis, unspecified: Secondary | ICD-10-CM | POA: Diagnosis present

## 2022-06-25 DIAGNOSIS — Z7951 Long term (current) use of inhaled steroids: Secondary | ICD-10-CM | POA: Diagnosis not present

## 2022-06-25 DIAGNOSIS — F419 Anxiety disorder, unspecified: Secondary | ICD-10-CM | POA: Diagnosis present

## 2022-06-25 DIAGNOSIS — K219 Gastro-esophageal reflux disease without esophagitis: Secondary | ICD-10-CM | POA: Diagnosis present

## 2022-06-25 DIAGNOSIS — E860 Dehydration: Secondary | ICD-10-CM

## 2022-06-25 DIAGNOSIS — F32A Depression, unspecified: Secondary | ICD-10-CM | POA: Diagnosis present

## 2022-06-25 DIAGNOSIS — E785 Hyperlipidemia, unspecified: Secondary | ICD-10-CM | POA: Diagnosis present

## 2022-06-25 DIAGNOSIS — F17210 Nicotine dependence, cigarettes, uncomplicated: Secondary | ICD-10-CM | POA: Diagnosis present

## 2022-06-25 DIAGNOSIS — Z79899 Other long term (current) drug therapy: Secondary | ICD-10-CM | POA: Diagnosis not present

## 2022-06-25 DIAGNOSIS — R112 Nausea with vomiting, unspecified: Secondary | ICD-10-CM | POA: Diagnosis not present

## 2022-06-25 LAB — COMPREHENSIVE METABOLIC PANEL
ALT: 9 U/L (ref 0–44)
AST: 16 U/L (ref 15–41)
Albumin: 3.2 g/dL — ABNORMAL LOW (ref 3.5–5.0)
Alkaline Phosphatase: 72 U/L (ref 38–126)
Anion gap: 7 (ref 5–15)
BUN: 10 mg/dL (ref 8–23)
CO2: 20 mmol/L — ABNORMAL LOW (ref 22–32)
Calcium: 8.7 mg/dL — ABNORMAL LOW (ref 8.9–10.3)
Chloride: 109 mmol/L (ref 98–111)
Creatinine, Ser: 0.97 mg/dL (ref 0.44–1.00)
GFR, Estimated: >60 mL/min (ref >60)
Glucose, Bld: 102 mg/dL — ABNORMAL HIGH (ref 70–99)
Potassium: 4.3 mmol/L (ref 3.5–5.1)
Sodium: 136 mmol/L (ref 135–145)
Total Bilirubin: 0.4 mg/dL (ref 0.3–1.2)
Total Protein: 5.8 g/dL — ABNORMAL LOW (ref 6.5–8.1)

## 2022-06-25 LAB — HIV ANTIBODY (ROUTINE TESTING W REFLEX): HIV Screen 4th Generation wRfx: NONREACTIVE

## 2022-06-25 MED ORDER — PANTOPRAZOLE SODIUM 40 MG PO TBEC
40.0000 mg | DELAYED_RELEASE_TABLET | Freq: Every day | ORAL | Status: DC
  Administered 2022-06-25 – 2022-06-26 (×2): 40 mg via ORAL
  Filled 2022-06-25 (×2): qty 1

## 2022-06-25 MED ORDER — DICYCLOMINE HCL 10 MG PO CAPS
10.0000 mg | ORAL_CAPSULE | Freq: Four times a day (QID) | ORAL | Status: AC | PRN
  Administered 2022-06-25 – 2022-06-26 (×2): 10 mg via ORAL
  Filled 2022-06-25 (×2): qty 1

## 2022-06-25 MED ORDER — HYDROMORPHONE HCL 1 MG/ML IJ SOLN
0.5000 mg | Freq: Once | INTRAMUSCULAR | Status: AC
  Administered 2022-06-25: 0.5 mg via INTRAVENOUS
  Filled 2022-06-25: qty 0.5

## 2022-06-25 NOTE — Plan of Care (Signed)
  Problem: Education: Goal: Knowledge of General Education information will improve Description: Including pain rating scale, medication(s)/side effects and non-pharmacologic comfort measures Outcome: Progressing   Problem: Health Behavior/Discharge Planning: Goal: Ability to manage health-related needs will improve Outcome: Progressing   Problem: Nutrition: Goal: Adequate nutrition will be maintained Outcome: Progressing   

## 2022-06-25 NOTE — Progress Notes (Signed)
[facility] PROGRESS NOTE    Progress Note  [name]  UJW:[id number] DOB: [birth year] DOA: 06/24/2022 PCP: Knox Royalty, MD     Brief Narrative:   Meghan Bowman is an 62 y.o. female past medical history significant for alcohol PTSD sleep apnea comes into the ED for nausea vomiting and diarrhea along with abdominal pain that started 2 days prior to admission, she relates sick contacts one of her grandkids with diarrhea and fever, white count 11.6, hemoglobin of 15.4 SARS-CoV-2 and influenza PCR were negative   Assessment/Plan:   AKI (acute kidney injury) (HCC) Likely prerenal azotemia started on IV fluids his creatinine has returned to baseline.  Normal anion gap metabolic acidosis: Likely due to acute injury resolved with IV fluid resuscitation.  Nausea vomiting and abdominal pain: Now resolved with Zofran. Check a gastrointestinal panel.  Essential hypertension Continue metoprolol, blood pressure stable.  HLD (hyperlipidemia) Continue statins.  Coronary artery disease: Asymptomatic continue beta-blockers and statins.  Mild asthma/COPD: Continue inhalers.  GERD: Continue PPI.   DVT prophylaxis: lovenox Family Communication:none Status is: Observation The patient remains OBS appropriate and will d/c before 2 midnights.    Code Status:     Code Status Orders  (From admission, onward)           Start     Ordered   06/24/22 1439  Full code  Continuous       Question:  By:  Answer:  Consent: discussion documented in EHR   06/24/22 1438           Code Status History     Date Active Date Inactive Code Status Order ID Comments User Context   12/24/2021 1254 12/31/2021 1743 Full Code [id number]  Lynnae Prude Inpatient   12/15/2021 2053 12/24/2021 1246 Full Code [id number]  Angie Fava, DO ED   09/05/2018 0311 09/06/2018 0115 Full Code [id number]  Briscoe Deutscher, MD Inpatient   06/09/2016 2344 06/10/2016 2156 Full Code  [id number]  Therisa Doyne, MD Inpatient   02/11/2015 1107 02/11/2015 1823 Full Code [id number]  Rinaldo Cloud, MD Inpatient   11/13/2013 1934 11/15/2013 1422 Full Code [id number]  Eulas Post, MD Inpatient   10/02/2012 1624 10/04/2012 0207 Full Code [id number]  Dorothea Ogle, MD Inpatient   07/25/2012 0510 07/26/2012 1736 Full Code [id number]  Tarry Kos, MD ED         IV Access:   Peripheral IV   Procedures and diagnostic studies:   CT ABDOMEN PELVIS WO CONTRAST  Result Date: 06/24/2022 CLINICAL DATA:  Abdominal pain with nausea, vomiting, and diarrhea EXAM: CT ABDOMEN AND PELVIS WITHOUT CONTRAST TECHNIQUE: Multidetector CT imaging of the abdomen and pelvis was performed following the standard protocol without IV contrast. RADIATION DOSE REDUCTION: This exam was performed according to the departmental dose-optimization program which includes automated exposure control, adjustment of the mA and/or kV according to patient size and/or use of iterative reconstruction technique. COMPARISON:  05/12/2017 FINDINGS: Lower chest: Moderate hiatal hernia with air-fluid level. Hepatobiliary: Unremarkable Pancreas: Unremarkable Spleen: Stable appearance of multiple splenules in the left upper quadrant. Adrenals/Urinary Tract: 1.1 by 0.8 cm nodule of the right adrenal gland, internal density 6 Hounsfield units, favoring a small right adrenal adenoma. No further imaging workup of this lesion is indicated. The kidneys appear normal.  The urinary bladder is empty. Stomach/Bowel: Postoperative findings in the right colon. Air fluid level in the rectum compatible with diarrheal process. No pneumatosis or substantial degree of bowel wall thickening.  No dilated small bowel to suggest obstruction. The distal colon is mostly empty. Vascular/Lymphatic: Mild aortoiliac atherosclerotic vascular disease. Reproductive: Unremarkable.  Incidental retroverted uterus. Other: Incidental clips noted along the right groin.  Musculoskeletal: Interval but chronic superior endplate compression at L2 with 30% loss of vertebral height. IMPRESSION: 1. Air-fluid level in the rectum compatible with diarrheal process. No pneumatosis or substantial degree of bowel wall thickening. No obstruction observed. 2. Moderate hiatal hernia with air-fluid level. 3. Small right adrenal adenoma. 4. Interval but chronic superior endplate compression at L2 with 30% loss of vertebral height. 5. Mild aortoiliac atherosclerotic vascular disease. Aortic Atherosclerosis (ICD10-I70.0). Electronically Signed   By: Gaylyn Rong M.D.   On: 06/24/2022 13:44     Medical Consultants:   None.   Subjective:    Meghan Bowman feels better diarrhea is improved tolerating her diet, will like to advance to full liquid diet  Objective:    Vitals:   06/24/22 1525 06/24/22 1933 06/25/22 0022 06/25/22 0328  BP: 123/85 115/79 110/82 114/76  Pulse: 92 83 64 60  Resp: 18 18 16 18   Temp: 98.7 F (37.1 C) 97.8 F (36.6 C) 98.3 F (36.8 C) 97.9 F (36.6 C)  TempSrc: Oral Oral Oral Oral  SpO2: 98% 100% 98% 99%  Weight:      Height:       SpO2: 99 %   Intake/Output Summary (Last 24 hours) at 06/25/2022 0707 Last data filed at 06/25/2022 0400 Gross per 24 hour  Intake 5098.67 ml  Output 900 ml  Net 4198.67 ml   Filed Weights   06/24/22 1455  Weight: 95.8 kg    Exam: General exam: In no acute distress. Respiratory system: Good air movement and clear to auscultation. Cardiovascular system: S1 & S2 heard, RRR. No JVD. Gastrointestinal system: Abdomen is nondistended, soft and nontender.  Extremities: No pedal edema. Skin: No rashes, lesions or ulcers Psychiatry: Judgement and insight appear normal. Mood & affect appropriate.    Data Reviewed:    Labs: Basic Metabolic Panel: Recent Labs  Lab 06/24/22 1130 06/25/22 0540  NA 135 136  K 4.0 4.3  CL 108 109  CO2 12* 20*  GLUCOSE 223* 102*  BUN 17 10  CREATININE 1.83* 0.97   CALCIUM 9.9 8.7*   GFR Estimated Creatinine Clearance: 68.8 mL/min (by C-G formula based on SCr of 0.97 mg/dL). Liver Function Tests: Recent Labs  Lab 06/24/22 1130 06/25/22 0540  AST 20 16  ALT 12 9  ALKPHOS 101 72  BILITOT 0.8 0.4  PROT 8.4* 5.8*  ALBUMIN 4.6 3.2*   Recent Labs  Lab 06/24/22 1130  LIPASE 34   No results for input(s): "AMMONIA" in the last 168 hours. Coagulation profile No results for input(s): "INR", "PROTIME" in the last 168 hours. COVID-19 Labs  No results for input(s): "DDIMER", "FERRITIN", "LDH", "CRP" in the last 72 hours.  Lab Results  Component Value Date   SARSCOV2NAA NEGATIVE 06/24/2022   SARSCOV2NAA NEGATIVE 04/06/2019   SARSCOV2NAA NEGATIVE 12/06/2018   SARSCOV2NAA NEGATIVE 09/05/2018    CBC: Recent Labs  Lab 06/24/22 1130  WBC 11.6*  HGB 15.4*  HCT 48.1*  MCV 91.1  PLT 266   Cardiac Enzymes: No results for input(s): "CKTOTAL", "CKMB", "CKMBINDEX", "TROPONINI" in the last 168 hours. BNP (last 3 results) No results for input(s): "PROBNP" in the last 8760 hours. CBG: No results for input(s): "GLUCAP" in the last 168 hours. D-Dimer: No results for input(s): "DDIMER" in the last 72 hours.  Hgb A1c: No results for input(s): "HGBA1C" in the last 72 hours. Lipid Profile: No results for input(s): "CHOL", "HDL", "LDLCALC", "TRIG", "CHOLHDL", "LDLDIRECT" in the last 72 hours. Thyroid function studies: No results for input(s): "TSH", "T4TOTAL", "T3FREE", "THYROIDAB" in the last 72 hours.  Invalid input(s): "FREET3" Anemia work up: No results for input(s): "VITAMINB12", "FOLATE", "FERRITIN", "TIBC", "IRON", "RETICCTPCT" in the last 72 hours. Sepsis Labs: Recent Labs  Lab 06/24/22 1130 06/24/22 1259 06/24/22 1505  WBC 11.6*  --   --   LATICACIDVEN  --  3.5* 1.4   Microbiology Recent Results (from the past 240 hour(s))  Resp panel by RT-PCR (RSV, Flu A&B, Covid) Anterior Nasal Swab     Status: None   Collection Time:  06/24/22 12:48 PM   Specimen: Anterior Nasal Swab  Result Value Ref Range Status   SARS Coronavirus 2 by RT PCR NEGATIVE NEGATIVE Final    Comment: (NOTE) SARS-CoV-2 target nucleic acids are NOT DETECTED.  The SARS-CoV-2 RNA is generally detectable in upper respiratory specimens during the acute phase of infection. The lowest concentration of SARS-CoV-2 viral copies this assay can detect is 138 copies/mL. A negative result does not preclude SARS-Cov-2 infection and should not be used as the sole basis for treatment or other patient management decisions. A negative result may occur with  improper specimen collection/handling, submission of specimen other than nasopharyngeal swab, presence of viral mutation(s) within the areas targeted by this assay, and inadequate number of viral copies(<138 copies/mL). A negative result must be combined with clinical observations, patient history, and epidemiological information. The expected result is Negative.  Fact Sheet for Patients:  BloggerCourse.com  Fact Sheet for Healthcare Providers:  SeriousBroker.it  This test is no t yet approved or cleared by the [place] FDA and  has been authorized for detection and/or diagnosis of SARS-CoV-2 by FDA under an Emergency Use Authorization (EUA). This EUA will remain  in effect (meaning this test can be used) for the duration of the COVID-19 declaration under Section 564(b)(1) of the Act, 21 U.S.C.section 360bbb-3(b)(1), unless the authorization is terminated  or revoked sooner.       Influenza A by PCR NEGATIVE NEGATIVE Final   Influenza B by PCR NEGATIVE NEGATIVE Final    Comment: (NOTE) The Xpert Xpress SARS-CoV-2/FLU/RSV plus assay is intended as an aid in the diagnosis of influenza from Nasopharyngeal swab specimens and should not be used as a sole basis for treatment. Nasal washings and aspirates are unacceptable for Xpert Xpress  SARS-CoV-2/FLU/RSV testing.  Fact Sheet for Patients: BloggerCourse.com  Fact Sheet for Healthcare Providers: SeriousBroker.it  This test is not yet approved or cleared by the [place] FDA and has been authorized for detection and/or diagnosis of SARS-CoV-2 by FDA under an Emergency Use Authorization (EUA). This EUA will remain in effect (meaning this test can be used) for the duration of the COVID-19 declaration under Section 564(b)(1) of the Act, 21 U.S.C. section 360bbb-3(b)(1), unless the authorization is terminated or revoked.     Resp Syncytial Virus by PCR NEGATIVE NEGATIVE Final    Comment: (NOTE) Fact Sheet for Patients: BloggerCourse.com  Fact Sheet for Healthcare Providers: SeriousBroker.it  This test is not yet approved or cleared by the [place] FDA and has been authorized for detection and/or diagnosis of SARS-CoV-2 by FDA under an Emergency Use Authorization (EUA). This EUA will remain in effect (meaning this test can be used) for the duration of the COVID-19 declaration under Section 564(b)(1) of the Act, 21  U.S.C. section 360bbb-3(b)(1), unless the authorization is terminated or revoked.  Performed at [facility], [address], [place], [place] [address]      Medications:    atorvastatin  40 mg Oral Daily   enoxaparin (LOVENOX) injection  40 mg Subcutaneous QHS   metoprolol succinate  25 mg Oral QHS   pantoprazole (PROTONIX) IV  40 mg Intravenous Q24H   pneumococcal 20-valent conjugate vaccine  0.5 mL Intramuscular Tomorrow-1000   potassium chloride  20 mEq Oral Daily   sertraline  100 mg Oral QHS   Continuous Infusions:  lactated ringers 125 mL/hr at 06/25/22 0400      LOS: 0 days   Marinda Elk  [facility]  06/25/2022, 7:07 AM

## 2022-06-25 NOTE — TOC Initial Note (Signed)
Transition of Care [facility]) - Initial/Assessment Note    Patient Details  Name: Meghan Bowman MRN: [id number] Date of Birth: 05-18-1960  Transition of Care [facility]) CM/SW Contact:    Lanier Clam, RN Phone Number: 06/25/2022, 1:23 PM  Clinical Narrative:  patients d/c plan home. Has PCP,pharmacy,home safe,transport.No CM needs.                 Expected Discharge Plan: Home/Self Care Barriers to Discharge: Continued Medical Work up   Patient Goals and CMS Choice Patient states their goals for this hospitalization and ongoing recovery are:: Home CMS Medicare.gov Compare Post Acute Care list provided to:: Patient Choice offered to / list presented to : Patient [place] ownership interest in [facility].provided to:: Adult Children    Expected Discharge Plan and Services   Discharge Planning Services: CM Consult   Living arrangements for the past 2 months: Single Family Home                                      Prior Living Arrangements/Services Living arrangements for the past 2 months: Single Family Home Lives with:: Adult Children Patient language and need for interpreter reviewed:: Yes Do you feel safe going back to the place where you live?: Yes      Need for Family Participation in Patient Care: Yes (Comment) Care giver support system in place?: Yes (comment)   Criminal Activity/Legal Involvement Pertinent to Current Situation/Hospitalization: No - Comment as needed  Activities of Daily Living Home Assistive Devices/Equipment: Other (Comment) (toilet seat extender) ADL Screening (condition at time of admission) Patient's cognitive ability adequate to safely complete daily activities?: Yes Is the patient deaf or have difficulty hearing?: No Does the patient have difficulty seeing, even when wearing glasses/contacts?: No Does the patient have difficulty concentrating, remembering, or making decisions?: No Patient able to express need for assistance  with ADLs?: Yes Does the patient have difficulty dressing or bathing?: Yes Independently performs ADLs?: Yes (appropriate for developmental age) Does the patient have difficulty walking or climbing stairs?: Yes Weakness of Legs: Both Weakness of Arms/Hands: None  Permission Sought/Granted Permission sought to share information with : Case Manager Permission granted to share information with : Yes, Verbal Permission Granted  Share Information with NAME: Case manager           Emotional Assessment Appearance:: Appears stated age Attitude/Demeanor/Rapport: Gracious Affect (typically observed): Accepting Orientation: : Oriented to Self, Oriented to Place, Oriented to  Time, Oriented to Situation Alcohol / Substance Use: Not Applicable Psych Involvement: No (comment)  Admission diagnosis:  Dehydration [E86.0] Compensated metabolic acidosis [E87.20] AKI (acute kidney injury) (HCC) [N17.9] Nausea vomiting and diarrhea [R11.2, R19.7] Gastroenteritis [K52.9] 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 12/24/2021   Hypotension 12/16/2021   Essential hypertension 12/16/2021   Fall at home, initial encounter 12/16/2021   Hypomagnesemia 12/16/2021   Leukocytosis 12/16/2021   GAD (generalized anxiety disorder) 12/16/2021   Mild intermittent asthma 12/16/2021   GERD (gastroesophageal reflux disease) 12/16/2021   HLD (hyperlipidemia) 12/16/2021   Closed fracture of left distal femur (HCC) 12/15/2021   Transient neurological symptoms 09/05/2018   Chronic migraine 11/02/2017   COPD (chronic obstructive pulmonary disease) (HCC) 06/09/2016   CAD (coronary artery disease) 06/09/2016   Osteoarthritis of left  knee 11/13/2013   Knee osteoarthritis 11/13/2013   HTN (hypertension) 10/02/2012   Hypokalemia 10/02/2012   Chronic headaches 07/25/2012    Substance addiction recovering 07/25/2012   Dyspnea 10/06/2011   Tobacco abuse 10/06/2011   KNEE PAIN, LEFT 01/13/2010   PTSD 08/10/2007   PCP:  Knox Royalty, MD Pharmacy:   [id number] [place], [place] - [address] [place] [address] Phone: [phone] Fax: [phone]  [facility] [address] [place] [address] Phone: [phone] Fax: [phone]     Social Determinants of Health (SDOH) Social History: SDOH Screenings   Food Insecurity: No Food Insecurity (06/24/2022)  Housing: Low Risk  (06/24/2022)  Transportation Needs: No Transportation Needs (06/24/2022)  Utilities: Not At Risk (06/24/2022)  Tobacco Use: High Risk (06/24/2022)   SDOH Interventions:     Readmission Risk Interventions     No data to display

## 2022-06-26 DIAGNOSIS — E872 Acidosis, unspecified: Secondary | ICD-10-CM | POA: Diagnosis not present

## 2022-06-26 DIAGNOSIS — E860 Dehydration: Secondary | ICD-10-CM | POA: Diagnosis not present

## 2022-06-26 DIAGNOSIS — N179 Acute kidney failure, unspecified: Secondary | ICD-10-CM | POA: Diagnosis not present

## 2022-06-26 DIAGNOSIS — R197 Diarrhea, unspecified: Secondary | ICD-10-CM

## 2022-06-26 DIAGNOSIS — R112 Nausea with vomiting, unspecified: Secondary | ICD-10-CM

## 2022-06-26 LAB — BASIC METABOLIC PANEL
Anion gap: 4 — ABNORMAL LOW (ref 5–15)
BUN: 5 mg/dL — ABNORMAL LOW (ref 8–23)
CO2: 27 mmol/L (ref 22–32)
Calcium: 8.5 mg/dL — ABNORMAL LOW (ref 8.9–10.3)
Chloride: 102 mmol/L (ref 98–111)
Creatinine, Ser: 0.72 mg/dL (ref 0.44–1.00)
GFR, Estimated: >60 mL/min (ref >60)
Glucose, Bld: 83 mg/dL (ref 70–99)
Potassium: 4.4 mmol/L (ref 3.5–5.1)
Sodium: 133 mmol/L — ABNORMAL LOW (ref 135–145)

## 2022-06-26 MED ORDER — PANTOPRAZOLE SODIUM 40 MG PO TBEC: 40.0000 mg | DELAYED_RELEASE_TABLET | Freq: Every day | ORAL | 0 refills | Status: AC

## 2022-06-26 NOTE — Plan of Care (Signed)

## 2022-06-26 NOTE — Discharge Summary (Signed)
Physician Discharge Summary  Meghan Bowman WUJ:[id number] DOB: 1960-09-27 DOA: 06/24/2022  PCP: Knox Royalty, MD  Admit date: 06/24/2022 Discharge date: 06/26/2022  Admitted From: Home Disposition:  Home  Recommendations for Outpatient Follow-up:  Follow up with PCP in 1-2 weeks Please obtain BMP/CBC in one week  Home Health:No Equipment/Devices:None  Discharge Condition:Stable CODE STATUS:Full Diet recommendation: Heart Healthy   Brief/Interim Summary: 62 y.o. female past medical history significant for alcohol PTSD sleep apnea comes into the ED for nausea vomiting and diarrhea along with abdominal pain that started 2 days prior to admission, she relates sick contacts one of her grandkids with diarrhea and fever, white count 11.6, hemoglobin of 15.4 SARS-CoV-2 and influenza PCR were negative   Discharge Diagnoses:  Principal Problem:   AKI (acute kidney injury) (HCC) Active Problems:   Essential hypertension   HLD (hyperlipidemia)   Mild intermittent asthma   GERD (gastroesophageal reflux disease)   COPD (chronic obstructive pulmonary disease) (HCC)   CAD (coronary artery disease)   Gastroenteritis  Acute kidney injury: Likely prerenal azotemia in the setting of nausea vomiting abdominal pain and diarrhea. She was started on IV fluids her creatinine returned to baseline.  Normal anion gap metabolic acidosis: Likely due to acute kidney injury resolved with IV fluids.  Essential hypertension: She will resume her home regimen.  Nausea vomiting abdominal pain: She was started on Zofran she was able to tolerate her diet. She remained stable.  Hyperlipidemia: Continue statins.  CAD: Asymptomatic continue current meds.  Mild asthma: Continue inhalers.   Discharge Instructions  Discharge Instructions     Diet - low sodium heart healthy   Complete by: As directed    Increase activity slowly   Complete by: As directed       Allergies as of 06/26/2022        Reactions   Ibuprofen Other (See Comments)   Due to stroke   Other Other (See Comments)   Black mold - Causes lungs to collapse  PLEASE BE AWARE THAT PT IS A RECOVERING ALCOHOLIC AND DRUG ADDICT AND WOULD RATHER NOT USE PAIN MEDICATION ON CONTINUOUS BASIS  AFTER LEAVING THE HOSPITAL   Tape Itching, Rash   Please use "paper" tape  Bandages are worse        Medication List     STOP taking these medications    esomeprazole 40 MG capsule Commonly known as: NEXIUM       TAKE these medications    ADVAIR DISKUS IN Inhale 2 puffs into the lungs 2 (two) times daily as needed (wheezing/SOB/allergies).   albuterol 108 (90 Base) MCG/ACT inhaler Commonly known as: VENTOLIN HFA Inhale 2 puffs into the lungs every 6 (six) hours as needed for wheezing or shortness of breath.   atorvastatin 40 MG tablet Commonly known as: LIPITOR Take 40 mg by mouth daily.   budesonide-formoterol 80-4.5 MCG/ACT inhaler Commonly known as: SYMBICORT Inhale 2 puffs into the lungs 2 (two) times daily as needed (wheezing).   diclofenac Sodium 1 % Gel Commonly known as: VOLTAREN Apply 2 g topically 4 (four) times daily. What changed:  how much to take when to take this reasons to take this   EPINEPHrine 0.3 mg/0.3 mL Soaj injection Commonly known as: EPI-PEN Inject into the muscle.   fluocinolone 0.01 % external solution Commonly known as: SYNALAR Apply topically 2 (two) times daily. For use in hair   fluticasone 50 MCG/ACT nasal spray Commonly known as: FLONASE Place 2 sprays into both nostrils daily  as needed for allergies.   gabapentin 300 MG capsule Commonly known as: NEURONTIN Take 600 mg by mouth every 8 (eight) hours as needed.   HYDROcodone-acetaminophen 10-325 MG tablet Commonly known as: NORCO Take 0.5-1 tablets by mouth every 6 (six) hours as needed for pain What changed:  how much to take reasons to take this   levocetirizine 5 MG tablet Commonly known as: XYZAL Take 5 mg  by mouth daily. When able to remember   methocarbamol 500 MG tablet Commonly known as: ROBAXIN Take 1 tablet (500 mg total) by mouth every 6 (six) hours as needed for muscle spasms.   metoprolol succinate 25 MG 24 hr tablet Commonly known as: TOPROL-XL Take 1 tablet (25 mg total) by mouth daily. What changed: when to take this   nicotine 21 mg/24hr patch Commonly known as: NICODERM CQ - dosed in mg/24 hours 21 mg patch daily for two weeks then 14 mg patch daily for three weeks then 7 mg patch daily for 3 weeks and stop   nitroGLYCERIN 0.4 MG SL tablet Commonly known as: NITROSTAT Place 0.4 mg under the tongue every 5 (five) minutes as needed for chest pain.   pantoprazole 40 MG tablet Commonly known as: PROTONIX Take 1 tablet (40 mg total) by mouth daily.   polyethylene glycol 17 g packet Commonly known as: MIRALAX / GLYCOLAX Take 17 g by mouth daily.   Potassium Chloride ER 20 MEQ Tbcr Take 20 mEq by mouth daily.   predniSONE 10 MG (21) Tbpk tablet Commonly known as: STERAPRED UNI-PAK 21 TAB Take by mouth daily. Take 6 tabs by mouth daily  for 2 days, then 5 tabs for 2 days, then 4 tabs for 2 days, then 3 tabs for 2 days, 2 tabs for 2 days, then 1 tab by mouth daily for 2 days   sertraline 100 MG tablet Commonly known as: ZOLOFT Take 1 tablet (100 mg total) by mouth at bedtime.   tobramycin 0.3 % ophthalmic solution Commonly known as: TOBREX Place 1 drop into the left eye See admin instructions. Instill 1 drop to left eye four times a day the day before injection and the day after four times a day   triamcinolone cream 0.5 % Commonly known as: KENALOG Apply 1 Application topically every other day.   Vitamin D (Ergocalciferol) 1.25 MG (50000 UNIT) Caps capsule Commonly known as: DRISDOL Take 1 capsule (50,000 Units total) by mouth every 7 (seven) days.        Allergies  Allergen Reactions   Ibuprofen Other (See Comments)    Due to stroke   Other Other (See  Comments)    Black mold - Causes lungs to collapse   PLEASE BE AWARE THAT PT IS A RECOVERING ALCOHOLIC AND DRUG ADDICT AND WOULD RATHER NOT USE PAIN MEDICATION ON CONTINUOUS BASIS  AFTER LEAVING THE HOSPITAL   Tape Itching and Rash    Please use "paper" tape  Bandages are worse    Consultations: None   Procedures/Studies: CT ABDOMEN PELVIS WO CONTRAST  Result Date: 06/24/2022 CLINICAL DATA:  Abdominal pain with nausea, vomiting, and diarrhea EXAM: CT ABDOMEN AND PELVIS WITHOUT CONTRAST TECHNIQUE: Multidetector CT imaging of the abdomen and pelvis was performed following the standard protocol without IV contrast. RADIATION DOSE REDUCTION: This exam was performed according to the departmental dose-optimization program which includes automated exposure control, adjustment of the mA and/or kV according to patient size and/or use of iterative reconstruction technique. COMPARISON:  05/12/2017 FINDINGS: Lower chest:  Moderate hiatal hernia with air-fluid level. Hepatobiliary: Unremarkable Pancreas: Unremarkable Spleen: Stable appearance of multiple splenules in the left upper quadrant. Adrenals/Urinary Tract: 1.1 by 0.8 cm nodule of the right adrenal gland, internal density 6 Hounsfield units, favoring a small right adrenal adenoma. No further imaging workup of this lesion is indicated. The kidneys appear normal.  The urinary bladder is empty. Stomach/Bowel: Postoperative findings in the right colon. Air fluid level in the rectum compatible with diarrheal process. No pneumatosis or substantial degree of bowel wall thickening. No dilated small bowel to suggest obstruction. The distal colon is mostly empty. Vascular/Lymphatic: Mild aortoiliac atherosclerotic vascular disease. Reproductive: Unremarkable.  Incidental retroverted uterus. Other: Incidental clips noted along the right groin. Musculoskeletal: Interval but chronic superior endplate compression at L2 with 30% loss of vertebral height. IMPRESSION: 1.  Air-fluid level in the rectum compatible with diarrheal process. No pneumatosis or substantial degree of bowel wall thickening. No obstruction observed. 2. Moderate hiatal hernia with air-fluid level. 3. Small right adrenal adenoma. 4. Interval but chronic superior endplate compression at L2 with 30% loss of vertebral height. 5. Mild aortoiliac atherosclerotic vascular disease. Aortic Atherosclerosis (ICD10-I70.0). Electronically Signed   By: Gaylyn Rong M.D.   On: 06/24/2022 13:44   (Echo, Carotid, EGD, Colonoscopy, ERCP)    Subjective: No complaints tolerating her diet.  Discharge Exam: Vitals:   06/25/22 2034 06/26/22 0423  BP: 108/73 108/66  Pulse: 70 74  Resp: 18 19  Temp: 97.9 F (36.6 C) 97.8 F (36.6 C)  SpO2: 100% 97%   Vitals:   06/25/22 0328 06/25/22 1232 06/25/22 2034 06/26/22 0423  BP: 114/76 115/73 108/73 108/66  Pulse: 60 63 70 74  Resp: 18 18 18 19   Temp: 97.9 F (36.6 C) 97.7 F (36.5 C) 97.9 F (36.6 C) 97.8 F (36.6 C)  TempSrc: Oral Oral Oral Oral  SpO2: 99% 98% 100% 97%  Weight:      Height:        General: Pt is alert, awake, not in acute distress Cardiovascular: RRR, S1/S2 +, no rubs, no gallops Respiratory: CTA bilaterally, no wheezing, no rhonchi Abdominal: Soft, NT, ND, bowel sounds + Extremities: no edema, no cyanosis    The results of significant diagnostics from this hospitalization (including imaging, microbiology, ancillary and laboratory) are listed below for reference.     Microbiology: Recent Results (from the past 240 hour(s))  Resp panel by RT-PCR (RSV, Flu A&B, Covid) Anterior Nasal Swab     Status: None   Collection Time: 06/24/22 12:48 PM   Specimen: Anterior Nasal Swab  Result Value Ref Range Status   SARS Coronavirus 2 by RT PCR NEGATIVE NEGATIVE Final    Comment: (NOTE) SARS-CoV-2 target nucleic acids are NOT DETECTED.  The SARS-CoV-2 RNA is generally detectable in upper respiratory specimens during the acute  phase of infection. The lowest concentration of SARS-CoV-2 viral copies this assay can detect is 138 copies/mL. A negative result does not preclude SARS-Cov-2 infection and should not be used as the sole basis for treatment or other patient management decisions. A negative result may occur with  improper specimen collection/handling, submission of specimen other than nasopharyngeal swab, presence of viral mutation(s) within the areas targeted by this assay, and inadequate number of viral copies(<138 copies/mL). A negative result must be combined with clinical observations, patient history, and epidemiological information. The expected result is Negative.  Fact Sheet for Patients:  BloggerCourse.com  Fact Sheet for Healthcare Providers:  SeriousBroker.it  This test is no t  yet approved or cleared by the [place] and  has been authorized for detection and/or diagnosis of SARS-CoV-2 by FDA under an Emergency Use Authorization (EUA). This EUA will remain  in effect (meaning this test can be used) for the duration of the COVID-19 declaration under Section 564(b)(1) of the Act, 21 U.S.C.section 360bbb-3(b)(1), unless the authorization is terminated  or revoked sooner.       Influenza A by PCR NEGATIVE NEGATIVE Final   Influenza B by PCR NEGATIVE NEGATIVE Final    Comment: (NOTE) The Xpert Xpress SARS-CoV-2/FLU/RSV plus assay is intended as an aid in the diagnosis of influenza from Nasopharyngeal swab specimens and should not be used as a sole basis for treatment. Nasal washings and aspirates are unacceptable for Xpert Xpress SARS-CoV-2/FLU/RSV testing.  Fact Sheet for Patients: BloggerCourse.com  Fact Sheet for Healthcare Providers: SeriousBroker.it  This test is not yet approved or cleared by the [place] FDA and has been authorized for detection and/or diagnosis of  SARS-CoV-2 by FDA under an Emergency Use Authorization (EUA). This EUA will remain in effect (meaning this test can be used) for the duration of the COVID-19 declaration under Section 564(b)(1) of the Act, 21 U.S.C. section 360bbb-3(b)(1), unless the authorization is terminated or revoked.     Resp Syncytial Virus by PCR NEGATIVE NEGATIVE Final    Comment: (NOTE) Fact Sheet for Patients: BloggerCourse.com  Fact Sheet for Healthcare Providers: SeriousBroker.it  This test is not yet approved or cleared by the [place] FDA and has been authorized for detection and/or diagnosis of SARS-CoV-2 by FDA under an Emergency Use Authorization (EUA). This EUA will remain in effect (meaning this test can be used) for the duration of the COVID-19 declaration under Section 564(b)(1) of the Act, 21 U.S.C. section 360bbb-3(b)(1), unless the authorization is terminated or revoked.  Performed at [facility], [address] [place], [place] [address]      Labs: BNP (last 3 results) No results for input(s): "BNP" in the last 8760 hours. Basic Metabolic Panel: Recent Labs  Lab 06/24/22 1130 06/25/22 0540 06/26/22 0553  NA 135 136 133*  K 4.0 4.3 4.4  CL 108 109 102  CO2 12* 20* 27  GLUCOSE 223* 102* 83  BUN 17 10 5*  CREATININE 1.83* 0.97 0.72  CALCIUM 9.9 8.7* 8.5*   Liver Function Tests: Recent Labs  Lab 06/24/22 1130 06/25/22 0540  AST 20 16  ALT 12 9  ALKPHOS 101 72  BILITOT 0.8 0.4  PROT 8.4* 5.8*  ALBUMIN 4.6 3.2*   Recent Labs  Lab 06/24/22 1130  LIPASE 34   No results for input(s): "AMMONIA" in the last 168 hours. CBC: Recent Labs  Lab 06/24/22 1130  WBC 11.6*  HGB 15.4*  HCT 48.1*  MCV 91.1  PLT 266   Cardiac Enzymes: No results for input(s): "CKTOTAL", "CKMB", "CKMBINDEX", "TROPONINI" in the last 168 hours. BNP: Invalid input(s): "POCBNP" CBG: No results for input(s): "GLUCAP"  in the last 168 hours. D-Dimer No results for input(s): "DDIMER" in the last 72 hours. Hgb A1c No results for input(s): "HGBA1C" in the last 72 hours. Lipid Profile No results for input(s): "CHOL", "HDL", "LDLCALC", "TRIG", "CHOLHDL", "LDLDIRECT" in the last 72 hours. Thyroid function studies No results for input(s): "TSH", "T4TOTAL", "T3FREE", "THYROIDAB" in the last 72 hours.  Invalid input(s): "FREET3" Anemia work up No results for input(s): "VITAMINB12", "FOLATE", "FERRITIN", "TIBC", "IRON", "RETICCTPCT" in the last 72 hours. Urinalysis    Component Value  Date/Time   COLORURINE AMBER (A) 06/24/2022 1128   APPEARANCEUR CLOUDY (A) 06/24/2022 1128   LABSPEC 1.027 06/24/2022 1128   PHURINE 5.0 06/24/2022 1128   GLUCOSEU NEGATIVE 06/24/2022 1128   HGBUR NEGATIVE 06/24/2022 1128   HGBUR negative 09/01/2007 1133   BILIRUBINUR SMALL (A) 06/24/2022 1128   KETONESUR NEGATIVE 06/24/2022 1128   PROTEINUR 100 (A) 06/24/2022 1128   UROBILINOGEN 0.2 02/18/2014 1744   NITRITE NEGATIVE 06/24/2022 1128   LEUKOCYTESUR TRACE (A) 06/24/2022 1128   Sepsis Labs Recent Labs  Lab 06/24/22 1130  WBC 11.6*   Microbiology Recent Results (from the past 240 hour(s))  Resp panel by RT-PCR (RSV, Flu A&B, Covid) Anterior Nasal Swab     Status: None   Collection Time: 06/24/22 12:48 PM   Specimen: Anterior Nasal Swab  Result Value Ref Range Status   SARS Coronavirus 2 by RT PCR NEGATIVE NEGATIVE Final    Comment: (NOTE) SARS-CoV-2 target nucleic acids are NOT DETECTED.  The SARS-CoV-2 RNA is generally detectable in upper respiratory specimens during the acute phase of infection. The lowest concentration of SARS-CoV-2 viral copies this assay can detect is 138 copies/mL. A negative result does not preclude SARS-Cov-2 infection and should not be used as the sole basis for treatment or other patient management decisions. A negative result may occur with  improper specimen collection/handling,  submission of specimen other than nasopharyngeal swab, presence of viral mutation(s) within the areas targeted by this assay, and inadequate number of viral copies(<138 copies/mL). A negative result must be combined with clinical observations, patient history, and epidemiological information. The expected result is Negative.  Fact Sheet for Patients:  BloggerCourse.com  Fact Sheet for Healthcare Providers:  SeriousBroker.it  This test is no t yet approved or cleared by the [place] FDA and  has been authorized for detection and/or diagnosis of SARS-CoV-2 by FDA under an Emergency Use Authorization (EUA). This EUA will remain  in effect (meaning this test can be used) for the duration of the COVID-19 declaration under Section 564(b)(1) of the Act, 21 U.S.C.section 360bbb-3(b)(1), unless the authorization is terminated  or revoked sooner.       Influenza A by PCR NEGATIVE NEGATIVE Final   Influenza B by PCR NEGATIVE NEGATIVE Final    Comment: (NOTE) The Xpert Xpress SARS-CoV-2/FLU/RSV plus assay is intended as an aid in the diagnosis of influenza from Nasopharyngeal swab specimens and should not be used as a sole basis for treatment. Nasal washings and aspirates are unacceptable for Xpert Xpress SARS-CoV-2/FLU/RSV testing.  Fact Sheet for Patients: BloggerCourse.com  Fact Sheet for Healthcare Providers: SeriousBroker.it  This test is not yet approved or cleared by the [place] FDA and has been authorized for detection and/or diagnosis of SARS-CoV-2 by FDA under an Emergency Use Authorization (EUA). This EUA will remain in effect (meaning this test can be used) for the duration of the COVID-19 declaration under Section 564(b)(1) of the Act, 21 U.S.C. section 360bbb-3(b)(1), unless the authorization is terminated or revoked.     Resp Syncytial Virus by PCR NEGATIVE  NEGATIVE Final    Comment: (NOTE) Fact Sheet for Patients: BloggerCourse.com  Fact Sheet for Healthcare Providers: SeriousBroker.it  This test is not yet approved or cleared by the [place] FDA and has been authorized for detection and/or diagnosis of SARS-CoV-2 by FDA under an Emergency Use Authorization (EUA). This EUA will remain in effect (meaning this test can be used) for the duration of the COVID-19 declaration under Section 564(b)(1) of the  Act, 21 U.S.C. section 360bbb-3(b)(1), unless the authorization is terminated or revoked.  Performed at [facility], [address] [place], [place] [address]      SIGNED:   Marinda Elk, MD  [facility] 06/26/2022, 8:36 AM Pager   If 7PM-7AM, please contact night-coverage [email or web address] Password [email or web address]

## 2022-06-26 NOTE — Plan of Care (Signed)
  Problem: Education: Goal: Knowledge of General Education information will improve Description: Including pain rating scale, medication(s)/side effects and non-pharmacologic comfort measures 06/26/2022 0946 by Angelique Holm, RN Outcome: Adequate for Discharge 06/26/2022 0946 by Angelique Holm, RN Outcome: Adequate for Discharge   Problem: Health Behavior/Discharge Planning: Goal: Ability to manage health-related needs will improve 06/26/2022 0946 by Angelique Holm, RN Outcome: Adequate for Discharge 06/26/2022 0946 by Angelique Holm, RN Outcome: Adequate for Discharge   Problem: Clinical Measurements: Goal: Ability to maintain clinical measurements within normal limits will improve 06/26/2022 0946 by Angelique Holm, RN Outcome: Adequate for Discharge 06/26/2022 0946 by Angelique Holm, RN Outcome: Adequate for Discharge Goal: Will remain free from infection 06/26/2022 0946 by Angelique Holm, RN Outcome: Adequate for Discharge 06/26/2022 0946 by Angelique Holm, RN Outcome: Adequate for Discharge Goal: Diagnostic test results will improve 06/26/2022 0946 by Angelique Holm, RN Outcome: Adequate for Discharge 06/26/2022 0946 by Angelique Holm, RN Outcome: Adequate for Discharge Goal: Respiratory complications will improve 06/26/2022 0946 by Angelique Holm, RN Outcome: Adequate for Discharge 06/26/2022 0946 by Angelique Holm, RN Outcome: Adequate for Discharge Goal: Cardiovascular complication will be avoided 06/26/2022 0946 by Angelique Holm, RN Outcome: Adequate for Discharge 06/26/2022 0946 by Angelique Holm, RN Outcome: Adequate for Discharge   Problem: Activity: Goal: Risk for activity intolerance will decrease 06/26/2022 0946 by Angelique Holm, RN Outcome: Adequate for Discharge 06/26/2022 0946 by Angelique Holm, RN Outcome: Adequate for Discharge   Problem: Nutrition: Goal: Adequate nutrition will be maintained 06/26/2022 0946 by Angelique Holm, RN Outcome: Adequate for Discharge 06/26/2022 0946  by Angelique Holm, RN Outcome: Adequate for Discharge   Problem: Coping: Goal: Level of anxiety will decrease 06/26/2022 0946 by Angelique Holm, RN Outcome: Adequate for Discharge 06/26/2022 0946 by Angelique Holm, RN Outcome: Adequate for Discharge   Problem: Elimination: Goal: Will not experience complications related to bowel motility 06/26/2022 0946 by Angelique Holm, RN Outcome: Adequate for Discharge 06/26/2022 0946 by Angelique Holm, RN Outcome: Adequate for Discharge Goal: Will not experience complications related to urinary retention 06/26/2022 0946 by Angelique Holm, RN Outcome: Adequate for Discharge 06/26/2022 0946 by Angelique Holm, RN Outcome: Adequate for Discharge   Problem: Pain Managment: Goal: General experience of comfort will improve 06/26/2022 0946 by Angelique Holm, RN Outcome: Adequate for Discharge 06/26/2022 0946 by Angelique Holm, RN Outcome: Adequate for Discharge   Problem: Safety: Goal: Ability to remain free from injury will improve 06/26/2022 0946 by Angelique Holm, RN Outcome: Adequate for Discharge 06/26/2022 0946 by Angelique Holm, RN Outcome: Adequate for Discharge   Problem: Skin Integrity: Goal: Risk for impaired skin integrity will decrease 06/26/2022 0946 by Angelique Holm, RN Outcome: Adequate for Discharge 06/26/2022 0946 by Angelique Holm, RN Outcome: Adequate for Discharge

## 2022-06-29 ENCOUNTER — Ambulatory Visit: Payer: Medicaid Other | Admitting: Physical Therapy

## 2022-06-29 LAB — NOROVIRUS GROUP 1 & 2 BY PCR, STOOL
Norovirus 1 by PCR: NEGATIVE
Norovirus 2  by PCR: POSITIVE — AB

## 2022-07-16 ENCOUNTER — Other Ambulatory Visit (HOSPITAL_COMMUNITY): Payer: Self-pay | Admitting: Orthopedic Surgery

## 2022-07-16 DIAGNOSIS — S72409A Unspecified fracture of lower end of unspecified femur, initial encounter for closed fracture: Secondary | ICD-10-CM

## 2022-07-16 DIAGNOSIS — T84031A Mechanical loosening of internal left hip prosthetic joint, initial encounter: Secondary | ICD-10-CM

## 2022-07-23 ENCOUNTER — Ambulatory Visit (HOSPITAL_COMMUNITY)
Admission: RE | Admit: 2022-07-23 | Discharge: 2022-07-23 | Disposition: A | Discharge status: Home / Self Care | Payer: Medicaid Other | Source: Ambulatory Visit | Attending: Orthopedic Surgery | Admitting: Orthopedic Surgery

## 2022-07-23 DIAGNOSIS — S72409A Unspecified fracture of lower end of unspecified femur, initial encounter for closed fracture: Secondary | ICD-10-CM | POA: Insufficient documentation

## 2022-07-23 DIAGNOSIS — T84031A Mechanical loosening of internal left hip prosthetic joint, initial encounter: Secondary | ICD-10-CM | POA: Insufficient documentation

## 2022-08-02 ENCOUNTER — Encounter: Payer: Self-pay | Admitting: Physical Therapy

## 2022-08-02 ENCOUNTER — Other Ambulatory Visit: Payer: Self-pay

## 2022-08-02 ENCOUNTER — Ambulatory Visit: Payer: Medicaid Other | Attending: Orthopedic Surgery | Admitting: Physical Therapy

## 2022-08-02 DIAGNOSIS — M6281 Muscle weakness (generalized): Secondary | ICD-10-CM | POA: Insufficient documentation

## 2022-08-02 DIAGNOSIS — M25562 Pain in left knee: Secondary | ICD-10-CM | POA: Insufficient documentation

## 2022-08-02 DIAGNOSIS — R2689 Other abnormalities of gait and mobility: Secondary | ICD-10-CM | POA: Insufficient documentation

## 2022-08-02 DIAGNOSIS — M25552 Pain in left hip: Secondary | ICD-10-CM | POA: Diagnosis present

## 2022-08-02 DIAGNOSIS — G8929 Other chronic pain: Secondary | ICD-10-CM | POA: Diagnosis present

## 2022-08-02 NOTE — Patient Instructions (Signed)
Access Code: [facility] URL: [email or web address] Date: 08/02/2022 Prepared by: Rosana Hoes  Exercises - Clam with Resistance  - 1 x daily - 3 sets - 10 reps - Active Straight Leg Raise with Quad Set  - 1 x daily - 3 sets - 10 reps
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
## 2022-08-04 ENCOUNTER — Emergency Department (HOSPITAL_COMMUNITY): Payer: Medicaid Other

## 2022-08-04 ENCOUNTER — Encounter (HOSPITAL_COMMUNITY): Payer: Self-pay | Admitting: Emergency Medicine

## 2022-08-04 ENCOUNTER — Emergency Department (HOSPITAL_COMMUNITY)
Admission: EM | Admit: 2022-08-04 | Discharge: 2022-08-04 | Disposition: A | Discharge status: Home / Self Care | Payer: Medicaid Other | Attending: Emergency Medicine | Admitting: Emergency Medicine

## 2022-08-04 DIAGNOSIS — Z79899 Other long term (current) drug therapy: Secondary | ICD-10-CM | POA: Insufficient documentation

## 2022-08-04 DIAGNOSIS — F17210 Nicotine dependence, cigarettes, uncomplicated: Secondary | ICD-10-CM | POA: Insufficient documentation

## 2022-08-04 DIAGNOSIS — I2510 Atherosclerotic heart disease of native coronary artery without angina pectoris: Secondary | ICD-10-CM | POA: Diagnosis not present

## 2022-08-04 DIAGNOSIS — E876 Hypokalemia: Secondary | ICD-10-CM | POA: Diagnosis not present

## 2022-08-04 DIAGNOSIS — I10 Essential (primary) hypertension: Secondary | ICD-10-CM | POA: Insufficient documentation

## 2022-08-04 DIAGNOSIS — J449 Chronic obstructive pulmonary disease, unspecified: Secondary | ICD-10-CM | POA: Diagnosis not present

## 2022-08-04 DIAGNOSIS — R109 Unspecified abdominal pain: Secondary | ICD-10-CM | POA: Diagnosis not present

## 2022-08-04 LAB — URINALYSIS, ROUTINE W REFLEX MICROSCOPIC
Bilirubin Urine: NEGATIVE
Glucose, UA: NEGATIVE mg/dL
Hgb urine dipstick: NEGATIVE
Ketones, ur: NEGATIVE mg/dL
Leukocytes,Ua: NEGATIVE
Nitrite: NEGATIVE
Protein, ur: NEGATIVE mg/dL
Specific Gravity, Urine: 1.006 (ref 1.005–1.030)
pH: 5 (ref 5.0–8.0)

## 2022-08-04 LAB — CBC WITH DIFFERENTIAL/PLATELET
Abs Immature Granulocytes: 0.01 10*3/uL (ref 0.00–0.07)
Basophils Absolute: 0 10*3/uL (ref 0.0–0.1)
Basophils Relative: 0 %
Eosinophils Absolute: 0.2 10*3/uL (ref 0.0–0.5)
Eosinophils Relative: 2 %
HCT: 39.8 % (ref 36.0–46.0)
Hemoglobin: 12.8 g/dL (ref 12.0–15.0)
Immature Granulocytes: 0 %
Lymphocytes Relative: 44 %
Lymphs Abs: 3.5 10*3/uL (ref 0.7–4.0)
MCH: 29.4 pg (ref 26.0–34.0)
MCHC: 32.2 g/dL (ref 30.0–36.0)
MCV: 91.5 fL (ref 80.0–100.0)
Monocytes Absolute: 0.5 10*3/uL (ref 0.1–1.0)
Monocytes Relative: 6 %
Neutro Abs: 3.7 10*3/uL (ref 1.7–7.7)
Neutrophils Relative %: 48 %
Platelets: 206 10*3/uL (ref 150–400)
RBC: 4.35 MIL/uL (ref 3.87–5.11)
RDW: 12.8 % (ref 11.5–15.5)
WBC: 8 10*3/uL (ref 4.0–10.5)
nRBC: 0 % (ref 0.0–0.2)

## 2022-08-04 LAB — BASIC METABOLIC PANEL
Anion gap: 10 (ref 5–15)
BUN: 7 mg/dL — ABNORMAL LOW (ref 8–23)
CO2: 19 mmol/L — ABNORMAL LOW (ref 22–32)
Calcium: 9 mg/dL (ref 8.9–10.3)
Chloride: 107 mmol/L (ref 98–111)
Creatinine, Ser: 0.91 mg/dL (ref 0.44–1.00)
GFR, Estimated: >60 mL/min (ref >60)
Glucose, Bld: 129 mg/dL — ABNORMAL HIGH (ref 70–99)
Potassium: 3.1 mmol/L — ABNORMAL LOW (ref 3.5–5.1)
Sodium: 136 mmol/L (ref 135–145)

## 2022-08-04 MED ORDER — POTASSIUM CHLORIDE 20 MEQ PO PACK
60.0000 meq | PACK | Freq: Once | ORAL | Status: AC
  Administered 2022-08-04: 60 meq via ORAL
  Filled 2022-08-04: qty 3

## 2022-08-04 MED ORDER — ACETAMINOPHEN 500 MG PO TABS
1000.0000 mg | ORAL_TABLET | Freq: Once | ORAL | Status: AC
  Administered 2022-08-04: 1000 mg via ORAL
  Filled 2022-08-04: qty 2

## 2022-08-04 NOTE — ED Triage Notes (Signed)
Pt reports bilateral flank pain starting today. Recently admitted for renal failure and waiting for follow up with kidney specialist. Has been being followed by PCP and getting urine and blood checked and reports stabilizing. She reports she is really just scared about waiting.
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
## 2022-08-04 NOTE — Discharge Instructions (Addendum)
We did you for your flank pain.  Your laboratory tests were reassuring including your kidney function and your CT scan did not show any dangerous problems.  Please follow-up with your primary doctor.  If you develop any new or worsening symptoms such as painful urination, fevers or chills, chest pain, abdominal pain, vomiting or diarrhea, please return for reassessment.

## 2022-08-11 ENCOUNTER — Ambulatory Visit: Payer: Medicaid Other | Admitting: Physical Therapy

## 2022-08-13 ENCOUNTER — Ambulatory Visit: Payer: Medicaid Other | Admitting: Physical Therapy

## 2022-08-16 ENCOUNTER — Encounter: Payer: Medicaid Other | Admitting: Physical Therapy

## 2022-08-18 ENCOUNTER — Encounter: Payer: Medicaid Other | Admitting: Physical Therapy

## 2022-08-23 ENCOUNTER — Ambulatory Visit: Payer: Medicaid Other | Admitting: Physical Therapy

## 2022-10-06 NOTE — Progress Notes (Signed)
Sent message, via epic in basket, requesting orders in epic from surgeon.  

## 2022-10-07 ENCOUNTER — Ambulatory Visit: Payer: Self-pay | Admitting: Emergency Medicine

## 2022-10-07 DIAGNOSIS — M79605 Pain in left leg: Secondary | ICD-10-CM

## 2022-10-07 DIAGNOSIS — T8484XS Pain due to internal orthopedic prosthetic devices, implants and grafts, sequela: Secondary | ICD-10-CM

## 2022-10-07 NOTE — Patient Instructions (Addendum)
SURGICAL WAITING ROOM VISITATION  Patients having surgery or a procedure may have no more than 2 support people in the waiting area - these visitors may rotate.    Children under the age of 65 must have an adult with them who is not the patient.  Due to an increase in RSV and influenza rates and associated hospitalizations, children ages 21 and under may not visit patients in [facility] hospitals.  If the patient needs to stay at the hospital during part of their recovery, the visitor guidelines for inpatient rooms apply. Pre-op nurse will coordinate an appropriate time for 1 support person to accompany patient in pre-op.  This support person may not rotate.    Please refer to the [facility] website for the visitor guidelines for Inpatients (after your surgery is over and you are in a regular room).       Your procedure is scheduled on: 10/20/22   Report to [facility] Main Entrance    Report to admitting at  8 AM   Call this number if you have problems the morning of surgery [phone]   Do not eat food or drink liquids :After Midnight.  Oral Hygiene is also important to reduce your risk of infection.                                    Remember - BRUSH YOUR TEETH THE MORNING OF SURGERY WITH YOUR REGULAR TOOTHPASTE  Don't smoke after midnight   Stop all vitamins and herbal supplements 7 days before surgery.   Take these medicines the morning of surgery with A SIP OF WATER: Atorvastatin, Xyzal, Pantoprazole, Sertraline, Buspar             You may not have any metal on your body including hair pins, jewelry, and body piercing             Do not wear make-up, lotions, powders, perfumes/cologne, or deodorant  Do not wear nail polish including gel and S&S, artificial/acrylic nails, or any other type of covering on natural nails including finger and toenails. If you have artificial nails, gel coating, etc. that needs to be removed by a nail salon please have this removed  prior to surgery or surgery may need to be canceled/ delayed if the surgeon/ anesthesia feels like they are unable to be safely monitored.   Do not shave  48 hours prior to surgery.    Do not bring valuables to the hospital. [facility] IS NOT             RESPONSIBLE   FOR VALUABLES.   Contacts, glasses, dentures or bridgework may not be worn into surgery.  DO NOT BRING YOUR HOME MEDICATIONS TO THE HOSPITAL. PHARMACY WILL DISPENSE MEDICATIONS LISTED ON YOUR MEDICATION LIST TO YOU DURING YOUR ADMISSION IN THE HOSPITAL!    Patients discharged on the day of surgery will not be allowed to drive home.  Someone NEEDS to stay with you for the first 24 hours after anesthesia.   Special Instructions: Bring a copy of your healthcare power of attorney and living will documents the day of surgery if you haven't scanned them before.              Please read over the following fact sheets you were given: IF YOU HAVE QUESTIONS ABOUT YOUR PRE-OP INSTRUCTIONS PLEASE CALL [phone]  Rosey Bath   If you received a  COVID test during your pre-op visit  it is requested that you wear a mask when out in public, stay away from anyone that may not be feeling well and notify your surgeon if you develop symptoms. If you test positive for Covid or have been in contact with anyone that has tested positive in the last 10 days please notify you surgeon.    [facility] - Preparing for Surgery Before surgery, you can play an important role.  Because skin is not sterile, your skin needs to be as free of germs as possible.  You can reduce the number of germs on your skin by washing with CHG (chlorahexidine gluconate) soap before surgery.  CHG is an antiseptic cleaner which kills germs and bonds with the skin to continue killing germs even after washing. Please DO NOT use if you have an allergy to CHG or antibacterial soaps.  If your skin becomes reddened/irritated stop using the CHG and inform your nurse when you arrive at Short  Stay. Do not shave (including legs and underarms) for at least 48 hours prior to the first CHG shower.  You may shave your face/neck.  Please follow these instructions carefully:  1.  Shower with CHG Soap the night before surgery and the  morning of surgery.  2.  If you choose to wash your hair, wash your hair first as usual with your normal  shampoo.  3.  After you shampoo, rinse your hair and body thoroughly to remove the shampoo.                             4.  Use CHG as you would any other liquid soap.  You can apply chg directly to the skin and wash.  Gently with a scrungie or clean washcloth.  5.  Apply the CHG Soap to your body ONLY FROM THE NECK DOWN.   Do   not use on face/ open                           Wound or open sores. Avoid contact with eyes, ears mouth and   genitals (private parts).                       Wash face,  Genitals (private parts) with your normal soap.             6.  Wash thoroughly, paying special attention to the area where your    surgery  will be performed.  7.  Thoroughly rinse your body with warm water from the neck down.  8.  DO NOT shower/wash with your normal soap after using and rinsing off the CHG Soap.                9.  Pat yourself dry with a clean towel.            10.  Wear clean pajamas.            11.  Place clean sheets on your bed the night of your first shower and do not  sleep with pets. Day of Surgery : Do not apply any lotions/deodorants the morning of surgery.  Please wear clean clothes to the hospital/surgery center.  FAILURE TO FOLLOW THESE INSTRUCTIONS MAY RESULT IN THE CANCELLATION OF YOUR SURGERY  PATIENT SIGNATURE_________________________________  NURSE SIGNATURE__________________________________   Incentive Spirometer (Watch this video at  home: [email or web address])  An incentive spirometer is a tool that can help keep your lungs clear and active. This tool measures how well you are filling your lungs  with each breath. Taking long deep breaths may help reverse or decrease the chance of developing breathing (pulmonary) problems (especially infection) following: A long period of time when you are unable to move or be active. BEFORE THE PROCEDURE  If the spirometer includes an indicator to show your best effort, your nurse or respiratory therapist will set it to a desired goal. If possible, sit up straight or lean slightly forward. Try not to slouch. Hold the incentive spirometer in an upright position. INSTRUCTIONS FOR USE  Sit on the edge of your bed if possible, or sit up as far as you can in bed or on a chair. Hold the incentive spirometer in an upright position. Breathe out normally. Place the mouthpiece in your mouth and seal your lips tightly around it. Breathe in slowly and as deeply as possible, raising the piston or the ball toward the top of the column. Hold your breath for 3-5 seconds or for as long as possible. Allow the piston or ball to fall to the bottom of the column. Remove the mouthpiece from your mouth and breathe out normally. Rest for a few seconds and repeat Steps 1 through 7 at least 10 times every 1-2 hours when you are awake. Take your time and take a few normal breaths between deep breaths. The spirometer may include an indicator to show your best effort. Use the indicator as a goal to work toward during each repetition. After each set of 10 deep breaths, practice coughing to be sure your lungs are clear. If you have an incision (the cut made at the time of surgery), support your incision when coughing by placing a pillow or rolled up towels firmly against it. Once you are able to get out of bed, walk around indoors and cough well. You may stop using the incentive spirometer when instructed by your caregiver.  RISKS AND COMPLICATIONS Take your time so you do not get dizzy or light-headed. If you are in pain, you may need to take or ask for pain medication before doing  incentive spirometry. It is harder to take a deep breath if you are having pain. AFTER USE Rest and breathe slowly and easily. It can be helpful to keep track of a log of your progress. Your caregiver can provide you with a simple table to help with this. If you are using the spirometer at home, follow these instructions: SEEK MEDICAL CARE IF:  You are having difficultly using the spirometer. You have trouble using the spirometer as often as instructed. Your pain medication is not giving enough relief while using the spirometer. You develop fever of 100.5 F (38.1 C) or higher. SEEK IMMEDIATE MEDICAL CARE IF:  You cough up bloody sputum that had not been present before. You develop fever of 102 F (38.9 C) or greater. You develop worsening pain at or near the incision site. MAKE SURE YOU:  Understand these instructions. Will watch your condition. Will get help right away if you are not doing well or get worse. Document Released: 05/24/2006 Document Revised: 04/05/2011 Document Reviewed: 07/25/2006 [facility] Patient Information 2014 [place], [place]. ________________________________________________________________________
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
## 2022-10-07 NOTE — Progress Notes (Signed)
Please send preop orders for PST visit 10/08/22
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
## 2022-10-07 NOTE — Progress Notes (Addendum)
COVID Vaccine received:  []  No [x]  Yes Date of any COVID positive Test in last 90 days: no PCP - Knox Royalty MD Cardiologist - Dr. Sharyn Lull  Chest x-ray -  EKG -  06/24/22 Epic Stress Test - 06/10/16 Epic ECHO - 09/05/18 Epic Cardiac Cath - 02/11/15 Epic  Bowel Prep - [x]  No  []   Yes ______  Pacemaker / ICD device [x]  No []  Yes   Spinal Cord Stimulator:[x]  No []  Yes       History of Sleep Apnea? [x]  No []  Yes   CPAP used?- []  No []  Yes    Does the patient monitor blood sugar?          [x]  No []  Yes  []  N/A  Patient has: [x]  NO Hx DM   []  Pre-DM                 []  DM1  []   DM2 Does patient have a [employer] or Dexacom? []  No []  Yes   Fasting Blood Sugar Ranges-  Checks Blood Sugar _____ times a day  GLP1 agonist / usual dose - no GLP1 instructions:  SGLT-2 inhibitors / usual dose -no  SGLT-2 instructions:   Blood Thinner / Instructions:no Aspirin Instructions:no  Comments:   Activity level: Patient is able  to climb a flight of stairs without difficulty; [x]  No CP  [x]  No SOB, but would have leg pain___   Patient can  perform ADLs without assistance.   Anesthesia review: HTN,HLD,CAD,smoker,atrial enlargement  Patient denies shortness of breath, fever, cough and chest pain at PAT appointment.  Patient verbalized understanding and agreement to the Pre-Surgical Instructions that were given to them at this PAT appointment. Patient was also educated of the need to review these PAT instructions again prior to his/her surgery.I reviewed the appropriate phone numbers to call if they have any and questions or concerns.

## 2022-10-08 ENCOUNTER — Other Ambulatory Visit: Payer: Self-pay

## 2022-10-08 ENCOUNTER — Encounter (HOSPITAL_COMMUNITY): Payer: Self-pay

## 2022-10-08 ENCOUNTER — Encounter (HOSPITAL_COMMUNITY)
Admission: RE | Admit: 2022-10-08 | Discharge: 2022-10-08 | Disposition: A | Discharge status: Home / Self Care | Payer: Medicaid Other | Source: Ambulatory Visit | Attending: Orthopedic Surgery | Admitting: Orthopedic Surgery

## 2022-10-08 VITALS — BP 142/100 | HR 64 | Temp 98.3°F | Resp 16 | Ht 65.0 in | Wt 174.0 lb

## 2022-10-08 DIAGNOSIS — Z01818 Encounter for other preprocedural examination: Secondary | ICD-10-CM | POA: Diagnosis present

## 2022-10-08 DIAGNOSIS — Z01812 Encounter for preprocedural laboratory examination: Secondary | ICD-10-CM | POA: Diagnosis not present

## 2022-10-08 DIAGNOSIS — M79605 Pain in left leg: Secondary | ICD-10-CM | POA: Insufficient documentation

## 2022-10-08 DIAGNOSIS — I10 Essential (primary) hypertension: Secondary | ICD-10-CM | POA: Diagnosis not present

## 2022-10-08 DIAGNOSIS — T8484XS Pain due to internal orthopedic prosthetic devices, implants and grafts, sequela: Secondary | ICD-10-CM | POA: Insufficient documentation

## 2022-10-08 HISTORY — DX: Essential (primary) hypertension: I10

## 2022-10-08 LAB — CBC WITH DIFFERENTIAL/PLATELET
Abs Immature Granulocytes: 0.02 10*3/uL (ref 0.00–0.07)
Basophils Absolute: 0 10*3/uL (ref 0.0–0.1)
Basophils Relative: 1 %
Eosinophils Absolute: 0.2 10*3/uL (ref 0.0–0.5)
Eosinophils Relative: 3 %
HCT: 37.4 % (ref 36.0–46.0)
Hemoglobin: 12.1 g/dL (ref 12.0–15.0)
Immature Granulocytes: 0 %
Lymphocytes Relative: 33 %
Lymphs Abs: 2.9 10*3/uL (ref 0.7–4.0)
MCH: 29.8 pg (ref 26.0–34.0)
MCHC: 32.4 g/dL (ref 30.0–36.0)
MCV: 92.1 fL (ref 80.0–100.0)
Monocytes Absolute: 0.7 10*3/uL (ref 0.1–1.0)
Monocytes Relative: 8 %
Neutro Abs: 4.9 10*3/uL (ref 1.7–7.7)
Neutrophils Relative %: 55 %
Platelets: 224 10*3/uL (ref 150–400)
RBC: 4.06 MIL/uL (ref 3.87–5.11)
RDW: 12.7 % (ref 11.5–15.5)
WBC: 8.8 10*3/uL (ref 4.0–10.5)
nRBC: 0 % (ref 0.0–0.2)

## 2022-10-08 LAB — BASIC METABOLIC PANEL
Anion gap: 10 (ref 5–15)
BUN: 7 mg/dL — ABNORMAL LOW (ref 8–23)
CO2: 26 mmol/L (ref 22–32)
Calcium: 8.6 mg/dL — ABNORMAL LOW (ref 8.9–10.3)
Chloride: 102 mmol/L (ref 98–111)
Creatinine, Ser: 0.62 mg/dL (ref 0.44–1.00)
GFR, Estimated: >60 mL/min (ref >60)
Glucose, Bld: 104 mg/dL — ABNORMAL HIGH (ref 70–99)
Potassium: 3 mmol/L — ABNORMAL LOW (ref 3.5–5.1)
Sodium: 138 mmol/L (ref 135–145)

## 2022-10-08 LAB — SURGICAL PCR SCREEN
MRSA, PCR: NEGATIVE
Staphylococcus aureus: NEGATIVE
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
## 2022-10-14 NOTE — Anesthesia Preprocedure Evaluation (Addendum)
Anesthesia Evaluation  Patient identified by MRN, date of birth, ID band Patient awake    Reviewed: Allergy & Precautions, NPO status , Patient's Chart, lab work & pertinent test results, reviewed documented beta blocker date and time   History of Anesthesia Complications Negative for: history of anesthetic complications  Airway Mallampati: II  TM Distance: >3 FB Neck ROM: Full    Dental  (+) Poor Dentition, Chipped, Missing, Dental Advisory Given,    Pulmonary asthma , COPD,  COPD inhaler, Current Smoker and Patient abstained from smoking.   Pulmonary exam normal breath sounds clear to auscultation       Cardiovascular hypertension, Pt. on medications and Pt. on home beta blockers + CAD  Normal cardiovascular exam Rhythm:Regular Rate:Normal  TTE 2020 1. The left ventricle has normal systolic function with an ejection  fraction of 60-65%. The cavity size was normal. There is mild asymmetric  left ventricular hypertrophy. Left ventricular diastolic parameters were  normal.   2. The right ventricle has normal systolic function. The cavity was  normal. There is no increase in right ventricular wall thickness.   3. Left atrial size was mildly dilated.   4. The mitral valve was not well visualized. Mild thickening of the  mitral valve leaflet.   5. The tricuspid valve is grossly normal.   6. The aortic valve is tricuspid. Mild thickening of the aortic valve.  Sclerosis without any evidence of stenosis of the aortic valve.   7. The aorta is normal in size and structure.     Neuro/Psych  Headaches  Anxiety Depression    TIA   GI/Hepatic hiatal hernia,GERD  ,,(+)     substance abuse  alcohol use  Endo/Other  negative endocrine ROS    Renal/GU negative Renal ROS     Musculoskeletal  (+) Arthritis ,    Abdominal   Peds  Hematology negative hematology ROS (+)   Anesthesia Other Findings Day of surgery medications  reviewed with patient.  Reproductive/Obstetrics                             Anesthesia Physical Anesthesia Plan  ASA: 3  Anesthesia Plan: General   Post-op Pain Management: Tylenol PO (pre-op)*   Induction: Intravenous  PONV Risk Score and Plan: 2 and Ondansetron, Dexamethasone, Treatment may vary due to age or medical condition and Midazolam  Airway Management Planned: Oral ETT  Additional Equipment: None  Intra-op Plan:   Post-operative Plan: Extubation in OR  Informed Consent:   Plan Discussed with:   Anesthesia Plan Comments: (See PAT note from 9/13 by Sherlie Ban PA-C )        Anesthesia Quick Evaluation

## 2022-10-20 ENCOUNTER — Encounter (HOSPITAL_COMMUNITY): Payer: Self-pay | Admitting: Orthopedic Surgery

## 2022-10-20 ENCOUNTER — Ambulatory Visit (HOSPITAL_COMMUNITY): Payer: Medicaid Other

## 2022-10-20 ENCOUNTER — Ambulatory Visit (HOSPITAL_COMMUNITY)
Admission: RE | Admit: 2022-10-20 | Discharge: 2022-10-20 | Disposition: A | Discharge status: Home / Self Care | Payer: Medicaid Other | Source: Ambulatory Visit | Attending: Orthopedic Surgery | Admitting: Orthopedic Surgery

## 2022-10-20 ENCOUNTER — Encounter (HOSPITAL_COMMUNITY)
Admission: RE | Disposition: A | Discharge status: Home / Self Care | Payer: Self-pay | Source: Ambulatory Visit | Attending: Orthopedic Surgery

## 2022-10-20 ENCOUNTER — Ambulatory Visit (HOSPITAL_COMMUNITY): Payer: Medicaid Other | Admitting: Medical

## 2022-10-20 ENCOUNTER — Other Ambulatory Visit: Payer: Self-pay

## 2022-10-20 ENCOUNTER — Ambulatory Visit (HOSPITAL_BASED_OUTPATIENT_CLINIC_OR_DEPARTMENT_OTHER): Payer: Medicaid Other | Admitting: Anesthesiology

## 2022-10-20 DIAGNOSIS — F17200 Nicotine dependence, unspecified, uncomplicated: Secondary | ICD-10-CM | POA: Insufficient documentation

## 2022-10-20 DIAGNOSIS — Y848 Other medical procedures as the cause of abnormal reaction of the patient, or of later complication, without mention of misadventure at the time of the procedure: Secondary | ICD-10-CM | POA: Diagnosis not present

## 2022-10-20 DIAGNOSIS — F418 Other specified anxiety disorders: Secondary | ICD-10-CM

## 2022-10-20 DIAGNOSIS — J449 Chronic obstructive pulmonary disease, unspecified: Secondary | ICD-10-CM | POA: Diagnosis not present

## 2022-10-20 DIAGNOSIS — K219 Gastro-esophageal reflux disease without esophagitis: Secondary | ICD-10-CM | POA: Insufficient documentation

## 2022-10-20 DIAGNOSIS — T8484XA Pain due to internal orthopedic prosthetic devices, implants and grafts, initial encounter: Secondary | ICD-10-CM | POA: Diagnosis not present

## 2022-10-20 DIAGNOSIS — I2510 Atherosclerotic heart disease of native coronary artery without angina pectoris: Secondary | ICD-10-CM | POA: Diagnosis not present

## 2022-10-20 DIAGNOSIS — J4489 Other specified chronic obstructive pulmonary disease: Secondary | ICD-10-CM | POA: Insufficient documentation

## 2022-10-20 DIAGNOSIS — F17210 Nicotine dependence, cigarettes, uncomplicated: Secondary | ICD-10-CM

## 2022-10-20 DIAGNOSIS — I10 Essential (primary) hypertension: Secondary | ICD-10-CM | POA: Insufficient documentation

## 2022-10-20 HISTORY — PX: HARDWARE REMOVAL: SHX979

## 2022-10-20 LAB — BASIC METABOLIC PANEL
Anion gap: 9 (ref 5–15)
BUN: 5 mg/dL — ABNORMAL LOW (ref 8–23)
CO2: 21 mmol/L — ABNORMAL LOW (ref 22–32)
Calcium: 8.3 mg/dL — ABNORMAL LOW (ref 8.9–10.3)
Chloride: 106 mmol/L (ref 98–111)
Creatinine, Ser: 0.41 mg/dL — ABNORMAL LOW (ref 0.44–1.00)
GFR, Estimated: >60 mL/min (ref >60)
Glucose, Bld: 123 mg/dL — ABNORMAL HIGH (ref 70–99)
Potassium: 3.7 mmol/L (ref 3.5–5.1)
Sodium: 136 mmol/L (ref 135–145)

## 2022-10-20 LAB — CBC
HCT: 36.7 % (ref 36.0–46.0)
Hemoglobin: 11.6 g/dL — ABNORMAL LOW (ref 12.0–15.0)
MCH: 29.7 pg (ref 26.0–34.0)
MCHC: 31.6 g/dL (ref 30.0–36.0)
MCV: 94.1 fL (ref 80.0–100.0)
Platelets: 218 10*3/uL (ref 150–400)
RBC: 3.9 MIL/uL (ref 3.87–5.11)
RDW: 12.7 % (ref 11.5–15.5)
WBC: 11.7 10*3/uL — ABNORMAL HIGH (ref 4.0–10.5)
nRBC: 0 % (ref 0.0–0.2)

## 2022-10-20 SURGERY — REMOVAL, HARDWARE
Anesthesia: General | Laterality: Left

## 2022-10-20 MED ORDER — BUPIVACAINE-EPINEPHRINE (PF) 0.25% -1:200000 IJ SOLN
INTRAMUSCULAR | Status: DC | PRN
  Administered 2022-10-20: 30 mL via PERINEURAL

## 2022-10-20 MED ORDER — CHLORHEXIDINE GLUCONATE 0.12 % MT SOLN
15.0000 mL | Freq: Once | OROMUCOSAL | Status: AC
  Administered 2022-10-20: 15 mL via OROMUCOSAL

## 2022-10-20 MED ORDER — FENTANYL CITRATE (PF) 100 MCG/2ML IJ SOLN
INTRAMUSCULAR | Status: AC
  Filled 2022-10-20: qty 2

## 2022-10-20 MED ORDER — KETAMINE HCL 10 MG/ML IJ SOLN
INTRAMUSCULAR | Status: DC | PRN
  Administered 2022-10-20: 50 mg via INTRAVENOUS

## 2022-10-20 MED ORDER — POVIDONE-IODINE 10 % EX SWAB: 2.0000 | Freq: Once | CUTANEOUS | Status: DC

## 2022-10-20 MED ORDER — HYDROMORPHONE HCL 1 MG/ML IJ SOLN
INTRAMUSCULAR | Status: AC
  Filled 2022-10-20: qty 1

## 2022-10-20 MED ORDER — CEFAZOLIN SODIUM-DEXTROSE 2-4 GM/100ML-% IV SOLN
2.0000 g | INTRAVENOUS | Status: AC
  Administered 2022-10-20: 2 g via INTRAVENOUS
  Filled 2022-10-20: qty 100

## 2022-10-20 MED ORDER — BUPIVACAINE-EPINEPHRINE 0.25% -1:200000 IJ SOLN
INTRAMUSCULAR | Status: AC
  Filled 2022-10-20: qty 1

## 2022-10-20 MED ORDER — PHENYLEPHRINE HCL (PRESSORS) 10 MG/ML IV SOLN
INTRAVENOUS | Status: AC
  Filled 2022-10-20: qty 1

## 2022-10-20 MED ORDER — EPHEDRINE SULFATE-NACL 50-0.9 MG/10ML-% IV SOSY
PREFILLED_SYRINGE | INTRAVENOUS | Status: DC | PRN
  Administered 2022-10-20 (×2): 5 mg via INTRAVENOUS

## 2022-10-20 MED ORDER — TRANEXAMIC ACID-NACL 1000-0.7 MG/100ML-% IV SOLN
1000.0000 mg | INTRAVENOUS | Status: AC
  Administered 2022-10-20: 1000 mg via INTRAVENOUS
  Filled 2022-10-20: qty 100

## 2022-10-20 MED ORDER — BUPIVACAINE LIPOSOME 1.3 % IJ SUSP
INTRAMUSCULAR | Status: AC
  Filled 2022-10-20: qty 20

## 2022-10-20 MED ORDER — DEXMEDETOMIDINE HCL IN NACL 80 MCG/20ML IV SOLN
INTRAVENOUS | Status: DC | PRN
Start: 2022-10-20 — End: 2022-10-20
  Administered 2022-10-20: 8 ug via INTRAVENOUS

## 2022-10-20 MED ORDER — ROCURONIUM BROMIDE 10 MG/ML (PF) SYRINGE
PREFILLED_SYRINGE | INTRAVENOUS | Status: AC
  Filled 2022-10-20: qty 10

## 2022-10-20 MED ORDER — SODIUM CHLORIDE 0.9 % IR SOLN
Status: DC | PRN
  Administered 2022-10-20: 3000 mL

## 2022-10-20 MED ORDER — DEXAMETHASONE SODIUM PHOSPHATE 10 MG/ML IJ SOLN
INTRAMUSCULAR | Status: AC
  Filled 2022-10-20: qty 1

## 2022-10-20 MED ORDER — OXYCODONE HCL 5 MG/5ML PO SOLN: 5.0000 mg | Freq: Once | ORAL | Status: AC | PRN

## 2022-10-20 MED ORDER — HYDROCODONE-ACETAMINOPHEN 10-300 MG PO TABS: 1.0000 | ORAL_TABLET | ORAL | 0 refills | Status: AC | PRN

## 2022-10-20 MED ORDER — FENTANYL CITRATE PF 50 MCG/ML IJ SOSY
25.0000 ug | PREFILLED_SYRINGE | INTRAMUSCULAR | Status: DC | PRN
  Administered 2022-10-20 (×3): 50 ug via INTRAVENOUS

## 2022-10-20 MED ORDER — METHOCARBAMOL 500 MG PO TABS: 500.0000 mg | ORAL_TABLET | Freq: Three times a day (TID) | ORAL | 0 refills | Status: AC | PRN

## 2022-10-20 MED ORDER — 0.9 % SODIUM CHLORIDE (POUR BTL) OPTIME
TOPICAL | Status: DC | PRN
  Administered 2022-10-20: 1000 mL

## 2022-10-20 MED ORDER — VANCOMYCIN HCL 1000 MG IV SOLR
INTRAVENOUS | Status: AC
  Filled 2022-10-20: qty 20

## 2022-10-20 MED ORDER — ACETAMINOPHEN 500 MG PO TABS: 1000.0000 mg | ORAL_TABLET | Freq: Three times a day (TID) | ORAL | Status: AC | PRN

## 2022-10-20 MED ORDER — OXYCODONE HCL 5 MG PO TABS
5.0000 mg | ORAL_TABLET | Freq: Once | ORAL | Status: AC | PRN
  Administered 2022-10-20: 5 mg via ORAL

## 2022-10-20 MED ORDER — OXYCODONE HCL 5 MG PO TABS
ORAL_TABLET | ORAL | Status: AC
  Filled 2022-10-20: qty 1

## 2022-10-20 MED ORDER — PROPOFOL 10 MG/ML IV BOLUS
INTRAVENOUS | Status: AC
  Filled 2022-10-20: qty 20

## 2022-10-20 MED ORDER — ACETAMINOPHEN 500 MG PO TABS
1000.0000 mg | ORAL_TABLET | Freq: Once | ORAL | Status: AC
  Administered 2022-10-20: 1000 mg via ORAL
  Filled 2022-10-20: qty 2

## 2022-10-20 MED ORDER — DEXAMETHASONE SODIUM PHOSPHATE 10 MG/ML IJ SOLN: 8.0000 mg | Freq: Once | INTRAMUSCULAR | Status: DC

## 2022-10-20 MED ORDER — MIDAZOLAM HCL 2 MG/2ML IJ SOLN
INTRAMUSCULAR | Status: AC
  Filled 2022-10-20: qty 2

## 2022-10-20 MED ORDER — FENTANYL CITRATE PF 50 MCG/ML IJ SOSY
PREFILLED_SYRINGE | INTRAMUSCULAR | Status: AC
  Filled 2022-10-20: qty 3

## 2022-10-20 MED ORDER — CHLORHEXIDINE GLUCONATE 4 % EX SOLN: 60.0000 mL | Freq: Once | CUTANEOUS | Status: DC

## 2022-10-20 MED ORDER — VANCOMYCIN HCL 1000 MG IV SOLR
INTRAVENOUS | Status: DC | PRN
Start: 2022-10-20 — End: 2022-10-20
  Administered 2022-10-20: 1000 mg

## 2022-10-20 MED ORDER — ONDANSETRON HCL 4 MG/2ML IJ SOLN
INTRAMUSCULAR | Status: AC
  Filled 2022-10-20: qty 2

## 2022-10-20 MED ORDER — KETAMINE HCL 50 MG/5ML IJ SOSY
PREFILLED_SYRINGE | INTRAMUSCULAR | Status: AC
  Filled 2022-10-20: qty 5

## 2022-10-20 MED ORDER — EPHEDRINE 5 MG/ML INJ
INTRAVENOUS | Status: AC
  Filled 2022-10-20: qty 5

## 2022-10-20 MED ORDER — ONDANSETRON HCL 4 MG/2ML IJ SOLN
INTRAMUSCULAR | Status: DC | PRN
Start: 2022-10-20 — End: 2022-10-20
  Administered 2022-10-20: 4 mg via INTRAVENOUS

## 2022-10-20 MED ORDER — DROPERIDOL 2.5 MG/ML IJ SOLN: 0.6250 mg | Freq: Once | INTRAMUSCULAR | Status: DC | PRN

## 2022-10-20 MED ORDER — FENTANYL CITRATE (PF) 100 MCG/2ML IJ SOLN
INTRAMUSCULAR | Status: DC | PRN
  Administered 2022-10-20 (×4): 50 ug via INTRAVENOUS

## 2022-10-20 MED ORDER — LACTATED RINGERS IV SOLN: INTRAVENOUS | Status: DC

## 2022-10-20 MED ORDER — LIDOCAINE HCL (PF) 2 % IJ SOLN
INTRAMUSCULAR | Status: AC
  Filled 2022-10-20: qty 5

## 2022-10-20 MED ORDER — SODIUM CHLORIDE (PF) 0.9 % IJ SOLN
INTRAMUSCULAR | Status: AC
  Filled 2022-10-20: qty 50

## 2022-10-20 MED ORDER — SUGAMMADEX SODIUM 200 MG/2ML IV SOLN
INTRAVENOUS | Status: DC | PRN
  Administered 2022-10-20: 160 mg via INTRAVENOUS

## 2022-10-20 MED ORDER — DEXMEDETOMIDINE HCL IN NACL 80 MCG/20ML IV SOLN
INTRAVENOUS | Status: AC
  Filled 2022-10-20: qty 20

## 2022-10-20 MED ORDER — HYDROMORPHONE HCL 1 MG/ML IJ SOLN: 0.2500 mg | INTRAMUSCULAR | Status: DC | PRN

## 2022-10-20 MED ORDER — MIDAZOLAM HCL 2 MG/2ML IJ SOLN
INTRAMUSCULAR | Status: DC | PRN
  Administered 2022-10-20: 2 mg via INTRAVENOUS

## 2022-10-20 MED ORDER — HYDROMORPHONE HCL 1 MG/ML IJ SOLN
0.2500 mg | INTRAMUSCULAR | Status: DC | PRN
  Administered 2022-10-20 (×4): 0.5 mg via INTRAVENOUS

## 2022-10-20 MED ORDER — ROCURONIUM BROMIDE 10 MG/ML (PF) SYRINGE
PREFILLED_SYRINGE | INTRAVENOUS | Status: DC | PRN
  Administered 2022-10-20: 50 mg via INTRAVENOUS

## 2022-10-20 MED ORDER — HYDROMORPHONE HCL 1 MG/ML IJ SOLN
INTRAMUSCULAR | Status: DC | PRN
Start: 2022-10-20 — End: 2022-10-20
  Administered 2022-10-20 (×5): .4 mg via INTRAVENOUS

## 2022-10-20 MED ORDER — POLYETHYLENE GLYCOL 3350 17 G PO PACK: 17.0000 g | PACK | Freq: Every day | ORAL | 0 refills | Status: AC

## 2022-10-20 MED ORDER — ISOPROPYL ALCOHOL 70 % SOLN
Status: DC | PRN
  Administered 2022-10-20: 1 via TOPICAL

## 2022-10-20 MED ORDER — SODIUM CHLORIDE (PF) 0.9 % IJ SOLN
INTRAMUSCULAR | Status: AC
  Filled 2022-10-20: qty 10

## 2022-10-20 MED ORDER — ISOPROPYL ALCOHOL 70 % SOLN
Status: AC
  Filled 2022-10-20: qty 480

## 2022-10-20 MED ORDER — ORAL CARE MOUTH RINSE: 15.0000 mL | Freq: Once | OROMUCOSAL | Status: AC

## 2022-10-20 MED ORDER — PROPOFOL 10 MG/ML IV BOLUS
INTRAVENOUS | Status: DC | PRN
  Administered 2022-10-20: 50 mg via INTRAVENOUS
  Administered 2022-10-20: 150 mg via INTRAVENOUS

## 2022-10-20 MED ORDER — LIDOCAINE 2% (20 MG/ML) 5 ML SYRINGE
INTRAMUSCULAR | Status: DC | PRN
  Administered 2022-10-20: 100 mg via INTRAVENOUS

## 2022-10-20 MED ORDER — APIXABAN 2.5 MG PO TABS: 2.5000 mg | ORAL_TABLET | Freq: Two times a day (BID) | ORAL | 0 refills | Status: AC

## 2022-10-20 MED ORDER — SODIUM CHLORIDE 0.9% FLUSH
INTRAVENOUS | Status: DC | PRN
  Administered 2022-10-20: 30 mL via INTRAVENOUS

## 2022-10-20 MED ORDER — OXYCODONE HCL 5 MG PO TABS
5.0000 mg | ORAL_TABLET | ORAL | 0 refills | Status: DC | PRN
Start: 2022-10-20 — End: 2022-10-20

## 2022-10-20 MED ORDER — HYDROMORPHONE HCL 2 MG/ML IJ SOLN
INTRAMUSCULAR | Status: AC
  Filled 2022-10-20: qty 1

## 2022-10-20 MED ORDER — DEXAMETHASONE SODIUM PHOSPHATE 10 MG/ML IJ SOLN
INTRAMUSCULAR | Status: DC | PRN
  Administered 2022-10-20: 10 mg via INTRAVENOUS

## 2022-10-20 MED ORDER — BUPIVACAINE LIPOSOME 1.3 % IJ SUSP
INTRAMUSCULAR | Status: DC | PRN
  Administered 2022-10-20: 20 mL

## 2022-10-20 SURGICAL SUPPLY — 65 items
ADH SKN CLS APL DERMABOND .7 (GAUZE/BANDAGES/DRESSINGS)
APL PRP STRL LF DISP 70% ISPRP (MISCELLANEOUS) ×1
BAG COUNTER SPONGE SURGICOUNT (BAG) IMPLANT
BAG SPNG CNTER NS LX DISP (BAG)
BLADE HEX COATED 2.75 (ELECTRODE) ×2 IMPLANT
BLADE SAG 18X100X1.27 (BLADE) ×2 IMPLANT
BLADE SAW SAG 35X64 .89 (BLADE) ×2 IMPLANT
BLADE SAW SGTL 81X20 HD (BLADE) IMPLANT
BNDG CMPR 5X4 CHSV STRCH STRL (GAUZE/BANDAGES/DRESSINGS)
BNDG CMPR MED 10X6 ELC LF (GAUZE/BANDAGES/DRESSINGS)
BNDG COHESIVE 4X5 TAN STRL LF (GAUZE/BANDAGES/DRESSINGS) ×2 IMPLANT
BNDG ELASTIC 6X10 VLCR STRL LF (GAUZE/BANDAGES/DRESSINGS) ×2 IMPLANT
BOWL SMART MIX CTS (DISPOSABLE) ×2 IMPLANT
BUR EGG DIAMOND 4.0 (BURR) IMPLANT
CANISTER WOUND CARE 500ML ATS (WOUND CARE) ×2 IMPLANT
CHLORAPREP W/TINT 26 (MISCELLANEOUS) ×4 IMPLANT
CNTNR URN SCR LID CUP LEK RST (MISCELLANEOUS) IMPLANT
CONT SPEC 4OZ STRL OR WHT (MISCELLANEOUS)
COVER SURGICAL LIGHT HANDLE (MISCELLANEOUS) ×2 IMPLANT
CUFF TOURN SGL QUICK 34 (TOURNIQUET CUFF)
CUFF TRNQT CYL 34X4.125X (TOURNIQUET CUFF) ×2 IMPLANT
DERMABOND ADVANCED .7 DNX12 (GAUZE/BANDAGES/DRESSINGS) ×2 IMPLANT
DRAPE INCISE IOBAN 66X45 STRL (DRAPES) IMPLANT
DRAPE INCISE IOBAN 85X60 (DRAPES) ×2 IMPLANT
DRAPE SHEET LG 3/4 BI-LAMINATE (DRAPES) ×2 IMPLANT
DRAPE U-SHAPE 47X51 STRL (DRAPES) ×2 IMPLANT
DRESSING AQUACEL AG SP 3.5X10 (GAUZE/BANDAGES/DRESSINGS) ×2 IMPLANT
DRESSING PEEL AND PLAC PRVNA20 (GAUZE/BANDAGES/DRESSINGS) ×2 IMPLANT
DRSG AQUACEL AG SP 3.5X10 (GAUZE/BANDAGES/DRESSINGS)
DRSG MEPILEX POST OP 4X8 (GAUZE/BANDAGES/DRESSINGS) IMPLANT
DRSG PEEL AND PLACE PREVENA 20 (GAUZE/BANDAGES/DRESSINGS)
GLOVE BIO SURGEON STRL SZ 6.5 (GLOVE) ×4 IMPLANT
GLOVE BIOGEL PI IND STRL 6.5 (GLOVE) ×2 IMPLANT
GLOVE BIOGEL PI IND STRL 8 (GLOVE) ×2 IMPLANT
GLOVE SURG ORTHO 8.0 STRL STRW (GLOVE) ×4 IMPLANT
GOWN STRL REUS W/ TWL XL LVL3 (GOWN DISPOSABLE) ×2 IMPLANT
GOWN STRL REUS W/TWL XL LVL3 (GOWN DISPOSABLE) ×1
HANDPIECE INTERPULSE COAX TIP (DISPOSABLE)
HOLDER FOLEY CATH W/STRAP (MISCELLANEOUS) IMPLANT
HOOD PEEL AWAY T7 (MISCELLANEOUS) ×6 IMPLANT
KIT DRSG PREVENA PLUS 7DAY 125 (MISCELLANEOUS) ×2 IMPLANT
KIT TURNOVER KIT A (KITS) IMPLANT
MANIFOLD NEPTUNE II (INSTRUMENTS) ×2 IMPLANT
MARKER SKIN DUAL TIP RULER LAB (MISCELLANEOUS) ×4 IMPLANT
NS IRRIG 1000ML POUR BTL (IV SOLUTION) ×2 IMPLANT
PACK TOTAL KNEE CUSTOM (KITS) ×2 IMPLANT
PROTECTOR NERVE ULNAR (MISCELLANEOUS) ×2 IMPLANT
SET HNDPC FAN SPRY TIP SCT (DISPOSABLE) ×2 IMPLANT
SOLUTION IRRIG SURGIPHOR (IV SOLUTION) ×2 IMPLANT
SPIKE FLUID TRANSFER (MISCELLANEOUS) ×2 IMPLANT
SUT ETHILON 3 0 PS 1 (SUTURE) ×8 IMPLANT
SUT MNCRL AB 3-0 PS2 18 (SUTURE) ×2 IMPLANT
SUT STRATAFIX 0 PDS 27 VIOLET (SUTURE)
SUT STRATAFIX 1PDS 45CM VIOLET (SUTURE) ×2 IMPLANT
SUT STRATAFIX PDO 1 14 VIOLET (SUTURE)
SUT STRATFX PDO 1 14 VIOLET (SUTURE)
SUT VIC AB 2-0 CT2 27 (SUTURE) ×4 IMPLANT
SUTURE STRATFX 0 PDS 27 VIOLET (SUTURE) ×2 IMPLANT
SUTURE STRATFX PDO 1 14 VIOLET (SUTURE) ×2 IMPLANT
SYR 50ML LL SCALE MARK (SYRINGE) ×2 IMPLANT
TRAY FOLEY MTR SLVR 14FR STAT (SET/KITS/TRAYS/PACK) IMPLANT
TRAY FOLEY MTR SLVR 16FR STAT (SET/KITS/TRAYS/PACK) IMPLANT
TUBE SUCTION HIGH CAP CLEAR NV (SUCTIONS) ×2 IMPLANT
UNDERPAD 30X36 HEAVY ABSORB (UNDERPADS AND DIAPERS) ×2 IMPLANT
WRAP KNEE MAXI GEL POST OP (GAUZE/BANDAGES/DRESSINGS) IMPLANT

## 2022-10-20 NOTE — Op Note (Signed)
10/20/2022  4:01 PM  PATIENT:  Meghan Bowman    PRE-OPERATIVE DIAGNOSIS:  LEFT FEMUR PAINFUL HARDWARE  POST-OPERATIVE DIAGNOSIS:  Same  PROCEDURE: Removal of left distal femoral locking plate and screws  SURGEON:  Emberli Ballester A Katherin Ramey, MD  PHYSICIAN ASSISTANT: Kathie Dike, PA-C, present and scrubbed throughout the case, critical for completion in a timely fashion, and for retraction, instrumentation, and closure.  ANESTHESIA:   General  PREOPERATIVE INDICATIONS:  Meghan Bowman is a  62 y.o. female who had sustained a periprosthetic left distal femur fracture in November 2023 almost a year ago.  She continued to have left lateral hip pain even after the fracture had healed.  CT scan was obtained that confirmed healing of the fracture.  Given lateral hip pain associated with the hardware that continued despite conservative management including physical therapy elected to proceed with hardware removal.  The risks benefits and alternatives were discussed with the patient preoperatively including but not limited to the risks of infection, bleeding, nerve injury, cardiopulmonary complications, the need for revision surgery, among others, and the patient was willing to proceed.  ESTIMATED BLOOD LOSS: 200cc  OPERATIVE FINDINGS: Removal of [employer] NCB plate and screws.  OPERATIVE PROCEDURE:  The patient was brought to the operating room.  She was positioned supine on the OR table.  General anesthesia was induced.  The left lower extremity was prepped and draped in a sterile fashion.  No tourniquet was applied.  2 g of Ancef were given.  Timeout was performed.  Utilizing the patient's old lateral based incisions a longitudinal incision was made through the old scars.  The IT band was identified laterally and incised.  The plate was exposed deep to the IT band and adhesions and scar tissue were removed to expose the screws.  The distal locking and caps were removed.  One of the distal  locking and caps was cold welded and stripped during attempted hardware removal.  The Shuk[employer] hardware extractor reverse threaded conical screw extractor was used to remove this and.  The screws were removed after the the locking caps were removed.  2 of the distal screws unfortunately were unable to be removed because they were spinning freely but were not backing out of the plate.  We next turned our attention to removal of the proximal screws.  The proximal percutaneous incisions were connected and a longitudinal incision was made.  The IT band and vastus lateralis were split down to the bone.  The proximal and Screws Were Able to Be Removed without Difficulty.  At This Point We Were Able to Gently Pry the Plate off of the Bone Laterally.  The 2 Distal Screws That Were Stuck in the Plate Were Able to Back out and We Are Able to Free Them from the Plate and Remove Them.  Fluoroscopy Confirmed That All Hardware Had Been 6 That Successfully Removed.  The Incisions Were Irrigated with Betadine and Normal Saline.  1 G of Vancomycin Powder Was Placed into the Wounds.  The Wounds Were Injected with Exparel, 30 Cc of 0.25% Marcaine with Epinephrine, and Saline.  The patient was awoken from anesthesia and taken to PACU in stable condition.  Post op recs: WB: WBAT LLE Abx: ancef   Imaging: PACU xrays Dressing: keep intact until follow up, change PRN if soiled or saturated. DVT prophylaxis: Eliquis 2.5 mg twice daily x 4 weeks Follow up: 2 weeks after surgery for a wound check with Dr. Blanchie Dessert at [facility].  Address: [address], [place], [place] [address]  Office Phone: [phone]  Weber Cooks, MD Orthopaedic Surgery

## 2022-10-20 NOTE — Interval H&P Note (Signed)
The patient has been re-examined, and the chart reviewed, and there have been no interval changes to the documented history and physical.    Plan for Left leg hardware removal.  The operative side was examined and the patient was confirmed to have sensation to DPN, SPN, TN intact, Motor EHL, ext, flex 5/5, and DP 2+, PT 2+, No significant edema.   The risks, benefits, and alternatives have been discussed at length with patient, and the patient is willing to proceed.  Left hip marked. Consent has been signed.

## 2022-10-20 NOTE — Transfer of Care (Signed)
Immediate Anesthesia Transfer of Care Note  Patient: [name]  Procedure(s) Performed: HARDWARE REMOVAL (Left)  Patient Location: PACU  Anesthesia Type:General  Level of Consciousness: awake, alert , and oriented  Airway & Oxygen Therapy: Patient Spontanous Breathing and Patient connected to face mask oxygen  Post-op Assessment: Report given to RN, Post -op Vital signs reviewed and stable, and Patient moving all extremities X 4  Post vital signs: Reviewed and stable  Last Vitals:  Vitals Value Taken Time  BP 153/85 10/20/22 1551  Temp    Pulse 124 10/20/22 1553  Resp 21 10/20/22 1553  SpO2 99 % 10/20/22 1553  Vitals shown include unfiled device data.  Last Pain:  Vitals:   10/20/22 1119  TempSrc: Oral         Complications: No notable events documented.

## 2022-10-20 NOTE — Anesthesia Procedure Notes (Signed)
Procedure Name: Intubation Date/Time: 10/20/2022 1:31 PM  Performed by: Florene Route, CRNAPre-anesthesia Checklist: Patient identified, Emergency Drugs available, Suction available and Patient being monitored Patient Re-evaluated:Patient Re-evaluated prior to induction Oxygen Delivery Method: Circle system utilized Preoxygenation: Pre-oxygenation with 100% oxygen Induction Type: IV induction Ventilation: Mask ventilation without difficulty Laryngoscope Size: Miller and 2 Grade View: Grade I Tube type: Oral Tube size: 7.5 mm Number of attempts: 1 Airway Equipment and Method: Stylet Placement Confirmation: ETT inserted through vocal cords under direct vision, positive ETCO2 and breath sounds checked- equal and bilateral Secured at: 22 cm Tube secured with: Tape Dental Injury: Teeth and Oropharynx as per pre-operative assessment

## 2022-10-20 NOTE — Discharge Instructions (Addendum)
Orthopedic Discharge Instructions  Diet: As you were doing prior to hospitalization   Shower:  May shower but keep the wounds dry, use an occlusive plastic wrap, NO SOAKING IN TUB.  If the bandage gets wet, change with a clean dry gauze.  If you have a splint on, leave the splint in place and keep the splint dry with a plastic bag.  Dressing:  You may change your dressing 3-5 days after surgery.  If the dressing remains clean and dry it can also be left on until follow up.  If you change the dressing replace with clean gauze and tape or ace wrap.  For most surgeries, the stitches used are dissolvable and don't need to be removed.  However, depending on your surgery, you may have stitches that will need to be removed in the office in about 2 weeks.    Activity:  Increase activity slowly as tolerated, but follow the weight bearing instructions below.  Do not drive for the next 2 weeks.  In addition, you cannot be taking narcotics while you drive, and you must feel in control of the vehicle.    Weight Bearing:   You may bear weight on your surgical leg as tolerated.    Blood clot prevention (DVT Prophylaxis): After surgery you are at an increased risk for a blood clot. you were prescribed a blood thinner, aspirin 81 mg, to be taken twice daily for a total of 4 weeks from surgery to help reduce your risk of getting a blood clot. Signs of a pulmonary embolus (blood clot in the lungs) include sudden short of breath, feeling lightheaded or dizzy, chest pain with a deep breath, rapid pulse rapid breathing.  Signs of a blood clot in your arms or legs include new unexplained swelling and cramping, warm, red or darkened skin around the painful area.  Please call the office or 911 right away if these signs or symptoms develop.  To prevent constipation: you may use a stool softener such as - Colace (over the counter) 100 mg by mouth twice a day  Drink plenty of fluids (prune juice may be helpful) and high fiber  foods Miralax (over the counter) for constipation as needed.    Itching:  If you experience itching with your medications, try taking only a single pain pill, or even half a pain pill at a time.  You may take up to 10 pain pills per day, and you can also use benadryl over the counter for itching or also to help with sleep.   Precautions:  If you experience chest pain or shortness of breath - call 911 immediately for transfer to the hospital emergency department!!  Call office [phone]) for the following: Temperature greater than 101F Persistent nausea and vomiting Severe uncontrolled pain Redness, tenderness, or signs of infection (pain, swelling, redness, odor or green/yellow discharge around the site) Difficulty breathing, headache or visual disturbances Hives Persistent dizziness or light-headedness Extreme fatigue Any other questions or concerns you may have after discharge  In an emergency, call 911 or go to an Emergency Department at a nearby hospital  Follow- Up Appointment:  Please call for an appointment to be seen approximately 2-3 week after surgery in [facility] with your surgeon Dr. Weber Cooks - [phone] Address: [address], [place], [place] [address]

## 2022-10-21 NOTE — Anesthesia Postprocedure Evaluation (Signed)
Anesthesia Post Note  Patient: Meghan Bowman  Procedure(s) Performed: HARDWARE REMOVAL (Left)     Patient location during evaluation: PACU Anesthesia Type: General Level of consciousness: awake and alert Pain management: pain level controlled Vital Signs Assessment: post-procedure vital signs reviewed and stable Respiratory status: spontaneous breathing, nonlabored ventilation and respiratory function stable Cardiovascular status: blood pressure returned to baseline Postop Assessment: no apparent nausea or vomiting Anesthetic complications: no   No notable events documented.  Last Vitals:  Vitals:   10/20/22 1700 10/20/22 1721  BP: 102/65 109/78  Pulse: 88 85  Resp: (!) 22 19  Temp:    SpO2: 96% 99%    Last Pain:  Vitals:   10/20/22 1721  TempSrc:   PainSc: 6                  Shanda Howells

## 2022-10-22 ENCOUNTER — Encounter (HOSPITAL_COMMUNITY): Payer: Self-pay | Admitting: Orthopedic Surgery

## 2022-11-20 ENCOUNTER — Other Ambulatory Visit: Payer: Self-pay

## 2022-11-20 ENCOUNTER — Emergency Department (HOSPITAL_COMMUNITY)
Admission: EM | Admit: 2022-11-20 | Discharge: 2022-11-21 | Discharge status: Against Medical Advice | Payer: Medicaid Other | Attending: Emergency Medicine | Admitting: Emergency Medicine

## 2022-11-20 ENCOUNTER — Encounter (HOSPITAL_COMMUNITY): Payer: Self-pay | Admitting: Emergency Medicine

## 2022-11-20 DIAGNOSIS — Z5321 Procedure and treatment not carried out due to patient leaving prior to being seen by health care provider: Secondary | ICD-10-CM | POA: Insufficient documentation

## 2022-11-20 DIAGNOSIS — M549 Dorsalgia, unspecified: Secondary | ICD-10-CM | POA: Insufficient documentation

## 2022-11-20 LAB — CBC
HCT: 36.8 % (ref 36.0–46.0)
Hemoglobin: 12.1 g/dL (ref 12.0–15.0)
MCH: 30.4 pg (ref 26.0–34.0)
MCHC: 32.9 g/dL (ref 30.0–36.0)
MCV: 92.5 fL (ref 80.0–100.0)
Platelets: 202 10*3/uL (ref 150–400)
RBC: 3.98 MIL/uL (ref 3.87–5.11)
RDW: 12.3 % (ref 11.5–15.5)
WBC: 8.9 10*3/uL (ref 4.0–10.5)
nRBC: 0 % (ref 0.0–0.2)

## 2022-11-20 LAB — BASIC METABOLIC PANEL
Anion gap: 9 (ref 5–15)
BUN: 6 mg/dL — ABNORMAL LOW (ref 8–23)
CO2: 23 mmol/L (ref 22–32)
Calcium: 9.2 mg/dL (ref 8.9–10.3)
Chloride: 108 mmol/L (ref 98–111)
Creatinine, Ser: 0.64 mg/dL (ref 0.44–1.00)
GFR, Estimated: >60 mL/min (ref >60)
Glucose, Bld: 90 mg/dL (ref 70–99)
Potassium: 3 mmol/L — ABNORMAL LOW (ref 3.5–5.1)
Sodium: 140 mmol/L (ref 135–145)

## 2022-11-20 MED ORDER — OXYCODONE-ACETAMINOPHEN 5-325 MG PO TABS
1.0000 | ORAL_TABLET | ORAL | Status: DC | PRN
  Administered 2022-11-20: 1 via ORAL
  Filled 2022-11-20: qty 1

## 2022-11-20 NOTE — ED Triage Notes (Signed)
Pt presents feeling that she pulled something in her left back while bathing her grandchild last night.  She is also concerned about her hx of kidney failure with the back pain as well and would like her kidney function checked.  Pt has had significant surgery for left femur fracture and has had infection around that hardware that is ongoing

## 2022-11-20 NOTE — ED Notes (Signed)
Pt came to EMT window to notify this tech that she was going to be leaving and would come back if worsen or when it slows down. Notified Lawyer

## 2023-02-09 ENCOUNTER — Encounter: Payer: Self-pay | Admitting: Physical Medicine & Rehabilitation

## 2023-02-28 ENCOUNTER — Encounter: Payer: Medicaid Other | Admitting: Physical Medicine & Rehabilitation

## 2023-03-23 ENCOUNTER — Other Ambulatory Visit: Payer: Self-pay

## 2023-03-23 ENCOUNTER — Emergency Department (HOSPITAL_COMMUNITY)
Admission: EM | Admit: 2023-03-23 | Discharge: 2023-03-23 | Disposition: A | Discharge status: Home / Self Care | Payer: Medicaid Other

## 2023-03-23 ENCOUNTER — Encounter (HOSPITAL_COMMUNITY): Payer: Self-pay

## 2023-03-23 ENCOUNTER — Emergency Department (HOSPITAL_COMMUNITY): Payer: Medicaid Other

## 2023-03-23 DIAGNOSIS — M79671 Pain in right foot: Secondary | ICD-10-CM | POA: Diagnosis present

## 2023-03-23 DIAGNOSIS — Z7901 Long term (current) use of anticoagulants: Secondary | ICD-10-CM | POA: Insufficient documentation

## 2023-03-23 DIAGNOSIS — I10 Essential (primary) hypertension: Secondary | ICD-10-CM | POA: Insufficient documentation

## 2023-03-23 DIAGNOSIS — J45909 Unspecified asthma, uncomplicated: Secondary | ICD-10-CM | POA: Insufficient documentation

## 2023-03-23 MED ORDER — ACETAMINOPHEN 325 MG PO TABS
650.0000 mg | ORAL_TABLET | ORAL | Status: AC
  Administered 2023-03-23: 650 mg via ORAL
  Filled 2023-03-23: qty 2

## 2023-03-23 NOTE — Discharge Instructions (Signed)
 Please continue taking Tylenol at home for pain every 6 hours.  Utilize crutches that you state that you have in your possession.  Please follow-up with your orthopedic doctor for further management and care.  Return with any new or worsening symptoms.

## 2023-03-23 NOTE — ED Provider Notes (Signed)
 [place] EMERGENCY DEPARTMENT AT [facility] Provider Note   CSN: [id number] Arrival date & time: 03/23/23  1150     History  Chief Complaint  Patient presents with   Foot Pain    Meghan Bowman is a 63 y.o. female with medical history of PTSD, migraines, hypertension, headaches, depression, asthma, alcohol abuse, drug abuse.  Presents for evaluation of right foot pain. Patient reports that yesterday she was using the bathroom.  Reports that when she got up she had her foot caught between the toilet and the wall causing her to fall.  She denies any preceding chest pain or shortness of breath prior to the fall.  Denies hitting her head during this event.  States that she has had pain to the bottom of her right foot as well as the lateral portion of her right foot since the event.  Reports history of surgery on the right foot.  States she is been taking Tylenol at home which would not allow fully alleviate symptoms.  Reports that she is having issues ambulating.   Foot Pain       Home Medications Prior to Admission medications   Medication Sig Start Date End Date Taking? Authorizing Provider  albuterol (VENTOLIN HFA) 108 (90 Base) MCG/ACT inhaler Inhale 2 puffs into the lungs every 6 (six) hours as needed for wheezing or shortness of breath.    [provider]  apixaban (ELIQUIS) 2.5 MG TABS tablet Take 1 tablet (2.5 mg total) by mouth 2 (two) times daily. 10/20/22   Cecil Cobbs, PA-C  atorvastatin (LIPITOR) 40 MG tablet Take 40 mg by mouth daily. 08/18/18   [provider]  budesonide-formoterol (SYMBICORT) 80-4.5 MCG/ACT inhaler Inhale 2 puffs into the lungs 2 (two) times daily as needed (wheezing).    [provider]  diclofenac Sodium (VOLTAREN) 1 % GEL Apply 2 g topically 4 (four) times daily. Patient not taking: Reported on 10/06/2022 12/31/21   Angiulli, Mcarthur Rossetti, PA-C  fluocinolone (SYNALAR) 0.01 % external solution Apply topically 2  (two) times daily. For use in hair Patient not taking: Reported on 06/24/2022 06/07/22   Prosperi, Christian H, PA-C  fluticasone (FLONASE) 50 MCG/ACT nasal spray Place 2 sprays into both nostrils daily as needed for allergies. 11/27/19   [provider]  Fluticasone-Salmeterol (ADVAIR DISKUS IN) Inhale 2 puffs into the lungs 2 (two) times daily as needed (wheezing/SOB/allergies).    [provider]  HYDROcodone-Acetaminophen 10-300 MG TABS Take 1 tablet by mouth every 4 (four) hours as needed. 10/20/22   Cecil Cobbs, PA-C  levocetirizine (XYZAL) 5 MG tablet Take 5 mg by mouth daily as needed for allergies.    [provider]  Menthol, Topical Analgesic, (BIOFREEZE EX) Apply 1 Application topically daily as needed (pain).    [provider]  metoprolol succinate (TOPROL-XL) 25 MG 24 hr tablet Take 1 tablet (25 mg total) by mouth daily. Patient taking differently: Take 25 mg by mouth at bedtime. 12/31/21   Angiulli, Mcarthur Rossetti, PA-C  nicotine (NICODERM CQ - DOSED IN MG/24 HOURS) 21 mg/24hr patch 21 mg patch daily for two weeks then 14 mg patch daily for three weeks then 7 mg patch daily for 3 weeks and stop Patient not taking: Reported on 06/24/2022 12/31/21   Angiulli, Mcarthur Rossetti, PA-C  nitroGLYCERIN (NITROSTAT) 0.4 MG SL tablet Place 0.4 mg under the tongue every 5 (five) minutes as needed for chest pain.    [provider]  ondansetron (  ZOFRAN-ODT) 8 MG disintegrating tablet Take 8 mg by mouth every 8 (eight) hours as needed for nausea or vomiting.    [provider]  pantoprazole (PROTONIX) 40 MG tablet Take 1 tablet (40 mg total) by mouth daily. 06/26/22   Marinda Elk, MD  polyethylene glycol (MIRALAX / GLYCOLAX) 17 g packet Take 17 g by mouth daily. Patient not taking: Reported on 06/24/2022 12/25/21   Lorin Glass, MD  polyethylene glycol (MIRALAX) 17 g packet Take 17 g by mouth daily. 10/20/22   Cecil Cobbs, PA-C  sertraline  (ZOLOFT) 100 MG tablet Take 1 tablet (100 mg total) by mouth at bedtime. 12/31/21   Angiulli, Mcarthur Rossetti, PA-C  tobramycin (TOBREX) 0.3 % ophthalmic solution Place 1 drop into the left eye See admin instructions. Instill 1 drop to left eye four times a day the day before injection and the day after four times a day 08/26/20   [provider]  triamcinolone cream (KENALOG) 0.5 % Apply 1 Application topically daily as needed (itching). 06/11/22   [provider]  Vitamin D, Ergocalciferol, (DRISDOL) 1.25 MG (50000 UNIT) CAPS capsule Take 1 capsule (50,000 Units total) by mouth every 7 (seven) days. 12/31/21   Angiulli, Mcarthur Rossetti, PA-C      Allergies    Ibuprofen, Other, and Tape    Review of Systems   Review of Systems  Musculoskeletal:  Positive for myalgias.  All other systems reviewed and are negative.   Physical Exam Updated Vital Signs BP (!) 141/100   Pulse 82   Temp 97.9 F (36.6 C) (Oral)   Resp 18   Ht 5\' 5"  (1.651 m)   Wt 79.4 kg   SpO2 99%   BMI 29.12 kg/m  Physical Exam Vitals and nursing note reviewed.  Constitutional:      General: She is not in acute distress.    Appearance: She is well-developed.  HENT:     Head: Normocephalic and atraumatic.  Eyes:     Conjunctiva/sclera: Conjunctivae normal.  Cardiovascular:     Rate and Rhythm: Normal rate and regular rhythm.     Heart sounds: No murmur heard. Pulmonary:     Effort: Pulmonary effort is normal. No respiratory distress.     Breath sounds: Normal breath sounds.  Abdominal:     Palpations: Abdomen is soft.     Tenderness: There is no abdominal tenderness.  Musculoskeletal:        General: No swelling.     Cervical back: Neck supple.       Feet:     Comments: No deformity noted to patient right foot.  2+ DP pulse.  Patient neurovascularly intact.  Patient has tenderness to the lateral portion of her right foot.  Also has tenderness to the plantar surface of her right foot.  Full range of motion  appreciated both actively and passively.  Skin:    General: Skin is warm and dry.     Capillary Refill: Capillary refill takes less than 2 seconds.  Neurological:     Mental Status: She is alert.  Psychiatric:        Mood and Affect: Mood normal.     ED Results / Procedures / Treatments   Labs (all labs ordered are listed, but only abnormal results are displayed) Labs Reviewed - No data to display  EKG None  Radiology DG Foot Complete Right Result Date: 03/23/2023 CLINICAL DATA:  Injury right foot yesterday. EXAM: RIGHT FOOT COMPLETE - 3+ VIEW  COMPARISON:  Right great toe radiographs 07/08/2017 FINDINGS: There is diffuse decreased bone mineralization. New screw overlying the great toe metatarsal head. There appear to be postsurgical changes of new distal great toe metatarsal shaft osteotomy compared to 07/08/2016. The great toe metatarsophalangeal angle measures 17 degrees, decreased from 31 degrees previously. Unchanged chronic attenuation of the transverse dimension of the distal aspect of the distal phalanx of the great toe. Mild-to-moderate interphalangeal joint space narrowing diffusely. Mild great toe metatarsophalangeal joint space narrowing and peripheral osteophytosis. Mild-to-moderate second and mild third through fifth tarsometatarsal joint space narrowing, subchondral sclerosis, and peripheral osteophytosis. Mild chronic enthesopathic change at the Achilles insertion on the calcaneus. No acute fracture or dislocation. IMPRESSION: 1. No acute fracture. 2. Postsurgical changes of new distal great toe metatarsal shaft osteotomy compared to 07/08/2017. The great toe metatarsophalangeal angle measures 17 degrees, decreased from 31 degrees previously (interval decreased hallux valgus). 3. 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 Electronically Signed   By: Neita Garnet M.D.   On: 03/23/2023 12:55    Procedures Procedures    Medications Ordered in ED Medications  acetaminophen (TYLENOL) tablet 650 mg (has no administration in time range)    ED Course/ Medical Decision Making/ A&P  Medical Decision Making Amount and/or Complexity of Data Reviewed Radiology: ordered.   63 year old female presents for evaluation of right foot pain.  Please see HPI for further details.  On examination the patient endorses tenderness to the lateral and medial portions of her right foot.  Also endorsing tenderness to plantar portion of her right foot.  Denies history of diabetes.  States that she typically has pain in the bottom of her foot, reports numerous surgeries on her right foot in the past.  Has no obvious deformity on examination.  She has full range of motion of her ankle actively and passively.  2+ DP pulse into the right foot.  Brisk cap refill.  Will assess with x-ray imaging.  Will provide patient Tylenol for pain.  X-ray imaging of patient right foot shows no acute fracture.  There are postsurgical changes in the distal great toe of the metatarsal shaft osteotomy compared to 07/08/2017.  Great toe metatarsophalangeal angle measures 17 degrees compared to 31 degrees previously.  No acute changes noted.  Patient provided with postop shoe.  Patient able to ambulate with postop shoe.  Patient will follow-up with orthopedic doctor.  Advised to take ibuprofen or Tylenol in the interim.  Advised to elevate at night.  Encouraged to return to ED with any new or worsening symptoms.  Final Clinical Impression(s) / ED Diagnoses Final diagnoses:  Foot pain, right    Rx / DC Orders ED Discharge Orders     None         Clent Ridges 03/23/23 1349    Durwin Glaze, MD 03/25/23 [phone]

## 2023-03-23 NOTE — ED Triage Notes (Signed)
 Injured right foot yesterday. Pt is able to bear weight, but states it hurts worse when applying pressure.

## 2023-03-23 NOTE — Progress Notes (Signed)
 Orthopedic Tech Progress Note Patient Details:  Meghan Bowman [birth year] [id number]  Ortho Devices Type of Ortho Device: Postop shoe/boot Ortho Device/Splint Location: RLE Ortho Device/Splint Interventions: Application   Post Interventions Patient Tolerated: Well  [name] 03/23/2023, 1:46 PM

## 2023-10-11 ENCOUNTER — Other Ambulatory Visit: Payer: Self-pay | Admitting: Family Medicine

## 2023-10-11 DIAGNOSIS — Z1231 Encounter for screening mammogram for malignant neoplasm of breast: Secondary | ICD-10-CM

## 2023-10-12 ENCOUNTER — Ambulatory Visit
Admission: RE | Admit: 2023-10-12 | Discharge: 2023-10-12 | Disposition: A | Discharge status: Home / Self Care | Source: Ambulatory Visit | Attending: Family Medicine | Admitting: Family Medicine

## 2023-10-12 ENCOUNTER — Other Ambulatory Visit (HOSPITAL_COMMUNITY): Payer: Self-pay

## 2023-10-12 DIAGNOSIS — Z1231 Encounter for screening mammogram for malignant neoplasm of breast: Secondary | ICD-10-CM
